# Patient Record
Sex: Male | Born: 1940 | Race: Black or African American | Hispanic: No | Smoking: Never smoker
Health system: Southern US, Community
[De-identification: ages and names within clinical notes are randomized; demographics above are authoritative.]

## PROBLEM LIST (undated history)

## (undated) DIAGNOSIS — R2689 Other abnormalities of gait and mobility: Secondary | ICD-10-CM

## (undated) DIAGNOSIS — I639 Cerebral infarction, unspecified: Secondary | ICD-10-CM

## (undated) DIAGNOSIS — E1140 Type 2 diabetes mellitus with diabetic neuropathy, unspecified: Secondary | ICD-10-CM

## (undated) DIAGNOSIS — Z8739 Personal history of other diseases of the musculoskeletal system and connective tissue: Secondary | ICD-10-CM

## (undated) DIAGNOSIS — I358 Other nonrheumatic aortic valve disorders: Secondary | ICD-10-CM

## (undated) DIAGNOSIS — R519 Headache, unspecified: Secondary | ICD-10-CM

## (undated) DIAGNOSIS — E119 Type 2 diabetes mellitus without complications: Secondary | ICD-10-CM

## (undated) DIAGNOSIS — E7800 Pure hypercholesterolemia, unspecified: Secondary | ICD-10-CM

## (undated) DIAGNOSIS — M1990 Unspecified osteoarthritis, unspecified site: Secondary | ICD-10-CM

## (undated) DIAGNOSIS — H409 Unspecified glaucoma: Secondary | ICD-10-CM

## (undated) DIAGNOSIS — K5730 Diverticulosis of large intestine without perforation or abscess without bleeding: Secondary | ICD-10-CM

## (undated) DIAGNOSIS — K5900 Constipation, unspecified: Secondary | ICD-10-CM

## (undated) DIAGNOSIS — H3530 Unspecified macular degeneration: Secondary | ICD-10-CM

## (undated) DIAGNOSIS — M169 Osteoarthritis of hip, unspecified: Secondary | ICD-10-CM

## (undated) DIAGNOSIS — R51 Headache: Secondary | ICD-10-CM

## (undated) HISTORY — PX: CATARACT EXTRACTION, BILATERAL: SHX1313

## (undated) HISTORY — PX: BACK SURGERY: SHX140

## (undated) HISTORY — PX: LAPAROSCOPIC CHOLECYSTECTOMY: SUR755

## (undated) HISTORY — PX: LUMBAR DISC SURGERY: SHX700

## (undated) HISTORY — PX: APPENDECTOMY: SHX54

## (undated) HISTORY — PX: JOINT REPLACEMENT: SHX530

---

## 2004-11-22 ENCOUNTER — Ambulatory Visit: Payer: Self-pay | Admitting: General Surgery

## 2006-01-27 ENCOUNTER — Ambulatory Visit: Payer: Self-pay | Admitting: General Surgery

## 2006-11-24 ENCOUNTER — Ambulatory Visit: Payer: Self-pay | Admitting: Urology

## 2006-11-24 ENCOUNTER — Other Ambulatory Visit: Payer: Self-pay

## 2006-12-07 ENCOUNTER — Ambulatory Visit: Payer: Self-pay | Admitting: Urology

## 2007-11-26 ENCOUNTER — Ambulatory Visit: Payer: Self-pay | Admitting: Family Medicine

## 2008-11-28 ENCOUNTER — Ambulatory Visit: Payer: Self-pay | Admitting: Gastroenterology

## 2010-12-04 ENCOUNTER — Ambulatory Visit: Payer: Self-pay | Admitting: Family Medicine

## 2013-01-11 ENCOUNTER — Ambulatory Visit: Payer: Self-pay | Admitting: Urology

## 2013-06-23 ENCOUNTER — Ambulatory Visit: Payer: Self-pay | Admitting: Family Medicine

## 2013-09-12 ENCOUNTER — Ambulatory Visit: Payer: Self-pay | Admitting: Gastroenterology

## 2015-04-02 ENCOUNTER — Encounter: Payer: Self-pay | Admitting: *Deleted

## 2015-04-09 ENCOUNTER — Ambulatory Visit: Payer: Self-pay | Admitting: General Surgery

## 2015-05-09 ENCOUNTER — Encounter: Payer: Self-pay | Admitting: *Deleted

## 2015-06-18 ENCOUNTER — Telehealth: Payer: Self-pay | Admitting: Emergency Medicine

## 2015-06-18 MED ORDER — METRONIDAZOLE 500 MG PO TABS: 500.0000 mg | ORAL_TABLET | Freq: Two times a day (BID) | ORAL | Status: DC

## 2015-06-18 NOTE — Telephone Encounter (Signed)
Would like refill on Metronidazole. Having symptoms again. Would like to know if he can get more than 2 day supply.  [facility] [place]

## 2015-06-18 NOTE — Telephone Encounter (Signed)
Called patient back to inform him script has been called in for Metronodizole at [facility]

## 2015-06-18 NOTE — Telephone Encounter (Signed)
ok 

## 2015-08-01 ENCOUNTER — Encounter: Payer: Self-pay | Admitting: Family Medicine

## 2015-08-01 ENCOUNTER — Ambulatory Visit (INDEPENDENT_AMBULATORY_CARE_PROVIDER_SITE_OTHER): Payer: Commercial Managed Care - HMO | Admitting: Family Medicine

## 2015-08-01 VITALS — BP 130/80 | HR 69 | Temp 97.9°F | Resp 18 | Ht 76.0 in | Wt 228.3 lb

## 2015-08-01 DIAGNOSIS — S39011D Strain of muscle, fascia and tendon of abdomen, subsequent encounter: Secondary | ICD-10-CM | POA: Diagnosis not present

## 2015-08-01 DIAGNOSIS — S39011A Strain of muscle, fascia and tendon of abdomen, initial encounter: Secondary | ICD-10-CM | POA: Insufficient documentation

## 2015-08-01 MED ORDER — TRAMADOL HCL 50 MG PO TABS: 50.0000 mg | ORAL_TABLET | Freq: Three times a day (TID) | ORAL | Status: DC | PRN

## 2015-08-01 NOTE — Progress Notes (Signed)
Name: Harold Sandoval   MRN: [id number]    DOB: March 22, 1941   Date:08/01/2015       Progress Note  Subjective  Chief Complaint  Chief Complaint  Patient presents with  . Abdominal Pain    right side , would like something for pain    HPI  Patient has a long-standing history of right sided abdominal pain. He has been worked up extensively for this problem in the past with no findings except perhaps rectus muscle discomfort. He's been given tramadol and Norco in the past for that and wishes to have refill for same as he is about to go on a trip to the beach.  NSAIDs and Tylenol have not been effective believe his symptomatology is exactly the same as in the past. Workup has included a colonoscopy in 2009 which was unremarkable. He had been working extensively on 3 different yards of the last few days that may have precipitated the recurrence of this rectus muscle strain.  History reviewed. No pertinent past medical history.  Social History  Substance Use Topics  . Smoking status: Never Smoker   . Smokeless tobacco: Not on file  . Alcohol Use: No     Current outpatient prescriptions:  .  AZOPT 1 % ophthalmic suspension, instill 1 drop into both eyes twice a day, Disp: , Rfl: 0 .  brimonidine (ALPHAGAN) 0.2 % ophthalmic solution, , Disp: , Rfl: 0 .  latanoprost (XALATAN) 0.005 % ophthalmic solution, , Disp: , Rfl: 0  No Known Allergies  Review of Systems  Constitutional: Negative for fever, chills and weight loss.  Gastrointestinal: Positive for abdominal pain. Negative for heartburn, nausea, diarrhea, constipation, blood in stool and melena.     Objective  Filed Vitals:   08/01/15 0953  BP: 130/80  Pulse: 69  Temp: 97.9 F (36.6 C)  TempSrc: Oral  Resp: 18  Height:  (1.93 m)  Weight: 228 lb 4.8 oz (103.556 kg)  SpO2: 96%     Physical Exam  Constitutional: He is well-developed, well-nourished, and in no distress.  HENT:  Head: Normocephalic.  Eyes: Pupils  are equal, round, and reactive to light.  Cardiovascular: Normal rate.   Pulmonary/Chest: Effort normal.  Abdominal: Soft. Bowel sounds are normal. Tenderness: tender along the lateral aspect of the right rectus muscle reproduce the patient's pain.      Assessment & Plan   1. Strain of rectus abdominis muscle, subsequent encounter  - traMADol (ULTRAM) 50 MG tablet; Take 1 tablet (50 mg total) by mouth every 8 (eight) hours as needed.  Dispense: 30 tablet; Refill: 0

## 2015-09-25 ENCOUNTER — Ambulatory Visit (INDEPENDENT_AMBULATORY_CARE_PROVIDER_SITE_OTHER): Payer: Commercial Managed Care - HMO | Admitting: Family Medicine

## 2015-09-25 ENCOUNTER — Encounter: Payer: Self-pay | Admitting: Family Medicine

## 2015-09-25 VITALS — BP 118/78 | HR 127 | Temp 97.4°F | Resp 16 | Ht 76.0 in | Wt 226.3 lb

## 2015-09-25 DIAGNOSIS — J329 Chronic sinusitis, unspecified: Secondary | ICD-10-CM | POA: Diagnosis not present

## 2015-09-25 DIAGNOSIS — J40 Bronchitis, not specified as acute or chronic: Secondary | ICD-10-CM

## 2015-09-25 MED ORDER — FLUTICASONE PROPIONATE 50 MCG/ACT NA SUSP: 2.0000 | Freq: Every day | NASAL | Status: DC

## 2015-09-25 MED ORDER — AMOXICILLIN-POT CLAVULANATE 875-125 MG PO TABS: 1.0000 | ORAL_TABLET | Freq: Two times a day (BID) | ORAL | Status: DC

## 2015-09-25 MED ORDER — PREDNISONE 20 MG PO TABS: 20.0000 mg | ORAL_TABLET | Freq: Every day | ORAL | Status: DC

## 2015-09-25 NOTE — Progress Notes (Signed)
Name: Harold Sandoval   MRN: [id number]    DOB: [birth year]   Date:09/25/2015       Progress Note  Subjective  Chief Complaint  Chief Complaint  Patient presents with  . Sinus Problem    pt having sinus issues for 4 days  . Wheezing    and chest congestion    HPI  Sinusitis  Patient presents with greater than 7 day history of nasal congestion and drainage which is purulent in color. There is tenderness over the sinuses. There has been fever to nothing along with some associated chills on occasion as well as night sweats. Usage of over-the-counter medications is not been affected. There is also accompanying cough productive of purulent sputum.  Bronchitis  Patient presents with a greater than 1 week history of cough productive of purulent sputum. The cough is irritating and keep the patient awake at night. There has no fever he has has chills chills.  Over-the-counter meds are not completely effective.     History reviewed. No pertinent past medical history.  Social History  Substance Use Topics  . Smoking status: Never Smoker   . Smokeless tobacco: Not on file  . Alcohol Use: No     Current outpatient prescriptions:  .  AZOPT 1 % ophthalmic suspension, instill 1 drop into both eyes twice a day, Disp: , Rfl: 0 .  brimonidine (ALPHAGAN) 0.2 % ophthalmic solution, , Disp: , Rfl: 0 .  latanoprost (XALATAN) 0.005 % ophthalmic solution, , Disp: , Rfl: 0  No Known Allergies  Review of Systems  Constitutional: Positive for chills and malaise/fatigue. Negative for fever and weight loss.  HENT: Positive for congestion. Negative for hearing loss, sore throat and tinnitus.        Sinus pressure and pain  Eyes: Negative for blurred vision, double vision and redness.  Respiratory: Positive for cough and shortness of breath. Negative for hemoptysis.   Cardiovascular: Negative for chest pain, palpitations, orthopnea, claudication and leg swelling.  Gastrointestinal: Negative for  heartburn, nausea, vomiting, diarrhea, constipation and blood in stool.  Genitourinary: Negative for dysuria, urgency, frequency and hematuria.  Musculoskeletal: Negative for myalgias, back pain, joint pain, falls and neck pain.  Skin: Negative for itching.  Neurological: Negative for dizziness, tingling, tremors, focal weakness, seizures, loss of consciousness, weakness and headaches.  Endo/Heme/Allergies: Does not bruise/bleed easily.  Psychiatric/Behavioral: Negative for depression and substance abuse. The patient is not nervous/anxious and does not have insomnia.      Objective  Filed Vitals:   09/25/15 1324  BP: 118/78  Pulse: 127  Temp: 97.4 F (36.3 C)  Resp: 16  Height: 6\' 4"  (1.93 m)  Weight: 226 lb 5 oz (102.655 kg)  SpO2: 96%     Physical Exam  Constitutional: He is oriented to person, place, and time and well-developed, well-nourished, and in no distress.  Patient in mild distress  HENT:  Head: Normocephalic.  Tenderness over the frontal and maxillary sinuses. Nasal turbinates are swollen with clear discharge.  Eyes: EOM are normal. Pupils are equal, round, and reactive to light.  Neck: Normal range of motion. Neck supple. No thyromegaly present.  Cardiovascular: Normal rate, regular rhythm and normal heart sounds.   No murmur heard. Pulmonary/Chest: Effort normal and breath sounds normal. No respiratory distress. He has no wheezes.  Musculoskeletal: Normal range of motion. He exhibits no edema.  Lymphadenopathy:    He has no cervical adenopathy.  Neurological: He is alert and oriented to person, place, and time.  No cranial nerve deficit. Gait normal. Coordination normal.  Skin: Skin is warm and dry. No rash noted.  Psychiatric: Affect and judgment normal.      Assessment & Plan  1. Bronchitis With accompanying sweats and chills - predniSONE (DELTASONE) 20 MG tablet; Take 1 tablet (20 mg total) by mouth daily with breakfast.  Dispense: 10 tablet; Refill:  0 - amoxicillin-clavulanate (AUGMENTIN) 875-125 MG tablet; Take 1 tablet by mouth 2 (two) times daily.  Dispense: 20 tablet; Refill: 0 - fluticasone (FLONASE) 50 MCG/ACT nasal spray; Place 2 sprays into both nostrils daily.  Dispense: 16 g; Refill: 6  2. Sinusitis, unspecified chronicity, unspecified location With accompanying sweats and chills - predniSONE (DELTASONE) 20 MG tablet; Take 1 tablet (20 mg total) by mouth daily with breakfast.  Dispense: 10 tablet; Refill: 0 - amoxicillin-clavulanate (AUGMENTIN) 875-125 MG tablet; Take 1 tablet by mouth 2 (two) times daily.  Dispense: 20 tablet; Refill: 0 - fluticasone (FLONASE) 50 MCG/ACT nasal spray; Place 2 sprays into both nostrils daily.  Dispense: 16 g; Refill: 6

## 2015-10-15 ENCOUNTER — Telehealth: Payer: Self-pay | Admitting: Family Medicine

## 2015-10-15 DIAGNOSIS — J40 Bronchitis, not specified as acute or chronic: Secondary | ICD-10-CM

## 2015-10-15 DIAGNOSIS — J329 Chronic sinusitis, unspecified: Secondary | ICD-10-CM

## 2015-10-15 NOTE — Telephone Encounter (Signed)
Sinuses have not cleared up from the other week and would like to know if you are able to call in another round of antibotics to his pharmacy. Tried to schedule him an appointment today but [place]. Also offered him appointment for tomorrow but he wanted me to ask you this first.

## 2015-10-16 ENCOUNTER — Telehealth: Payer: Self-pay

## 2015-10-16 ENCOUNTER — Encounter: Payer: Self-pay | Admitting: Family Medicine

## 2015-10-16 NOTE — Telephone Encounter (Signed)
Augmentin 875 twice a day for 10 days

## 2015-10-16 NOTE — Telephone Encounter (Signed)
Called pt and notified him that I have obtained [facility] for him to see Dr. Allyne GeeSanders.  [place] # [id number]   ICD-10: H35.059 Start - 10/22/15 Expires - 04/19/2016

## 2015-10-17 MED ORDER — AMOXICILLIN-POT CLAVULANATE 875-125 MG PO TABS: 1.0000 | ORAL_TABLET | Freq: Two times a day (BID) | ORAL | Status: DC

## 2015-10-17 NOTE — Telephone Encounter (Signed)
Patient informed and thanks you °

## 2015-10-17 NOTE — Telephone Encounter (Signed)
Medication has been sent to pharmacy.  °

## 2015-10-24 ENCOUNTER — Other Ambulatory Visit: Payer: Self-pay | Admitting: Family Medicine

## 2015-10-24 MED ORDER — METRONIDAZOLE 500 MG PO TABS: 500.0000 mg | ORAL_TABLET | Freq: Two times a day (BID) | ORAL | Status: DC

## 2015-10-24 NOTE — Telephone Encounter (Signed)
Patient called and ask for refill on Metronidazole. Having symptoms again. Scripy sent to Pharmacy

## 2015-11-24 DIAGNOSIS — I639 Cerebral infarction, unspecified: Secondary | ICD-10-CM

## 2015-11-24 HISTORY — DX: Cerebral infarction, unspecified: I63.9

## 2015-12-14 DIAGNOSIS — I358 Other nonrheumatic aortic valve disorders: Secondary | ICD-10-CM | POA: Diagnosis not present

## 2015-12-18 ENCOUNTER — Other Ambulatory Visit: Payer: Self-pay | Admitting: Family Medicine

## 2015-12-27 DIAGNOSIS — I639 Cerebral infarction, unspecified: Secondary | ICD-10-CM | POA: Diagnosis not present

## 2016-01-11 DIAGNOSIS — H1859 Other hereditary corneal dystrophies: Secondary | ICD-10-CM | POA: Diagnosis not present

## 2016-01-11 DIAGNOSIS — H401133 Primary open-angle glaucoma, bilateral, severe stage: Secondary | ICD-10-CM | POA: Diagnosis not present

## 2016-01-11 DIAGNOSIS — Z961 Presence of intraocular lens: Secondary | ICD-10-CM | POA: Diagnosis not present

## 2016-01-15 DIAGNOSIS — I358 Other nonrheumatic aortic valve disorders: Secondary | ICD-10-CM | POA: Diagnosis not present

## 2016-01-15 DIAGNOSIS — E119 Type 2 diabetes mellitus without complications: Secondary | ICD-10-CM | POA: Diagnosis not present

## 2016-01-15 DIAGNOSIS — E785 Hyperlipidemia, unspecified: Secondary | ICD-10-CM | POA: Diagnosis not present

## 2016-01-15 DIAGNOSIS — Z8673 Personal history of transient ischemic attack (TIA), and cerebral infarction without residual deficits: Secondary | ICD-10-CM | POA: Diagnosis not present

## 2016-01-17 DIAGNOSIS — E7800 Pure hypercholesterolemia, unspecified: Secondary | ICD-10-CM | POA: Diagnosis not present

## 2016-01-17 DIAGNOSIS — N529 Male erectile dysfunction, unspecified: Secondary | ICD-10-CM | POA: Diagnosis not present

## 2016-01-17 DIAGNOSIS — E119 Type 2 diabetes mellitus without complications: Secondary | ICD-10-CM | POA: Diagnosis not present

## 2016-01-30 DIAGNOSIS — H43811 Vitreous degeneration, right eye: Secondary | ICD-10-CM | POA: Diagnosis not present

## 2016-01-30 DIAGNOSIS — H353221 Exudative age-related macular degeneration, left eye, with active choroidal neovascularization: Secondary | ICD-10-CM | POA: Diagnosis not present

## 2016-01-30 DIAGNOSIS — H353111 Nonexudative age-related macular degeneration, right eye, early dry stage: Secondary | ICD-10-CM | POA: Diagnosis not present

## 2016-02-01 DIAGNOSIS — E782 Mixed hyperlipidemia: Secondary | ICD-10-CM | POA: Diagnosis not present

## 2016-02-01 DIAGNOSIS — Z6826 Body mass index (BMI) 26.0-26.9, adult: Secondary | ICD-10-CM | POA: Diagnosis not present

## 2016-02-01 DIAGNOSIS — Z8673 Personal history of transient ischemic attack (TIA), and cerebral infarction without residual deficits: Secondary | ICD-10-CM | POA: Diagnosis not present

## 2016-02-01 DIAGNOSIS — Z0000 Encounter for general adult medical examination without abnormal findings: Secondary | ICD-10-CM | POA: Diagnosis not present

## 2016-02-01 DIAGNOSIS — H409 Unspecified glaucoma: Secondary | ICD-10-CM | POA: Diagnosis not present

## 2016-02-08 DIAGNOSIS — I358 Other nonrheumatic aortic valve disorders: Secondary | ICD-10-CM | POA: Diagnosis not present

## 2016-02-08 DIAGNOSIS — Z8673 Personal history of transient ischemic attack (TIA), and cerebral infarction without residual deficits: Secondary | ICD-10-CM | POA: Diagnosis not present

## 2016-04-09 DIAGNOSIS — H43811 Vitreous degeneration, right eye: Secondary | ICD-10-CM | POA: Diagnosis not present

## 2016-04-09 DIAGNOSIS — H353221 Exudative age-related macular degeneration, left eye, with active choroidal neovascularization: Secondary | ICD-10-CM | POA: Diagnosis not present

## 2016-04-09 DIAGNOSIS — H353111 Nonexudative age-related macular degeneration, right eye, early dry stage: Secondary | ICD-10-CM | POA: Diagnosis not present

## 2016-04-10 DIAGNOSIS — E119 Type 2 diabetes mellitus without complications: Secondary | ICD-10-CM | POA: Diagnosis not present

## 2016-04-14 DIAGNOSIS — Z7901 Long term (current) use of anticoagulants: Secondary | ICD-10-CM | POA: Diagnosis not present

## 2016-04-14 DIAGNOSIS — I358 Other nonrheumatic aortic valve disorders: Secondary | ICD-10-CM | POA: Diagnosis not present

## 2016-04-14 DIAGNOSIS — Z8679 Personal history of other diseases of the circulatory system: Secondary | ICD-10-CM | POA: Diagnosis not present

## 2016-04-15 DIAGNOSIS — Z7901 Long term (current) use of anticoagulants: Secondary | ICD-10-CM | POA: Diagnosis not present

## 2016-04-15 DIAGNOSIS — I358 Other nonrheumatic aortic valve disorders: Secondary | ICD-10-CM | POA: Diagnosis not present

## 2016-04-15 DIAGNOSIS — Z8679 Personal history of other diseases of the circulatory system: Secondary | ICD-10-CM | POA: Diagnosis not present

## 2016-04-17 DIAGNOSIS — E7800 Pure hypercholesterolemia, unspecified: Secondary | ICD-10-CM | POA: Diagnosis not present

## 2016-04-17 DIAGNOSIS — Z125 Encounter for screening for malignant neoplasm of prostate: Secondary | ICD-10-CM | POA: Diagnosis not present

## 2016-04-17 DIAGNOSIS — E119 Type 2 diabetes mellitus without complications: Secondary | ICD-10-CM | POA: Diagnosis not present

## 2016-04-17 DIAGNOSIS — N529 Male erectile dysfunction, unspecified: Secondary | ICD-10-CM | POA: Diagnosis not present

## 2016-04-30 DIAGNOSIS — L820 Inflamed seborrheic keratosis: Secondary | ICD-10-CM | POA: Diagnosis not present

## 2016-04-30 DIAGNOSIS — L819 Disorder of pigmentation, unspecified: Secondary | ICD-10-CM | POA: Diagnosis not present

## 2016-05-12 ENCOUNTER — Observation Stay
Admission: EM | Admit: 2016-05-12 | Discharge: 2016-05-13 | Disposition: A | Discharge status: Home / Self Care | Payer: PPO | Attending: Specialist | Admitting: Specialist

## 2016-05-12 ENCOUNTER — Encounter: Payer: Self-pay | Admitting: Emergency Medicine

## 2016-05-12 ENCOUNTER — Emergency Department: Payer: PPO

## 2016-05-12 DIAGNOSIS — Z79899 Other long term (current) drug therapy: Secondary | ICD-10-CM | POA: Insufficient documentation

## 2016-05-12 DIAGNOSIS — Z7901 Long term (current) use of anticoagulants: Secondary | ICD-10-CM | POA: Diagnosis not present

## 2016-05-12 DIAGNOSIS — Z7951 Long term (current) use of inhaled steroids: Secondary | ICD-10-CM | POA: Diagnosis not present

## 2016-05-12 DIAGNOSIS — M542 Cervicalgia: Secondary | ICD-10-CM | POA: Diagnosis present

## 2016-05-12 DIAGNOSIS — R079 Chest pain, unspecified: Secondary | ICD-10-CM

## 2016-05-12 DIAGNOSIS — E119 Type 2 diabetes mellitus without complications: Secondary | ICD-10-CM | POA: Insufficient documentation

## 2016-05-12 DIAGNOSIS — Z8673 Personal history of transient ischemic attack (TIA), and cerebral infarction without residual deficits: Secondary | ICD-10-CM | POA: Insufficient documentation

## 2016-05-12 DIAGNOSIS — Z8679 Personal history of other diseases of the circulatory system: Secondary | ICD-10-CM | POA: Diagnosis not present

## 2016-05-12 DIAGNOSIS — J310 Chronic rhinitis: Secondary | ICD-10-CM | POA: Insufficient documentation

## 2016-05-12 DIAGNOSIS — R200 Anesthesia of skin: Secondary | ICD-10-CM | POA: Diagnosis not present

## 2016-05-12 DIAGNOSIS — R208 Other disturbances of skin sensation: Secondary | ICD-10-CM | POA: Diagnosis present

## 2016-05-12 DIAGNOSIS — R0789 Other chest pain: Principal | ICD-10-CM | POA: Insufficient documentation

## 2016-05-12 DIAGNOSIS — M5414 Radiculopathy, thoracic region: Secondary | ICD-10-CM | POA: Diagnosis present

## 2016-05-12 DIAGNOSIS — H409 Unspecified glaucoma: Secondary | ICD-10-CM | POA: Insufficient documentation

## 2016-05-12 DIAGNOSIS — Z7984 Long term (current) use of oral hypoglycemic drugs: Secondary | ICD-10-CM | POA: Diagnosis not present

## 2016-05-12 DIAGNOSIS — E785 Hyperlipidemia, unspecified: Secondary | ICD-10-CM | POA: Insufficient documentation

## 2016-05-12 DIAGNOSIS — I639 Cerebral infarction, unspecified: Secondary | ICD-10-CM | POA: Diagnosis not present

## 2016-05-12 LAB — CBC
HCT: 48.2 % (ref 40.0–52.0)
Hemoglobin: 16.6 g/dL (ref 13.0–18.0)
MCH: 30.7 pg (ref 26.0–34.0)
MCHC: 34.5 g/dL (ref 32.0–36.0)
MCV: 89 fL (ref 80.0–100.0)
Platelets: 184 10*3/uL (ref 150–440)
RBC: 5.41 MIL/uL (ref 4.40–5.90)
RDW: 13.1 % (ref 11.5–14.5)
WBC: 6.9 10*3/uL (ref 3.8–10.6)

## 2016-05-12 LAB — BASIC METABOLIC PANEL WITH GFR
Anion gap: 6 (ref 5–15)
BUN: 15 mg/dL (ref 6–20)
CO2: 31 mmol/L (ref 22–32)
Calcium: 10 mg/dL (ref 8.9–10.3)
Chloride: 105 mmol/L (ref 101–111)
Creatinine, Ser: 1.09 mg/dL (ref 0.61–1.24)
GFR calc Af Amer: >60 mL/min
GFR calc non Af Amer: >60 mL/min
Glucose, Bld: 108 mg/dL — ABNORMAL HIGH (ref 65–99)
Potassium: 4.1 mmol/L (ref 3.5–5.1)
Sodium: 142 mmol/L (ref 135–145)

## 2016-05-12 LAB — GLUCOSE, CAPILLARY: Glucose-Capillary: 176 mg/dL — ABNORMAL HIGH (ref 65–99)

## 2016-05-12 LAB — TROPONIN I
Troponin I: <0.03 ng/mL
Troponin I: <0.03 ng/mL

## 2016-05-12 MED ORDER — BRIMONIDINE TARTRATE 0.2 % OP SOLN
1.0000 [drp] | Freq: Two times a day (BID) | OPHTHALMIC | Status: DC
  Administered 2016-05-12 – 2016-05-13 (×2): 1 [drp] via OPHTHALMIC
  Filled 2016-05-12: qty 5

## 2016-05-12 MED ORDER — ASPIRIN EC 325 MG PO TBEC
325.0000 mg | DELAYED_RELEASE_TABLET | Freq: Every day | ORAL | Status: DC
  Administered 2016-05-13: 325 mg via ORAL
  Filled 2016-05-12: qty 1

## 2016-05-12 MED ORDER — BRINZOLAMIDE 1 % OP SUSP
1.0000 [drp] | Freq: Two times a day (BID) | OPHTHALMIC | Status: DC
  Administered 2016-05-12 – 2016-05-13 (×2): 1 [drp] via OPHTHALMIC
  Filled 2016-05-12: qty 10

## 2016-05-12 MED ORDER — ACETAMINOPHEN 325 MG PO TABS: 650.0000 mg | ORAL_TABLET | ORAL | Status: DC | PRN

## 2016-05-12 MED ORDER — INSULIN ASPART 100 UNIT/ML ~~LOC~~ SOLN
0.0000 [IU] | Freq: Three times a day (TID) | SUBCUTANEOUS | Status: DC
  Filled 2016-05-12: qty 3

## 2016-05-12 MED ORDER — LATANOPROST 0.005 % OP SOLN
1.0000 [drp] | Freq: Every day | OPHTHALMIC | Status: DC
  Administered 2016-05-12: 1 [drp] via OPHTHALMIC
  Filled 2016-05-12: qty 2.5

## 2016-05-12 MED ORDER — INSULIN ASPART 100 UNIT/ML ~~LOC~~ SOLN: 0.0000 [IU] | Freq: Every day | SUBCUTANEOUS | Status: DC

## 2016-05-12 MED ORDER — APIXABAN 5 MG PO TABS
5.0000 mg | ORAL_TABLET | Freq: Two times a day (BID) | ORAL | Status: DC
  Administered 2016-05-12 – 2016-05-13 (×2): 5 mg via ORAL
  Filled 2016-05-12 (×2): qty 1

## 2016-05-12 MED ORDER — ATORVASTATIN CALCIUM 20 MG PO TABS
80.0000 mg | ORAL_TABLET | Freq: Every day | ORAL | Status: DC
  Administered 2016-05-12: 80 mg via ORAL
  Filled 2016-05-12: qty 4

## 2016-05-12 MED ORDER — ALPRAZOLAM 0.25 MG PO TABS: 0.2500 mg | ORAL_TABLET | Freq: Two times a day (BID) | ORAL | Status: DC | PRN

## 2016-05-12 MED ORDER — GI COCKTAIL ~~LOC~~
30.0000 mL | Freq: Four times a day (QID) | ORAL | Status: DC | PRN
  Filled 2016-05-12: qty 30

## 2016-05-12 MED ORDER — MORPHINE SULFATE (PF) 2 MG/ML IV SOLN: 2.0000 mg | INTRAVENOUS | Status: DC | PRN

## 2016-05-12 MED ORDER — ONDANSETRON HCL 4 MG/2ML IJ SOLN: 4.0000 mg | Freq: Four times a day (QID) | INTRAMUSCULAR | Status: DC | PRN

## 2016-05-12 MED ORDER — METOPROLOL TARTRATE 25 MG PO TABS
25.0000 mg | ORAL_TABLET | Freq: Two times a day (BID) | ORAL | Status: DC
  Administered 2016-05-12 – 2016-05-13 (×2): 25 mg via ORAL
  Filled 2016-05-12 (×2): qty 1

## 2016-05-12 MED ORDER — FLUTICASONE PROPIONATE 50 MCG/ACT NA SUSP
2.0000 | Freq: Every day | NASAL | Status: DC | PRN
  Filled 2016-05-12: qty 16

## 2016-05-12 NOTE — ED Notes (Signed)
Pt having off and on left sided cp, states pain radiates to left arm and left neck occasionally as well. Denies cardiac hx. Did have a stroke in December.

## 2016-05-12 NOTE — ED Provider Notes (Signed)
[facility] Emergency Department Provider Note  ____________________________________________  Time seen: Approximately 4:10 PM  I have reviewed the triage vital signs and the nursing notes.   HISTORY  Chief Complaint No chief complaint on file.    HPI Harold Sandoval is a 75 y.o. 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 The patient reports that since his stroke in December, he is having 2-3 episodes daily of a left-sided chest pain just below the nipple that is "sharp and dull." The episodes can occur during the day or at night, at rest, and are not related to positional changes or food. Sometimes the episodes are brief lasting a split-second, and sometimes they last for several hours. He does not have any associated lightheadedness, syncope, palpitations, shortness of breath, diaphoresis or nausea or vomiting. Last night, the patient had a similar episode that that it continued throughout the night and was associated with a pain that went from his neck to his shoulder on the left side and numbness in the left hand. At this time, the patient is symptom-free.   Past Medical History  Diagnosis Date  . Stroke (HCC)   . Diabetes mellitus without complication [facility](HCC)     Patient Active Problem List   Diagnosis Date Noted  . Strain of rectus abdominis muscle 08/01/2015    Past Surgical History  Procedure Laterality Date  . Back surgery    . Appendectomy    . Gallbladder surgery      Current Outpatient Rx  Name  Route  Sig  Dispense  Refill  . amoxicillin-clavulanate (AUGMENTIN) 875-125 MG tablet   Oral   Take 1 tablet by mouth 2 (two) times daily.   20 tablet   0   . AZOPT 1 % ophthalmic suspension      instill 1 drop into both eyes twice a day      0     Dispense as written.   . brimonidine (ALPHAGAN) 0.2 % ophthalmic solution            0   . desonide (DESOWEN) 0.05 % lotion      apply to affected area twice a day   30 mL   1   . fluticasone (FLONASE) 50 MCG/ACT nasal spray   Each Nare   Place 2 sprays into both nostrils daily.   16 g   6   . latanoprost (XALATAN) 0.005 % ophthalmic solution            0   . metroNIDAZOLE (FLAGYL) 500 MG tablet   Oral   Take 1 tablet (500 mg total) by mouth 2 (two) times daily.   14 tablet   0   . predniSONE (DELTASONE) 20 MG tablet   Oral   Take 1 tablet (20 mg total) by mouth daily with breakfast.   10 tablet   0     Allergies Review of patient's allergies indicates no known allergies.  No family history on file.  Social History Social History  Substance Use Topics  . Smoking status: Never Smoker   . Smokeless tobacco: None  . Alcohol Use: No    Review of Systems Constitutional: No fever/chills.  No lightheadedness or sob. Eyes: No visual changes.  No blurred or double vision.. ENT: No sore throat. No congestion or rhinorrhea. Cardiovascular: Positive chest pain. Denies palpitations. Respiratory: Denies shortness of breath.  No cough. Gastrointestinal: No abdominal pain.  No nausea, no vomiting.  No  diarrhea.  No constipation. Genitourinary: Negative for dysuria. Musculoskeletal: Negative for back pain.  No LE swelling Skin: Negative for rash. Neurological: Negative for headaches. No focal numbness, tingling or weakness.   10-point ROS otherwise negative.  ____________________________________________   PHYSICAL EXAM:  VITAL SIGNS: ED Triage Vitals  Enc Vitals Group     BP 05/12/16 1452 148/82 mmHg     Pulse Rate 05/12/16 1452 94     Resp 05/12/16 1452 18     Temp 05/12/16 1452 97.9 F (36.6 C)     Temp Source 05/12/16 1452 Oral     SpO2 05/12/16 1452 98 %     Weight 05/12/16 1452 215 lb (97.523 kg)     Height 05/12/16 1452 6\' 4"  (1.93 m)     Head Cir --      Peak Flow --      Pain Score 05/12/16 1452 0     Pain Loc --      Pain Edu? --      Excl. in GC? --     Constitutional: Alert and oriented. Well appearing  and in no acute distress. Answers questions appropriately. Eyes: Conjunctivae are normal.  EOMI. No scleral icterus. Head: Atraumatic. Nose: No congestion/rhinnorhea. Mouth/Throat: Mucous membranes are moist.  Neck: No stridor.  Supple. No JVD.  Cardiovascular: Normal rate, regular rhythm. No murmurs, rubs or gallops.  Respiratory: Normal respiratory effort.  No accessory muscle use or retractions. Lungs CTAB.  No wheezes, rales or ronchi. Gastrointestinal: Soft, nontender and nondistended.  No guarding or rebound.  No peritoneal signs. Musculoskeletal: No LE edema. No ttp in the calves or palpable cords.  Negative Homan's sign. Neurologic:  A&Ox3.  Speech is clear.  Face and smile are symmetric.  EOMI.  Moves all extremities well. Skin:  Skin is warm, dry and intact. No rash noted. Psychiatric: Mood and affect are normal. Speech and behavior are normal.  Normal judgement.  ____________________________________________   LABS (all labs ordered are listed, but only abnormal results are displayed)  Labs Reviewed  BASIC METABOLIC PANEL - Abnormal; Notable for the following:    Glucose, Bld 108 (*)    All other components within normal limits  CBC  TROPONIN I   ____________________________________________  EKG  ED ECG REPORT I, Rockne Menghini, the attending physician, personally viewed and interpreted this ECG.   Date: 05/12/2016  EKG Time: 1448  Rate: 92  Rhythm: normal sinus rhythm; PAC's  Axis: Normal  Intervals:none  ST&T Change: No ST elevation.  ____________________________________________  RADIOLOGY  Dg Chest 2 View  05/12/2016  CLINICAL DATA:  Intermittent left-sided chest pain over the past few weeks. EXAM: CHEST  2 VIEW COMPARISON:  None. FINDINGS: A cardiac silhouette, mediastinal and hilar contours are within normal limits. Mild tortuosity of the thoracic aorta. The lungs are clear of acute process. No pleural effusion or pulmonary edema. Bilateral nipple  shadows are noted. The bony thorax is intact. IMPRESSION: No acute cardiopulmonary findings. Electronically Signed   By: Rudie Meyer M.D.   On: 05/12/2016 15:15    ____________________________________________   PROCEDURES  Procedure(s) performed: None  Critical Care performed: No ____________________________________________   INITIAL IMPRESSION / ASSESSMENT AND PLAN / ED COURSE  Pertinent labs & imaging results that were available during my care of the patient were reviewed by me and considered in my medical decision making (see chart for details).  75 y.o. w/ a hx of recent CVA presenting w/ 2-3 daily episodes of CP, worse overnight.  The patient has never had any risk stratification studies. There are several possible causes for his pain, but he does need cardiac evaluation given his multiple risk factors. We'll plan to admit him to the hospital, for further evaluation and treatment.  ____________________________________________  FINAL CLINICAL IMPRESSION(S) / ED DIAGNOSES  Final diagnoses:  Chest pain, unspecified chest pain type  Numbness of left hand  Neck pain      NEW MEDICATIONS STARTED DURING THIS VISIT:  New Prescriptions   No medications on file     Rockne Menghini, MD 05/12/16 1616

## 2016-05-12 NOTE — Progress Notes (Signed)
Patient arrived to 2A Room 233. Patient denies pain and all questions answered. Patient oriented to unit and Fall Safety Plan signed. Skin assessment completed with Richardo PriestMarcel RN and skin intact. A&Ox4, VSS, and NSR on verified tele box #[id number]. Patient has requested that no one other than his significant other know about his diabetes. Nursing staff will continue to monitor. Lamonte RicherKara A Draven Laine, RN

## 2016-05-12 NOTE — ED Notes (Signed)
Pt reports he had a stroke in December but since then has had chest pain off and on daily, the length of the chest pain varies - Pt states last night the chest pain lasted all night and he had a numb feeling in his left hand and left side of neck into shoulder - Pt reports he was suppose to have a stress test that he had not scheduled so Dr Sharma CovertNorman suspects he will be admitted

## 2016-05-12 NOTE — H&P (Signed)
[facility] [place] [facility]   PATIENT NAME: Harold Sandoval    MR#:  [id number]  DATE OF BIRTH:  10/15/1941  DATE OF ADMISSION:  05/12/2016  PRIMARY CARE PHYSICIAN: Leotis Shames, MD   REQUESTING/REFERRING PHYSICIAN: Rockne Menghini, MD  CHIEF COMPLAINT:  No chief complaint on file.  Worsening chest pain for 4-5 days. HISTORY OF PRESENT ILLNESS:  Pesach Frisch  is a 75 y.o. male with a known history of Diabetes and recent stroke. The patient presents to the ED with chest pain for the past 4-5 days. The patient had stroke last December and has been treated wit eliquis since that time. The patient has had the chest pain on and off since last December. The chest pain has been worsening for the past 4-5 days, which is since substernal area, sharp and dull, with radiation to the left side of neck and arm. The patient denies any other symptoms. Troponin level is normal. Since the patient never had cardiac workup, Dr. Sharma Covert requested for admission.  PAST MEDICAL HISTORY:   Past Medical History  Diagnosis Date  . Stroke (HCC)   . Diabetes mellitus without complication (HCC)     PAST SURGICAL HISTORY:   Past Surgical History  Procedure Laterality Date  . Back surgery    . Appendectomy    . Gallbladder surgery      SOCIAL HISTORY:   Social History  Substance Use Topics  . Smoking status: Never Smoker   . Smokeless tobacco: Not on file  . Alcohol Use: No    FAMILY HISTORY:   Family History  Problem Relation Age of Onset  . Cancer Mother   . Cancer Father     DRUG ALLERGIES:  No Known Allergies  REVIEW OF SYSTEMS:  CONSTITUTIONAL: No fever, fatigue or weakness.  EYES: No blurred or double vision.  EARS, NOSE, AND THROAT: No tinnitus or ear pain.  RESPIRATORY: No cough, shortness of breath, wheezing or hemoptysis.  CARDIOVASCULAR: Has chest pain, no orthopnea, edema.  GASTROINTESTINAL: No nausea, vomiting, diarrhea or abdominal pain.   GENITOURINARY: No dysuria, hematuria.  ENDOCRINE: No polyuria, nocturia,  HEMATOLOGY: No anemia, easy bruising or bleeding SKIN: No rash or lesion. MUSCULOSKELETAL: No joint pain or arthritis.   NEUROLOGIC: No tingling, numbness, weakness.  PSYCHIATRY: No anxiety or depression.   MEDICATIONS AT HOME:   Prior to Admission medications   Medication Sig Start Date End Date Taking? Authorizing Provider  acetaminophen (TYLENOL) 500 MG tablet Take 1,500 mg by mouth every 8 (eight) hours as needed for mild pain or headache.   Yes Historical Provider, MD  apixaban (ELIQUIS) 5 MG TABS tablet Take 5 mg by mouth 2 (two) times daily.   Yes Historical Provider, MD  atorvastatin (LIPITOR) 80 MG tablet Take 80 mg by mouth at bedtime.   Yes Historical Provider, MD  brimonidine (ALPHAGAN) 0.2 % ophthalmic solution Place 1 drop into both eyes 2 (two) times daily.    Yes Historical Provider, MD  brinzolamide (AZOPT) 1 % ophthalmic suspension Place 1 drop into both eyes 2 (two) times daily.   Yes Historical Provider, MD  desonide (DESOWEN) 0.05 % lotion Apply 1 application topically 2 (two) times daily.   Yes Historical Provider, MD  fluticasone (FLONASE) 50 MCG/ACT nasal spray Place 2 sprays into both nostrils daily as needed for rhinitis.   Yes Historical Provider, MD  latanoprost (XALATAN) 0.005 % ophthalmic solution Place 1 drop into both eyes at bedtime.    Yes Historical  Provider, MD  metFORMIN (GLUCOPHAGE-XR) 500 MG 24 hr tablet Take 500 mg by mouth daily with supper.   Yes Historical Provider, MD      VITAL SIGNS:  Blood pressure 148/82, pulse 94, temperature 97.9 F (36.6 C), temperature source Oral, resp. rate 18, height 6\' 4"  (1.93 m), weight 215 lb (97.523 kg), SpO2 98 %.  PHYSICAL EXAMINATION:  GENERAL:  75 y.o.-year-old patient lying in the bed with no acute distress.  EYES: Pupils equal, round, reactive to light and accommodation. No scleral icterus. Extraocular muscles intact.  HEENT:  Head atraumatic, normocephalic. Oropharynx and nasopharynx clear. Moist oral mucosa. NECK:  Supple, no jugular venous distention. No thyroid enlargement, no tenderness.  LUNGS: Normal breath sounds bilaterally, no wheezing, rales,rhonchi or crepitation. No use of accessory muscles of respiration.  CARDIOVASCULAR: S1, S2 normal. No murmurs, rubs, or gallops.  ABDOMEN: Soft, nontender, nondistended. Bowel sounds present. No organomegaly or mass.  EXTREMITIES: No pedal edema, cyanosis, or clubbing.  NEUROLOGIC: Cranial nerves II through XII are intact. Muscle strength 5/5 in all extremities. Sensation intact. Gait not checked.  PSYCHIATRIC: The patient is alert and oriented x 3.  SKIN: No obvious rash, lesion, or ulcer.   LABORATORY PANEL:   CBC  Recent Labs Lab 05/12/16 1458  WBC 6.9  HGB 16.6  HCT 48.2  PLT 184   ------------------------------------------------------------------------------------------------------------------  Chemistries   Recent Labs Lab 05/12/16 1458  NA 142  K 4.1  CL 105  CO2 31  GLUCOSE 108*  BUN 15  CREATININE 1.09  CALCIUM 10.0   ------------------------------------------------------------------------------------------------------------------  Cardiac Enzymes  Recent Labs Lab 05/12/16 1458  TROPONINI <0.03   ------------------------------------------------------------------------------------------------------------------  RADIOLOGY:  Dg Chest 2 View  05/12/2016  CLINICAL DATA:  Intermittent left-sided chest pain over the past few weeks. EXAM: CHEST  2 VIEW COMPARISON:  None. FINDINGS: A cardiac silhouette, mediastinal and hilar contours are within normal limits. Mild tortuosity of the thoracic aorta. The lungs are clear of acute process. No pleural effusion or pulmonary edema. Bilateral nipple shadows are noted. The bony thorax is intact. IMPRESSION: No acute cardiopulmonary findings. Electronically Signed   By: Rudie MeyerP.  Gallerani M.D.   On:  05/12/2016 15:15    EKG:   Orders placed or performed during the hospital encounter of 05/12/16  . EKG 12-Lead  . EKG 12-Lead  . ED EKG within 10 minutes  . ED EKG within 10 minutes    IMPRESSION AND PLAN:   Chest pain, need to rule out CAD. Continue eliquis, Start Lopressor, follow-up troponin level and a stress test.  History of CVA, continue eliquis and statin.  Diabetes. Start sliding scale.  All the records are reviewed and case discussed with ED provider. Management plans discussed with the patient, His wife and they are in agreement.  CODE STATUS: Full code  TOTAL TIME TAKING CARE OF THIS PATIENT: 47 minutes.    Shaune Pollackhen, Conda Wannamaker M.D on 05/12/2016 at 5:01 PM  Between 7am to 6pm - Pager - [phone]  After 6pm go to [email or web address] - password [email or web address] [email or web address]  [facility]  Office  [phone]  CC: Primary care physician; Leotis ShamesSingh,Jasmine, MD

## 2016-05-13 ENCOUNTER — Observation Stay: Payer: PPO

## 2016-05-13 DIAGNOSIS — I639 Cerebral infarction, unspecified: Secondary | ICD-10-CM | POA: Diagnosis not present

## 2016-05-13 DIAGNOSIS — R079 Chest pain, unspecified: Secondary | ICD-10-CM | POA: Diagnosis not present

## 2016-05-13 DIAGNOSIS — H409 Unspecified glaucoma: Secondary | ICD-10-CM | POA: Diagnosis not present

## 2016-05-13 DIAGNOSIS — E119 Type 2 diabetes mellitus without complications: Secondary | ICD-10-CM | POA: Diagnosis not present

## 2016-05-13 LAB — HEMOGLOBIN A1C: Hgb A1c MFr Bld: 6.7 % — ABNORMAL HIGH (ref 4.0–6.0)

## 2016-05-13 LAB — NM MYOCAR MULTI W/SPECT W/WALL MOTION / EF
Estimated workload: 1 METS
Exercise duration (min): 1 min
Exercise duration (sec): 2 s
LV dias vol: 104 mL (ref 62–150)
LV sys vol: 53 mL
MPHR: 144 {beats}/min
Peak HR: 92 {beats}/min
Percent HR: 63 %
Rest HR: 64 {beats}/min
SDS: 0
SRS: 0
SSS: 0
TID: 1.05

## 2016-05-13 LAB — GLUCOSE, CAPILLARY
Glucose-Capillary: 105 mg/dL — ABNORMAL HIGH (ref 65–99)
Glucose-Capillary: 95 mg/dL (ref 65–99)

## 2016-05-13 LAB — TROPONIN I: Troponin I: <0.03 ng/mL

## 2016-05-13 MED ORDER — TECHNETIUM TC 99M TETROFOSMIN IV KIT
13.3800 | PACK | Freq: Once | INTRAVENOUS | Status: AC | PRN
  Administered 2016-05-13: 13.38 via INTRAVENOUS

## 2016-05-13 MED ORDER — REGADENOSON 0.4 MG/5ML IV SOLN
0.4000 mg | Freq: Once | INTRAVENOUS | Status: AC
  Administered 2016-05-13: 0.4 mg via INTRAVENOUS

## 2016-05-13 MED ORDER — TECHNETIUM TC 99M TETROFOSMIN IV KIT
32.8900 | PACK | Freq: Once | INTRAVENOUS | Status: AC | PRN
  Administered 2016-05-13: 32.89 via INTRAVENOUS

## 2016-05-13 NOTE — Progress Notes (Signed)
Full report to follow but patient has no perfusion defect with left ventricular ejection fraction 35%.

## 2016-05-13 NOTE — Discharge Summary (Signed)
[employer] - [place] at [facility]   PATIENT NAME: Harold Sandoval    MR#:  [id number]  DATE OF BIRTH:  February 19, 1941  DATE OF ADMISSION:  05/12/2016 ADMITTING PHYSICIAN: Shaune Pollack, MD  DATE OF DISCHARGE: 05/13/2016  2:04 PM  PRIMARY CARE PHYSICIAN: Singh,Jasmine, MD    ADMISSION DIAGNOSIS:  Neck pain [M54.2] Numbness of left hand [R20.8] Chest pain, unspecified chest pain type [R07.9]  DISCHARGE DIAGNOSIS:  Active Problems:   Chest pain   SECONDARY DIAGNOSIS:   Past Medical History  Diagnosis Date  . Stroke (HCC)   . Diabetes mellitus without complication [facility])     HOSPITAL COURSE:   75 year old male with past medical history of hypertension, hyperlipidemia, history of previous CVA, glaucoma who presented to the hospital complaining of chest pain.  1. Chest pain-patient was observed overnight on telemetry had 3 sets of cardiac markers checked which were negative. -He underwent a nuclear medicine stress test which was essentially benign without any evidence of wall motion abnormalities but with reduced ejection fraction. -he is currently chest pain-free and hemodynamically stable and therefore being discharged home.  2. History of previous CVA-patient will continue Eliquis  3. Diabetes type 2 without palpation-patient will continue his metformin.  4. Hyperlipidemia-patient will resume his atorvastatin.  5. glaucoma-patient will continue his Alphagan Azopt and Xalatan eyedrops.   DISCHARGE CONDITIONS:   Stable  CONSULTS OBTAINED:     DRUG ALLERGIES:  No Known Allergies  DISCHARGE MEDICATIONS:   Discharge Medication List as of 05/13/2016  1:40 PM    CONTINUE these medications which have NOT CHANGED   Details  acetaminophen (TYLENOL) 500 MG tablet Take 1,500 mg by mouth every 8 (eight) hours as needed for mild pain or headache., Until Discontinued, Historical Med    apixaban (ELIQUIS) 5 MG TABS tablet Take 5 mg by mouth 2 (two) times daily., Until  Discontinued, Historical Med    atorvastatin (LIPITOR) 80 MG tablet Take 80 mg by mouth at bedtime., Until Discontinued, Historical Med    brimonidine (ALPHAGAN) 0.2 % ophthalmic solution Place 1 drop into both eyes 2 (two) times daily. , Until Discontinued, Historical Med    brinzolamide (AZOPT) 1 % ophthalmic suspension Place 1 drop into both eyes 2 (two) times daily., Until Discontinued, Historical Med    desonide (DESOWEN) 0.05 % lotion Apply 1 application topically 2 (two) times daily., Until Discontinued, Historical Med    fluticasone (FLONASE) 50 MCG/ACT nasal spray Place 2 sprays into both nostrils daily as needed for rhinitis., Until Discontinued, Historical Med    latanoprost (XALATAN) 0.005 % ophthalmic solution Place 1 drop into both eyes at bedtime. , Until Discontinued, Historical Med    metFORMIN (GLUCOPHAGE-XR) 500 MG 24 hr tablet Take 500 mg by mouth daily with supper., Until Discontinued, Historical Med         DISCHARGE INSTRUCTIONS:   DIET:  Cardiac diet and Diabetic diet  DISCHARGE CONDITION:  Stable  ACTIVITY:  Activity as tolerated  OXYGEN:  Home Oxygen: No.   Oxygen Delivery: room air  DISCHARGE LOCATION:  home   If you experience worsening of your admission symptoms, develop shortness of breath, life threatening emergency, suicidal or homicidal thoughts you must seek medical attention immediately by calling 911 or calling your MD immediately  if symptoms less severe.  You Must read complete instructions/literature along with all the possible adverse reactions/side effects for all the Medicines you take and that have been prescribed to you. Take any new Medicines after you  have completely understood and accpet all the possible adverse reactions/side effects.   Please note  You were cared for by a hospitalist during your hospital stay. If you have any questions about your discharge medications or the care you received while you were in the hospital  after you are discharged, you can call the unit and asked to speak with the hospitalist on call if the hospitalist that took care of you is not available. Once you are discharged, your primary care physician will handle any further medical issues. Please note that NO REFILLS for any discharge medications will be authorized once you are discharged, as it is imperative that you return to your primary care physician (or establish a relationship with a primary care physician if you do not have one) for your aftercare needs so that they can reassess your need for medications and monitor your lab values.     Today   Chest pain-free. No other complaints presently.  VITAL SIGNS:  Blood pressure 127/83, pulse 63, temperature 97.9 F (36.6 C), temperature source Oral, resp. rate 14, height 6\' 4"  (1.93 m), weight 96.934 kg (213 lb 11.2 oz), SpO2 98 %.  I/O:   Intake/Output Summary (Last 24 hours) at 05/13/16 1603 Last data filed at 05/13/16 0513  Gross per 24 hour  Intake      0 ml  Output      0 ml  Net      0 ml    PHYSICAL EXAMINATION:  GENERAL:  75 y.o.-year-old patient lying in the bed with no acute distress.  EYES: Pupils equal, round, reactive to light and accommodation. No scleral icterus. Extraocular muscles intact.  HEENT: Head atraumatic, normocephalic. Oropharynx and nasopharynx clear.  NECK:  Supple, no jugular venous distention. No thyroid enlargement, no tenderness.  LUNGS: Normal breath sounds bilaterally, no wheezing, rales,rhonchi. No use of accessory muscles of respiration.  CARDIOVASCULAR: S1, S2 normal. No murmurs, rubs, or gallops.  ABDOMEN: Soft, non-tender, non-distended. Bowel sounds present. No organomegaly or mass.  EXTREMITIES: No pedal edema, cyanosis, or clubbing.  NEUROLOGIC: Cranial nerves II through XII are intact. No focal motor or sensory defecits b/l.  PSYCHIATRIC: The patient is alert and oriented x 3. Good affect.  SKIN: No obvious rash, lesion, or ulcer.    DATA REVIEW:   CBC  Recent Labs Lab 05/12/16 1458  WBC 6.9  HGB 16.6  HCT 48.2  PLT 184    Chemistries   Recent Labs Lab 05/12/16 1458  NA 142  K 4.1  CL 105  CO2 31  GLUCOSE 108*  BUN 15  CREATININE 1.09  CALCIUM 10.0    Cardiac Enzymes  Recent Labs Lab 05/12/16 2340  TROPONINI <0.03    Microbiology Results  No results found for this or any previous visit.  RADIOLOGY:  Dg Chest 2 View  05/12/2016  CLINICAL DATA:  Intermittent left-sided chest pain over the past few weeks. EXAM: CHEST  2 VIEW COMPARISON:  None. FINDINGS: A cardiac silhouette, mediastinal and hilar contours are within normal limits. Mild tortuosity of the thoracic aorta. The lungs are clear of acute process. No pleural effusion or pulmonary edema. Bilateral nipple shadows are noted. The bony thorax is intact. IMPRESSION: No acute cardiopulmonary findings. Electronically Signed   By: Rudie MeyerP.  Gallerani M.D.   On: 05/12/2016 15:15   Nm Myocar Multi W/spect W/wall Motion / Ef  05/13/2016   The study is normal.  This is a low risk study.  The left ventricular ejection fraction  is moderately decreased (30-44%).       Management plans discussed with the patient, family and they are in agreement.  CODE STATUS:     Code Status Orders        Start     Ordered   05/12/16 2033  Full code   Continuous     05/12/16 2032    Code Status History    Date Active Date Inactive Code Status Order ID Comments User Context   This patient has a current code status but no historical code status.      TOTAL TIME TAKING CARE OF THIS PATIENT: 40 minutes.    Houston Siren M.D on 05/13/2016 at 4:03 PM  Between 7am to 6pm - Pager - [phone]  After 6pm go to [email or web address] - password [email or web address] [facility]  [facility]  Office  [phone]  CC: Primary care physician; Leotis Shames, MD

## 2016-05-13 NOTE — Progress Notes (Signed)
Chaplain rounded the unit to provide a compassionate presence and  support to the patient. Also patient stated he will be going home this afternoon.[name]. Chaplain Shaolin Armas [phone]

## 2016-05-13 NOTE — Care Management Obs Status (Signed)
MEDICARE OBSERVATION STATUS NOTIFICATION   Patient Details  Name: Harold Sandoval MRN: [id number] Date of Birth: [birth year]   Medicare Observation Status Notification Given:  Yes    Marily MemosLisa M Kaylynn Chamblin, RN 05/13/2016, 11:10 AM

## 2016-05-13 NOTE — Progress Notes (Signed)
Patient received discharge instructions, pt verbalized understanding. IV was removed with no signs of infection. Dressing clean, dry intact. No skin tears or wounds present. Patient was escorted out with staff member via wheelchair via private auto. No further needs from care management team.  

## 2016-05-16 DIAGNOSIS — Z8673 Personal history of transient ischemic attack (TIA), and cerebral infarction without residual deficits: Secondary | ICD-10-CM | POA: Diagnosis not present

## 2016-05-16 DIAGNOSIS — I358 Other nonrheumatic aortic valve disorders: Secondary | ICD-10-CM | POA: Diagnosis not present

## 2016-05-21 DIAGNOSIS — E119 Type 2 diabetes mellitus without complications: Secondary | ICD-10-CM | POA: Diagnosis not present

## 2016-05-21 DIAGNOSIS — R0789 Other chest pain: Secondary | ICD-10-CM | POA: Diagnosis not present

## 2016-05-21 DIAGNOSIS — E7800 Pure hypercholesterolemia, unspecified: Secondary | ICD-10-CM | POA: Diagnosis not present

## 2016-05-21 DIAGNOSIS — M25522 Pain in left elbow: Secondary | ICD-10-CM | POA: Diagnosis not present

## 2016-05-29 DIAGNOSIS — M25522 Pain in left elbow: Secondary | ICD-10-CM | POA: Diagnosis not present

## 2016-05-29 DIAGNOSIS — G8929 Other chronic pain: Secondary | ICD-10-CM | POA: Diagnosis not present

## 2016-05-29 DIAGNOSIS — M7702 Medial epicondylitis, left elbow: Secondary | ICD-10-CM | POA: Diagnosis not present

## 2016-06-12 ENCOUNTER — Other Ambulatory Visit: Payer: Self-pay | Admitting: Internal Medicine

## 2016-06-12 DIAGNOSIS — R109 Unspecified abdominal pain: Secondary | ICD-10-CM

## 2016-06-26 ENCOUNTER — Ambulatory Visit
Admission: RE | Admit: 2016-06-26 | Discharge: 2016-06-26 | Disposition: A | Discharge status: Home / Self Care | Payer: PPO | Source: Ambulatory Visit | Attending: Internal Medicine | Admitting: Internal Medicine

## 2016-06-26 DIAGNOSIS — R109 Unspecified abdominal pain: Secondary | ICD-10-CM | POA: Insufficient documentation

## 2016-06-26 DIAGNOSIS — Z9889 Other specified postprocedural states: Secondary | ICD-10-CM | POA: Diagnosis not present

## 2016-06-26 DIAGNOSIS — K5730 Diverticulosis of large intestine without perforation or abscess without bleeding: Secondary | ICD-10-CM

## 2016-06-26 DIAGNOSIS — M160 Bilateral primary osteoarthritis of hip: Secondary | ICD-10-CM | POA: Insufficient documentation

## 2016-06-26 DIAGNOSIS — K5900 Constipation, unspecified: Secondary | ICD-10-CM | POA: Diagnosis not present

## 2016-06-26 HISTORY — DX: Diverticulosis of large intestine without perforation or abscess without bleeding: K57.30

## 2016-06-26 LAB — POCT I-STAT CREATININE: Creatinine, Ser: 1 mg/dL (ref 0.61–1.24)

## 2016-06-26 MED ORDER — IOPAMIDOL (ISOVUE-300) INJECTION 61%
100.0000 mL | Freq: Once | INTRAVENOUS | Status: AC | PRN
  Administered 2016-06-26: 100 mL via INTRAVENOUS

## 2016-07-10 DIAGNOSIS — H02839 Dermatochalasis of unspecified eye, unspecified eyelid: Secondary | ICD-10-CM | POA: Diagnosis not present

## 2016-07-10 DIAGNOSIS — H1859 Other hereditary corneal dystrophies: Secondary | ICD-10-CM | POA: Diagnosis not present

## 2016-07-10 DIAGNOSIS — H35059 Retinal neovascularization, unspecified, unspecified eye: Secondary | ICD-10-CM | POA: Diagnosis not present

## 2016-07-10 DIAGNOSIS — H401133 Primary open-angle glaucoma, bilateral, severe stage: Secondary | ICD-10-CM | POA: Diagnosis not present

## 2016-07-17 DIAGNOSIS — E7800 Pure hypercholesterolemia, unspecified: Secondary | ICD-10-CM | POA: Diagnosis not present

## 2016-07-17 DIAGNOSIS — E119 Type 2 diabetes mellitus without complications: Secondary | ICD-10-CM | POA: Diagnosis not present

## 2016-07-17 DIAGNOSIS — Z125 Encounter for screening for malignant neoplasm of prostate: Secondary | ICD-10-CM | POA: Diagnosis not present

## 2016-07-17 DIAGNOSIS — N529 Male erectile dysfunction, unspecified: Secondary | ICD-10-CM | POA: Diagnosis not present

## 2016-07-24 DIAGNOSIS — Z0000 Encounter for general adult medical examination without abnormal findings: Secondary | ICD-10-CM | POA: Diagnosis not present

## 2016-07-24 DIAGNOSIS — E119 Type 2 diabetes mellitus without complications: Secondary | ICD-10-CM | POA: Diagnosis not present

## 2016-08-05 DIAGNOSIS — H353222 Exudative age-related macular degeneration, left eye, with inactive choroidal neovascularization: Secondary | ICD-10-CM | POA: Diagnosis not present

## 2016-08-05 DIAGNOSIS — H353111 Nonexudative age-related macular degeneration, right eye, early dry stage: Secondary | ICD-10-CM | POA: Diagnosis not present

## 2016-08-05 DIAGNOSIS — H43811 Vitreous degeneration, right eye: Secondary | ICD-10-CM | POA: Diagnosis not present

## 2016-09-22 DIAGNOSIS — M7582 Other shoulder lesions, left shoulder: Secondary | ICD-10-CM | POA: Diagnosis not present

## 2016-09-22 DIAGNOSIS — L989 Disorder of the skin and subcutaneous tissue, unspecified: Secondary | ICD-10-CM | POA: Diagnosis not present

## 2016-09-25 DIAGNOSIS — M25512 Pain in left shoulder: Secondary | ICD-10-CM | POA: Diagnosis not present

## 2016-09-25 DIAGNOSIS — M19012 Primary osteoarthritis, left shoulder: Secondary | ICD-10-CM | POA: Diagnosis not present

## 2016-09-25 DIAGNOSIS — M75102 Unspecified rotator cuff tear or rupture of left shoulder, not specified as traumatic: Secondary | ICD-10-CM | POA: Diagnosis not present

## 2016-09-26 DIAGNOSIS — Z1211 Encounter for screening for malignant neoplasm of colon: Secondary | ICD-10-CM | POA: Diagnosis not present

## 2016-09-26 DIAGNOSIS — K5900 Constipation, unspecified: Secondary | ICD-10-CM | POA: Diagnosis not present

## 2016-11-28 ENCOUNTER — Telehealth: Payer: Self-pay | Admitting: Gastroenterology

## 2016-11-28 NOTE — Telephone Encounter (Signed)
Patient is returning a call to schedule a colonoscopy. [facility] [phone]

## 2016-12-05 ENCOUNTER — Other Ambulatory Visit: Payer: Self-pay

## 2016-12-05 ENCOUNTER — Telehealth: Payer: Self-pay

## 2016-12-05 NOTE — Telephone Encounter (Signed)
Gastroenterology Pre-Procedure Review  Request Date:  Requesting Physician: Dr.   PATIENT REVIEW QUESTIONS: The patient responded to the following health history questions as indicated:    1. Are you having any GI issues? no 2. Do you have a personal history of Polyps? no 3. Do you have a family history of Colon Cancer or Polyps? no 4. Diabetes Mellitus? yes (Type 2) 5. Joint replacements in the past 12 months?no 6. Major health problems in the past 3 months?no 7. Any artificial heart valves, MVP, or defibrillator?no    MEDICATIONS & ALLERGIES:    Patient reports the following regarding taking any anticoagulation/antiplatelet therapy:   Plavix, Coumadin, Eliquis, Xarelto, Lovenox, Pradaxa, Brilinta, or Effient? yes (Eliquis 5mg ) Aspirin? no  Patient confirms/reports the following medications:  Current Outpatient Prescriptions  Medication Sig Dispense Refill  . acetaminophen (TYLENOL) 500 MG tablet Take 1,500 mg by mouth every 8 (eight) hours as needed for mild pain or headache.    Marland Kitchen. apixaban (ELIQUIS) 5 MG TABS tablet Take 5 mg by mouth 2 (two) times daily.    Marland Kitchen. atorvastatin (LIPITOR) 80 MG tablet Take 80 mg by mouth at bedtime.    . brimonidine (ALPHAGAN) 0.2 % ophthalmic solution Place 1 drop into both eyes 2 (two) times daily.   0  . brinzolamide (AZOPT) 1 % ophthalmic suspension Place 1 drop into both eyes 2 (two) times daily.    Marland Kitchen. desonide (DESOWEN) 0.05 % lotion Apply 1 application topically 2 (two) times daily.    . fluticasone (FLONASE) 50 MCG/ACT nasal spray Place 2 sprays into both nostrils daily as needed for rhinitis.    Marland Kitchen. latanoprost (XALATAN) 0.005 % ophthalmic solution Place 1 drop into both eyes at bedtime.   0  . metFORMIN (GLUCOPHAGE-XR) 500 MG 24 hr tablet Take 500 mg by mouth daily with supper.     No current facility-administered medications for this visit.     Patient confirms/reports the following allergies:  No Known Allergies  No orders of the defined  types were placed in this encounter.   AUTHORIZATION INFORMATION Primary Insurance: 1D#: Group #:  Secondary Insurance: 1D#: Group #:  SCHEDULE INFORMATION: Date: 12/25/16 Time: Location: [facility]

## 2016-12-17 ENCOUNTER — Encounter: Payer: Self-pay | Admitting: *Deleted

## 2016-12-17 ENCOUNTER — Telehealth: Payer: Self-pay | Admitting: Gastroenterology

## 2016-12-17 ENCOUNTER — Other Ambulatory Visit: Payer: Self-pay

## 2016-12-17 MED ORDER — PEG 3350-KCL-NABCB-NACL-NASULF 236 G PO SOLR: ORAL | 0 refills | Status: DC

## 2016-12-17 NOTE — Telephone Encounter (Signed)
[facility]/16. Please check precert.

## 2016-12-17 NOTE — Telephone Encounter (Signed)
[facility] [address] [place]

## 2016-12-23 NOTE — Discharge Instructions (Signed)

## 2016-12-29 ENCOUNTER — Ambulatory Visit: Payer: PPO | Admitting: Anesthesiology

## 2016-12-29 ENCOUNTER — Encounter
Admission: RE | Disposition: A | Discharge status: Home / Self Care | Payer: Self-pay | Source: Ambulatory Visit | Attending: Gastroenterology

## 2016-12-29 ENCOUNTER — Ambulatory Visit
Admission: RE | Admit: 2016-12-29 | Discharge: 2016-12-29 | Disposition: A | Discharge status: Home / Self Care | Payer: PPO | Source: Ambulatory Visit | Attending: Gastroenterology | Admitting: Gastroenterology

## 2016-12-29 DIAGNOSIS — R194 Change in bowel habit: Secondary | ICD-10-CM | POA: Diagnosis not present

## 2016-12-29 DIAGNOSIS — Z7984 Long term (current) use of oral hypoglycemic drugs: Secondary | ICD-10-CM | POA: Diagnosis not present

## 2016-12-29 DIAGNOSIS — K5730 Diverticulosis of large intestine without perforation or abscess without bleeding: Secondary | ICD-10-CM | POA: Diagnosis not present

## 2016-12-29 DIAGNOSIS — K5900 Constipation, unspecified: Secondary | ICD-10-CM | POA: Diagnosis present

## 2016-12-29 DIAGNOSIS — K641 Second degree hemorrhoids: Secondary | ICD-10-CM | POA: Insufficient documentation

## 2016-12-29 DIAGNOSIS — Z7901 Long term (current) use of anticoagulants: Secondary | ICD-10-CM | POA: Insufficient documentation

## 2016-12-29 DIAGNOSIS — Z8673 Personal history of transient ischemic attack (TIA), and cerebral infarction without residual deficits: Secondary | ICD-10-CM | POA: Diagnosis not present

## 2016-12-29 DIAGNOSIS — E119 Type 2 diabetes mellitus without complications: Secondary | ICD-10-CM | POA: Insufficient documentation

## 2016-12-29 DIAGNOSIS — Z79899 Other long term (current) drug therapy: Secondary | ICD-10-CM | POA: Insufficient documentation

## 2016-12-29 HISTORY — DX: Other abnormalities of gait and mobility: R26.89

## 2016-12-29 HISTORY — PX: COLONOSCOPY WITH PROPOFOL: SHX5780

## 2016-12-29 LAB — GLUCOSE, CAPILLARY
Glucose-Capillary: 99 mg/dL (ref 65–99)
Glucose-Capillary: 111 mg/dL — ABNORMAL HIGH (ref 65–99)

## 2016-12-29 SURGERY — COLONOSCOPY WITH PROPOFOL
Anesthesia: Monitor Anesthesia Care | Wound class: Contaminated

## 2016-12-29 MED ORDER — LIDOCAINE HCL (CARDIAC) 20 MG/ML IV SOLN
INTRAVENOUS | Status: DC | PRN
  Administered 2016-12-29: 40 mg via INTRAVENOUS

## 2016-12-29 MED ORDER — SIMETHICONE 40 MG/0.6ML PO SUSP
ORAL | Status: DC | PRN
  Administered 2016-12-29: 08:00:00

## 2016-12-29 MED ORDER — LACTATED RINGERS IV SOLN
INTRAVENOUS | Status: DC
  Administered 2016-12-29: 07:00:00 via INTRAVENOUS

## 2016-12-29 MED ORDER — PROPOFOL 10 MG/ML IV BOLUS
INTRAVENOUS | Status: DC | PRN
  Administered 2016-12-29: 50 mg via INTRAVENOUS
  Administered 2016-12-29: 80 mg via INTRAVENOUS
  Administered 2016-12-29: 20 mg via INTRAVENOUS
  Administered 2016-12-29 (×2): 50 mg via INTRAVENOUS

## 2016-12-29 SURGICAL SUPPLY — 21 items
CANISTER SUCT 1200ML W/VALVE (MISCELLANEOUS) ×2
CLIP HMST 235XBRD CATH ROT (MISCELLANEOUS)
CLIP RESOLUTION 360 11X235 (MISCELLANEOUS)
FCP ESCP3.2XJMB 240X2.8X (MISCELLANEOUS)
FORCEPS BIOP RAD 4 LRG CAP 4 (CUTTING FORCEPS)
FORCEPS BIOP RJ4 240 W/NDL (MISCELLANEOUS)
GOWN CVR UNV OPN BCK APRN NK (MISCELLANEOUS) ×2
GOWN ISOL THUMB LOOP REG UNIV (MISCELLANEOUS) ×2
KIT DEFENDO VALVE AND CONN (KITS)
KIT ENDO PROCEDURE OLY (KITS) ×2
MARKER SPOT ENDO TATTOO 5ML (MISCELLANEOUS)
PAD GROUND ADULT SPLIT (MISCELLANEOUS)
PROBE APC STR FIRE (PROBE)
RETRIEVER NET ROTH 2.5X230 LF (MISCELLANEOUS)
SNARE SHORT THROW 13M SML OVAL (MISCELLANEOUS)
SNARE SHORT THROW 30M LRG OVAL (MISCELLANEOUS)
SNARE SNG USE RND 15MM (INSTRUMENTS)
SPOT EX ENDOSCOPIC TATTOO (MISCELLANEOUS)
TRAP ETRAP POLY (MISCELLANEOUS)
VARIJECT INJECTOR VIN23 (MISCELLANEOUS)
WATER STERILE IRR 250ML POUR (IV SOLUTION) ×2

## 2016-12-29 NOTE — H&P (Signed)
Midge Miniumarren Aris Moman, MD [facility] [address] [place], [place] [address] Phone: [phone] Fax : [phone]  Primary Care Physician:  Leotis ShamesSingh,Jasmine, MD Primary Gastroenterologist:  Dr. Servando SnareWohl  Pre-Procedure History & Physical: HPI:  Harold Sandoval is a 76 y.o. male is here for an colonoscopy.   Past Medical History:  Diagnosis Date  . Balance problem    when first stands.  Since stroke.  . Diabetes mellitus without complication (HCC)   . Stroke [facility](HCC) 11/24/2015    Past Surgical History:  Procedure Laterality Date  . APPENDECTOMY    . BACK SURGERY    . GALLBLADDER SURGERY      Prior to Admission medications   Medication Sig Start Date End Date Taking? Authorizing Provider  acetaminophen (TYLENOL) 500 MG tablet Take 1,500 mg by mouth every 8 (eight) hours as needed for mild pain or headache.   Yes Historical Provider, MD  apixaban (ELIQUIS) 5 MG TABS tablet Take 5 mg by mouth 2 (two) times daily.   Yes Historical Provider, MD  brimonidine (ALPHAGAN) 0.2 % ophthalmic solution Place 1 drop into both eyes 2 (two) times daily.    Yes Historical Provider, MD  brinzolamide (AZOPT) 1 % ophthalmic suspension Place 1 drop into both eyes 2 (two) times daily.   Yes Historical Provider, MD  desonide (DESOWEN) 0.05 % lotion Apply 1 application topically 2 (two) times daily.   Yes Historical Provider, MD  fluticasone (FLONASE) 50 MCG/ACT nasal spray Place 2 sprays into both nostrils daily as needed for rhinitis.   Yes Historical Provider, MD  latanoprost (XALATAN) 0.005 % ophthalmic solution Place 1 drop into both eyes at bedtime.    Yes Historical Provider, MD  metFORMIN (GLUCOPHAGE-XR) 500 MG 24 hr tablet Take 500 mg by mouth daily with supper.   Yes Historical Provider, MD  atorvastatin (LIPITOR) 80 MG tablet Take 80 mg by mouth at bedtime.    Historical Provider, MD  polyethylene glycol (GOLYTELY) 236 g solution Drink one 8 oz glass every 20 mins until stools are clear. 12/17/16   Midge Miniumarren  Claudia Alvizo, MD    Allergies as of 12/05/2016  . (No Known Allergies)    Family History  Problem Relation Age of Onset  . Cancer Mother   . Cancer Father     Social History   Social History  . Marital status: Single    Spouse name: N/A  . Number of children: N/A  . Years of education: N/A   Occupational History  . Not on file.   Social History Main Topics  . Smoking status: Never Smoker  . Smokeless tobacco: Never Used  . Alcohol use No  . Drug use: No  . Sexual activity: Yes    Partners: Female   Other Topics Concern  . Not on file   Social History Narrative  . No narrative on file    Review of Systems: See HPI, otherwise negative ROS  Physical Exam: BP (!) 144/89   Pulse 85   Temp 97.5 F (36.4 C) (Temporal)   Ht 6\' 4"  (1.93 m)   Wt 210 lb (95.3 kg)   SpO2 100%   BMI 25.56 kg/m  General:   Alert,  pleasant and cooperative in NAD Head:  Normocephalic and atraumatic. Neck:  Supple; no masses or thyromegaly. Lungs:  Clear throughout to auscultation.    Heart:  Regular rate and rhythm. Abdomen:  Soft, nontender and nondistended. Normal bowel sounds, without guarding, and without rebound.   Neurologic:  Alert  and  oriented x4;  grossly normal neurologically.  Impression/Plan: Harold Sandoval is here for an colonoscopy to be performed for constipation  Risks, benefits, limitations, and alternatives regarding  colonoscopy have been reviewed with the patient.  Questions have been answered.  All parties agreeable.   Midge Minium, MD  12/29/2016, 7:58 AM

## 2016-12-29 NOTE — Anesthesia Postprocedure Evaluation (Signed)
Anesthesia Post Note  Patient: Harold Sandoval  Procedure(s) Performed: Procedure(s) (LRB): COLONOSCOPY WITH PROPOFOL (N/A)  Patient location during evaluation: PACU Anesthesia Type: MAC Level of consciousness: awake and alert Pain management: pain level controlled Vital Signs Assessment: post-procedure vital signs reviewed and stable Respiratory status: spontaneous breathing, nonlabored ventilation, respiratory function stable and patient connected to nasal cannula oxygen Cardiovascular status: stable and blood pressure returned to baseline Anesthetic complications: no    Alisa Graff

## 2016-12-29 NOTE — Transfer of Care (Signed)
Immediate Anesthesia Transfer of Care Note  Patient: Harold Sandoval  Procedure(s) Performed: Procedure(s) with comments: COLONOSCOPY WITH PROPOFOL (N/A) - Diabetic - oral meds  Patient Location: PACU  Anesthesia Type: MAC  Level of Consciousness: awake, alert  and patient cooperative  Airway and Oxygen Therapy: Patient Spontanous Breathing and Patient connected to supplemental oxygen  Post-op Assessment: Post-op Vital signs reviewed, Patient's Cardiovascular Status Stable, Respiratory Function Stable, Patent Airway and No signs of Nausea or vomiting  Post-op Vital Signs: Reviewed and stable  Complications: No apparent anesthesia complications

## 2016-12-29 NOTE — Anesthesia Procedure Notes (Signed)
Procedure Name: MAC Date/Time: 12/29/2016 8:01 AM Performed by: Janna Arch Pre-anesthesia Checklist: Patient identified, Emergency Drugs available, Suction available and Patient being monitored Patient Re-evaluated:Patient Re-evaluated prior to inductionOxygen Delivery Method: Nasal cannula

## 2016-12-29 NOTE — Op Note (Addendum)
[facility] Gastroenterology Patient Name: Harold Sandoval Procedure Date: 12/29/2016 7:26 AM MRN: [id number] Account #: [id number] Date of Birth: 04/10/1941 Admit Type: Outpatient Age: 76 [facility] Gender: Male Note Status: Finalized Procedure:            Colonoscopy Indications:          Change in bowel habits, Constipation Providers:            Midge Miniumarren Darlina Mccaughey MD, MD Referring MD:         Leotis ShamesJasmine Singh (Referring MD) Medicines:            Propofol per Anesthesia Complications:        No immediate complications. Procedure:            Pre-Anesthesia Assessment:                       - Prior to the procedure, a History and Physical was                        performed, and patient medications and allergies were                        reviewed. The patient's tolerance of previous                        anesthesia was also reviewed. The risks and benefits of                        the procedure and the sedation options and risks were                        discussed with the patient. All questions were                        answered, and informed consent was obtained. Prior                        Anticoagulants: The patient has taken no previous                        anticoagulant or antiplatelet agents. ASA Grade                        Assessment: II - A patient with mild systemic disease.                        After reviewing the risks and benefits, the patient was                        deemed in satisfactory condition to undergo the                        procedure.                       After obtaining informed consent, the colonoscope was                        passed under direct vision. Throughout the procedure,  the patient's blood pressure, pulse, and oxygen                        saturations were monitored continuously. The was                        introduced through the anus and advanced to the the   cecum, identified by appendiceal orifice and ileocecal                        valve. The colonoscopy was performed without                        difficulty. The patient tolerated the procedure well.                        The quality of the bowel preparation was excellent. Findings:      The perianal and digital rectal examinations were normal.      Multiple small-mouthed diverticula were found in the sigmoid colon.      Non-bleeding internal hemorrhoids were found during retroflexion. The       hemorrhoids were Grade II (internal hemorrhoids that prolapse but reduce       spontaneously). Impression:           - Diverticulosis in the sigmoid colon.                       - Non-bleeding internal hemorrhoids.                       - No specimens collected. Recommendation:       - Discharge patient to home.                       - Resume previous diet.                       - Continue present medications.                       - Repeat colonoscopy in 10 years for screening unless                        any change in family history or lower GI problems. Procedure Code(s):    --- Professional ---                       [phone], Colonoscopy, flexible; diagnostic, including                        collection of specimen(s) by brushing or washing, when                        performed (separate procedure) Diagnosis Code(s):    --- Professional ---                       K59.00, Constipation, unspecified                       R19.4, Change in bowel habit CPT copyright 2016 American Medical Association. All rights reserved. The codes documented in this report are preliminary and upon coder review  may  be revised to meet current compliance requirements. Midge Minium MD, MD 12/29/2016 8:19:00 AM This report has been signed electronically. Number of Addenda: 0 Note Initiated On: 12/29/2016 7:26 AM Scope Withdrawal Time: 0 hours 6 minutes 21 seconds  Total Procedure Duration: 0 hours 11 minutes 3 seconds        [facility]

## 2016-12-29 NOTE — Anesthesia Preprocedure Evaluation (Signed)
Anesthesia Evaluation  Patient identified by MRN, date of birth, ID band Patient awake    Reviewed: Allergy & Precautions, H&P , NPO status , Patient's Chart, lab work & pertinent test results, reviewed documented beta blocker date and time   Airway Mallampati: II  TM Distance: >3 FB Neck ROM: full    Dental no notable dental hx.    Pulmonary neg pulmonary ROS,    Pulmonary exam normal breath sounds clear to auscultation       Cardiovascular Exercise Tolerance: Good negative cardio ROS   Rhythm:regular Rate:Normal  LVEF 35%, no RWMA, no ischemia (stress test 05/2016)   Neuro/Psych negative neurological ROS  negative psych ROS   GI/Hepatic negative GI ROS, Neg liver ROS,   Endo/Other  diabetes, Type 2  Renal/GU negative Renal ROS  negative genitourinary   Musculoskeletal   Abdominal   Peds  Hematology negative hematology ROS (+)   Anesthesia Other Findings   Reproductive/Obstetrics negative OB ROS                             Anesthesia Physical Anesthesia Plan  ASA: II  Anesthesia Plan: MAC   Post-op Pain Management:    Induction:   Airway Management Planned:   Additional Equipment:   Intra-op Plan:   Post-operative Plan:   Informed Consent: I have reviewed the patients History and Physical, chart, labs and discussed the procedure including the risks, benefits and alternatives for the proposed anesthesia with the patient or authorized representative who has indicated his/her understanding and acceptance.   Dental Advisory Given  Plan Discussed with: CRNA  Anesthesia Plan Comments:         Anesthesia Quick Evaluation

## 2016-12-30 ENCOUNTER — Encounter: Payer: Self-pay | Admitting: Gastroenterology

## 2017-01-16 ENCOUNTER — Ambulatory Visit: Admit: 2017-01-16 | Payer: PPO | Admitting: Gastroenterology

## 2017-01-16 DIAGNOSIS — E119 Type 2 diabetes mellitus without complications: Secondary | ICD-10-CM | POA: Diagnosis not present

## 2017-01-16 SURGERY — COLONOSCOPY WITH PROPOFOL
Anesthesia: General

## 2017-01-22 DIAGNOSIS — Z0000 Encounter for general adult medical examination without abnormal findings: Secondary | ICD-10-CM | POA: Diagnosis not present

## 2017-01-22 DIAGNOSIS — E119 Type 2 diabetes mellitus without complications: Secondary | ICD-10-CM | POA: Diagnosis not present

## 2017-01-22 DIAGNOSIS — E785 Hyperlipidemia, unspecified: Secondary | ICD-10-CM | POA: Diagnosis not present

## 2017-01-22 DIAGNOSIS — Z8673 Personal history of transient ischemic attack (TIA), and cerebral infarction without residual deficits: Secondary | ICD-10-CM | POA: Diagnosis not present

## 2017-01-22 DIAGNOSIS — Z125 Encounter for screening for malignant neoplasm of prostate: Secondary | ICD-10-CM | POA: Diagnosis not present

## 2017-02-03 DIAGNOSIS — H43811 Vitreous degeneration, right eye: Secondary | ICD-10-CM | POA: Diagnosis not present

## 2017-02-03 DIAGNOSIS — H353111 Nonexudative age-related macular degeneration, right eye, early dry stage: Secondary | ICD-10-CM | POA: Diagnosis not present

## 2017-02-03 DIAGNOSIS — H353222 Exudative age-related macular degeneration, left eye, with inactive choroidal neovascularization: Secondary | ICD-10-CM | POA: Diagnosis not present

## 2017-02-18 DIAGNOSIS — R2681 Unsteadiness on feet: Secondary | ICD-10-CM | POA: Diagnosis not present

## 2017-02-18 DIAGNOSIS — Z7901 Long term (current) use of anticoagulants: Secondary | ICD-10-CM | POA: Diagnosis not present

## 2017-02-18 DIAGNOSIS — I358 Other nonrheumatic aortic valve disorders: Secondary | ICD-10-CM | POA: Diagnosis not present

## 2017-02-18 DIAGNOSIS — Z8673 Personal history of transient ischemic attack (TIA), and cerebral infarction without residual deficits: Secondary | ICD-10-CM | POA: Diagnosis not present

## 2017-02-19 DIAGNOSIS — E7800 Pure hypercholesterolemia, unspecified: Secondary | ICD-10-CM | POA: Diagnosis not present

## 2017-02-19 DIAGNOSIS — I358 Other nonrheumatic aortic valve disorders: Secondary | ICD-10-CM | POA: Diagnosis not present

## 2017-02-24 ENCOUNTER — Ambulatory Visit
Admission: RE | Admit: 2017-02-24 | Discharge: 2017-02-24 | Disposition: A | Discharge status: Home / Self Care | Payer: PPO | Source: Ambulatory Visit | Attending: Cardiology | Admitting: Cardiology

## 2017-02-24 ENCOUNTER — Encounter
Admission: RE | Disposition: A | Discharge status: Home / Self Care | Payer: Self-pay | Source: Ambulatory Visit | Attending: Cardiology

## 2017-02-24 ENCOUNTER — Encounter: Payer: Self-pay | Admitting: Cardiology

## 2017-02-24 DIAGNOSIS — I358 Other nonrheumatic aortic valve disorders: Secondary | ICD-10-CM | POA: Diagnosis not present

## 2017-02-24 DIAGNOSIS — Z7984 Long term (current) use of oral hypoglycemic drugs: Secondary | ICD-10-CM | POA: Diagnosis not present

## 2017-02-24 DIAGNOSIS — E119 Type 2 diabetes mellitus without complications: Secondary | ICD-10-CM | POA: Insufficient documentation

## 2017-02-24 DIAGNOSIS — Z7901 Long term (current) use of anticoagulants: Secondary | ICD-10-CM | POA: Diagnosis not present

## 2017-02-24 DIAGNOSIS — H409 Unspecified glaucoma: Secondary | ICD-10-CM | POA: Insufficient documentation

## 2017-02-24 DIAGNOSIS — Z8673 Personal history of transient ischemic attack (TIA), and cerebral infarction without residual deficits: Secondary | ICD-10-CM | POA: Diagnosis present

## 2017-02-24 DIAGNOSIS — E7800 Pure hypercholesterolemia, unspecified: Secondary | ICD-10-CM | POA: Insufficient documentation

## 2017-02-24 DIAGNOSIS — Z79899 Other long term (current) drug therapy: Secondary | ICD-10-CM | POA: Insufficient documentation

## 2017-02-24 DIAGNOSIS — I639 Cerebral infarction, unspecified: Secondary | ICD-10-CM | POA: Diagnosis not present

## 2017-02-24 HISTORY — PX: LOOP RECORDER INSERTION: EP1214

## 2017-02-24 SURGERY — LOOP RECORDER INSERTION
Anesthesia: LOCAL

## 2017-02-24 MED ORDER — LIDOCAINE-EPINEPHRINE (PF) 1 %-1:200000 IJ SOLN
INTRAMUSCULAR | Status: DC | PRN
  Administered 2017-02-24: 10 mL

## 2017-02-24 MED ORDER — LIDOCAINE-EPINEPHRINE (PF) 1 %-1:200000 IJ SOLN
INTRAMUSCULAR | Status: AC
  Filled 2017-02-24: qty 30

## 2017-02-24 SURGICAL SUPPLY — 2 items
LOOP REVEAL LINQSYS (Prosthesis & Implant Heart) ×2 IMPLANT
PACK LOOP INSERTION (CUSTOM PROCEDURE TRAY) ×2

## 2017-03-12 DIAGNOSIS — H35059 Retinal neovascularization, unspecified, unspecified eye: Secondary | ICD-10-CM | POA: Diagnosis not present

## 2017-03-12 DIAGNOSIS — Z8673 Personal history of transient ischemic attack (TIA), and cerebral infarction without residual deficits: Secondary | ICD-10-CM | POA: Diagnosis not present

## 2017-03-12 DIAGNOSIS — R479 Unspecified speech disturbances: Secondary | ICD-10-CM | POA: Diagnosis not present

## 2017-03-12 DIAGNOSIS — H02839 Dermatochalasis of unspecified eye, unspecified eyelid: Secondary | ICD-10-CM | POA: Diagnosis not present

## 2017-03-12 DIAGNOSIS — Z95818 Presence of other cardiac implants and grafts: Secondary | ICD-10-CM | POA: Diagnosis not present

## 2017-03-12 DIAGNOSIS — E7800 Pure hypercholesterolemia, unspecified: Secondary | ICD-10-CM | POA: Diagnosis not present

## 2017-03-12 DIAGNOSIS — H1859 Other hereditary corneal dystrophies: Secondary | ICD-10-CM | POA: Diagnosis not present

## 2017-03-12 DIAGNOSIS — I358 Other nonrheumatic aortic valve disorders: Secondary | ICD-10-CM | POA: Diagnosis not present

## 2017-03-12 DIAGNOSIS — H401133 Primary open-angle glaucoma, bilateral, severe stage: Secondary | ICD-10-CM | POA: Diagnosis not present

## 2017-06-08 DIAGNOSIS — R0789 Other chest pain: Secondary | ICD-10-CM | POA: Diagnosis not present

## 2017-06-08 DIAGNOSIS — I358 Other nonrheumatic aortic valve disorders: Secondary | ICD-10-CM | POA: Diagnosis not present

## 2017-06-08 DIAGNOSIS — E7800 Pure hypercholesterolemia, unspecified: Secondary | ICD-10-CM | POA: Diagnosis not present

## 2017-06-16 DIAGNOSIS — M1712 Unilateral primary osteoarthritis, left knee: Secondary | ICD-10-CM | POA: Diagnosis not present

## 2017-06-16 DIAGNOSIS — M25552 Pain in left hip: Secondary | ICD-10-CM | POA: Diagnosis not present

## 2017-06-16 DIAGNOSIS — M1612 Unilateral primary osteoarthritis, left hip: Secondary | ICD-10-CM | POA: Diagnosis not present

## 2017-06-16 DIAGNOSIS — M25562 Pain in left knee: Secondary | ICD-10-CM | POA: Diagnosis not present

## 2017-06-22 DIAGNOSIS — M1712 Unilateral primary osteoarthritis, left knee: Secondary | ICD-10-CM | POA: Diagnosis not present

## 2017-06-22 DIAGNOSIS — M1612 Unilateral primary osteoarthritis, left hip: Secondary | ICD-10-CM | POA: Diagnosis not present

## 2017-06-25 ENCOUNTER — Ambulatory Visit: Payer: PPO | Admitting: Internal Medicine

## 2017-07-01 DIAGNOSIS — M1712 Unilateral primary osteoarthritis, left knee: Secondary | ICD-10-CM | POA: Diagnosis not present

## 2017-07-03 DIAGNOSIS — M1712 Unilateral primary osteoarthritis, left knee: Secondary | ICD-10-CM | POA: Diagnosis not present

## 2017-07-07 DIAGNOSIS — M1712 Unilateral primary osteoarthritis, left knee: Secondary | ICD-10-CM | POA: Diagnosis not present

## 2017-07-09 DIAGNOSIS — M1712 Unilateral primary osteoarthritis, left knee: Secondary | ICD-10-CM | POA: Diagnosis not present

## 2017-07-14 ENCOUNTER — Other Ambulatory Visit: Payer: Self-pay | Admitting: Orthopedic Surgery

## 2017-07-14 DIAGNOSIS — M25552 Pain in left hip: Secondary | ICD-10-CM | POA: Diagnosis not present

## 2017-07-14 DIAGNOSIS — M1612 Unilateral primary osteoarthritis, left hip: Secondary | ICD-10-CM | POA: Diagnosis not present

## 2017-07-14 DIAGNOSIS — M1712 Unilateral primary osteoarthritis, left knee: Secondary | ICD-10-CM | POA: Diagnosis not present

## 2017-07-17 ENCOUNTER — Other Ambulatory Visit (HOSPITAL_COMMUNITY): Payer: Self-pay | Admitting: Diagnostic Radiology

## 2017-07-17 ENCOUNTER — Other Ambulatory Visit: Payer: Self-pay | Admitting: Orthopedic Surgery

## 2017-07-17 ENCOUNTER — Telehealth: Payer: Self-pay | Admitting: *Deleted

## 2017-07-17 DIAGNOSIS — M25552 Pain in left hip: Secondary | ICD-10-CM

## 2017-07-20 ENCOUNTER — Ambulatory Visit
Admission: RE | Admit: 2017-07-20 | Discharge: 2017-07-20 | Disposition: A | Discharge status: Home / Self Care | Payer: PPO | Source: Ambulatory Visit | Attending: Orthopedic Surgery | Admitting: Orthopedic Surgery

## 2017-07-20 DIAGNOSIS — M25552 Pain in left hip: Secondary | ICD-10-CM | POA: Diagnosis not present

## 2017-07-20 MED ORDER — IOPAMIDOL (ISOVUE-M 200) INJECTION 41%
10.0000 mL | Freq: Once | INTRAMUSCULAR | Status: AC
  Administered 2017-07-20: 10 mL via INTRA_ARTICULAR

## 2017-07-20 MED ORDER — METHYLPREDNISOLONE ACETATE 40 MG/ML INJ SUSP (RADIOLOG
120.0000 mg | Freq: Once | INTRAMUSCULAR | Status: AC
  Administered 2017-07-20: 120 mg via INTRA_ARTICULAR

## 2017-07-21 DIAGNOSIS — E785 Hyperlipidemia, unspecified: Secondary | ICD-10-CM | POA: Diagnosis not present

## 2017-07-21 DIAGNOSIS — E119 Type 2 diabetes mellitus without complications: Secondary | ICD-10-CM | POA: Diagnosis not present

## 2017-07-21 DIAGNOSIS — Z125 Encounter for screening for malignant neoplasm of prostate: Secondary | ICD-10-CM | POA: Diagnosis not present

## 2017-07-28 DIAGNOSIS — M545 Low back pain: Secondary | ICD-10-CM | POA: Diagnosis not present

## 2017-07-28 DIAGNOSIS — Z0000 Encounter for general adult medical examination without abnormal findings: Secondary | ICD-10-CM | POA: Diagnosis not present

## 2017-07-28 DIAGNOSIS — R634 Abnormal weight loss: Secondary | ICD-10-CM | POA: Diagnosis not present

## 2017-07-28 DIAGNOSIS — M5416 Radiculopathy, lumbar region: Secondary | ICD-10-CM | POA: Diagnosis not present

## 2017-07-28 DIAGNOSIS — R05 Cough: Secondary | ICD-10-CM | POA: Diagnosis not present

## 2017-08-13 DIAGNOSIS — Z8673 Personal history of transient ischemic attack (TIA), and cerebral infarction without residual deficits: Secondary | ICD-10-CM | POA: Diagnosis not present

## 2017-08-13 DIAGNOSIS — L298 Other pruritus: Secondary | ICD-10-CM | POA: Diagnosis not present

## 2017-08-21 DIAGNOSIS — R319 Hematuria, unspecified: Secondary | ICD-10-CM | POA: Diagnosis not present

## 2017-08-21 DIAGNOSIS — M5442 Lumbago with sciatica, left side: Secondary | ICD-10-CM | POA: Diagnosis not present

## 2017-08-21 DIAGNOSIS — R310 Gross hematuria: Secondary | ICD-10-CM | POA: Diagnosis not present

## 2017-08-21 DIAGNOSIS — M25562 Pain in left knee: Secondary | ICD-10-CM | POA: Diagnosis not present

## 2017-08-24 DIAGNOSIS — M5416 Radiculopathy, lumbar region: Secondary | ICD-10-CM | POA: Diagnosis not present

## 2017-08-26 DIAGNOSIS — I639 Cerebral infarction, unspecified: Secondary | ICD-10-CM | POA: Diagnosis not present

## 2017-08-26 DIAGNOSIS — M79605 Pain in left leg: Secondary | ICD-10-CM | POA: Diagnosis not present

## 2017-09-02 DIAGNOSIS — Z5181 Encounter for therapeutic drug level monitoring: Secondary | ICD-10-CM | POA: Diagnosis not present

## 2017-09-02 DIAGNOSIS — M5136 Other intervertebral disc degeneration, lumbar region: Secondary | ICD-10-CM | POA: Diagnosis not present

## 2017-09-02 DIAGNOSIS — G894 Chronic pain syndrome: Secondary | ICD-10-CM | POA: Diagnosis not present

## 2017-09-15 DIAGNOSIS — Z8673 Personal history of transient ischemic attack (TIA), and cerebral infarction without residual deficits: Secondary | ICD-10-CM | POA: Diagnosis not present

## 2017-09-15 DIAGNOSIS — E7800 Pure hypercholesterolemia, unspecified: Secondary | ICD-10-CM | POA: Diagnosis not present

## 2017-09-15 DIAGNOSIS — I358 Other nonrheumatic aortic valve disorders: Secondary | ICD-10-CM | POA: Diagnosis not present

## 2017-09-15 DIAGNOSIS — I639 Cerebral infarction, unspecified: Secondary | ICD-10-CM | POA: Diagnosis not present

## 2017-09-18 DIAGNOSIS — H401133 Primary open-angle glaucoma, bilateral, severe stage: Secondary | ICD-10-CM | POA: Diagnosis not present

## 2017-09-21 DIAGNOSIS — M533 Sacrococcygeal disorders, not elsewhere classified: Secondary | ICD-10-CM | POA: Diagnosis not present

## 2017-10-07 DIAGNOSIS — M533 Sacrococcygeal disorders, not elsewhere classified: Secondary | ICD-10-CM | POA: Diagnosis not present

## 2017-10-07 DIAGNOSIS — Z9889 Other specified postprocedural states: Secondary | ICD-10-CM | POA: Diagnosis not present

## 2017-10-19 DIAGNOSIS — E785 Hyperlipidemia, unspecified: Secondary | ICD-10-CM | POA: Diagnosis not present

## 2017-10-19 DIAGNOSIS — M150 Primary generalized (osteo)arthritis: Secondary | ICD-10-CM | POA: Diagnosis not present

## 2017-10-19 DIAGNOSIS — E119 Type 2 diabetes mellitus without complications: Secondary | ICD-10-CM | POA: Diagnosis not present

## 2017-11-06 DIAGNOSIS — M533 Sacrococcygeal disorders, not elsewhere classified: Secondary | ICD-10-CM | POA: Diagnosis not present

## 2017-11-23 ENCOUNTER — Other Ambulatory Visit: Payer: Self-pay | Admitting: Pharmacy Technician

## 2017-11-23 NOTE — Patient Outreach (Signed)
[facility] Care Management  11/23/2017  Loyola MastWade J Kropp 1941-07-10 [id number]  Incoming [employer] EMMI call in reference to medication adherence. HIPAA identifiers verified and verbal consent received. [name] states he takes his medications daily as prescribed and does not have any barriers that would affect his adherence.  Daryll Brodrystal Lolly Glaus, CPhT [facility] [phone]

## 2017-11-27 DIAGNOSIS — E119 Type 2 diabetes mellitus without complications: Secondary | ICD-10-CM | POA: Diagnosis not present

## 2017-11-27 DIAGNOSIS — E7800 Pure hypercholesterolemia, unspecified: Secondary | ICD-10-CM | POA: Diagnosis not present

## 2017-11-27 DIAGNOSIS — Z5181 Encounter for therapeutic drug level monitoring: Secondary | ICD-10-CM | POA: Diagnosis not present

## 2017-11-27 DIAGNOSIS — I639 Cerebral infarction, unspecified: Secondary | ICD-10-CM | POA: Diagnosis not present

## 2017-11-27 DIAGNOSIS — Z1321 Encounter for screening for nutritional disorder: Secondary | ICD-10-CM | POA: Diagnosis not present

## 2017-12-22 DIAGNOSIS — I639 Cerebral infarction, unspecified: Secondary | ICD-10-CM | POA: Diagnosis not present

## 2018-01-19 ENCOUNTER — Encounter: Payer: Self-pay | Admitting: General Surgery

## 2018-01-29 ENCOUNTER — Other Ambulatory Visit: Payer: Self-pay | Admitting: Orthopedic Surgery

## 2018-02-01 ENCOUNTER — Other Ambulatory Visit: Payer: Self-pay | Admitting: Orthopedic Surgery

## 2018-02-02 NOTE — Pre-Procedure Instructions (Signed)
ACXEL DINGEE  02/02/2018      [facility] [address] [place] - [address] [place] [address] Phone: [phone] Fax: [phone]    Your procedure is scheduled on March 6  Report to [facility] Admitting at 1045 A.M.  Call this number if you have problems the morning of surgery:  [phone]   Remember:  Do not eat food or drink liquids after midnight.  Take these medicines the morning of surgery with A SIP OF WATER EYE drops, Flonase nasal spray if needed  Stop taking aspirin, BC's, Goody's, Herbal medications, Fish Oil, Aleve, Ibuprofen, Advil, Motrin  Stop Eliquis as directed by your Dr.    Yevonne Pax to Manage Your Diabetes Before and After Surgery  Why is it important to control my blood sugar before and after surgery? . Improving blood sugar levels before and after surgery helps healing and can limit problems. . A way of improving blood sugar control is eating a healthy diet by: o  Eating less sugar and carbohydrates o  Increasing activity/exercise o  Talking with your doctor about reaching your blood sugar goals . High blood sugars (greater than 180 mg/dL) can raise your risk of infections and slow your recovery, so you will need to focus on controlling your diabetes during the weeks before surgery. . Make sure that the doctor who takes care of your diabetes knows about your planned surgery including the date and location.  How do I manage my blood sugar before surgery? . Check your blood sugar at least 4 times a day, starting 2 days before surgery, to make sure that the level is not too high or low. o Check your blood sugar the morning of your surgery when you wake up and every 2 hours until you get to the Short Stay unit. . If your blood sugar is less than 70 mg/dL, you will need to treat for low blood sugar: o Do not take insulin. o Treat a low blood sugar (less than 70 mg/dL) with  cup of clear juice (cranberry or apple), 4  glucose tablets, OR glucose gel. Recheck blood sugar in 15 minutes after treatment (to make sure it is greater than 70 mg/dL). If your blood sugar is not greater than 70 mg/dL on recheck, call [phone] o  for further instructions. . Report your blood sugar to the short stay nurse when you get to Short Stay.  . If you are admitted to the hospital after surgery: o Your blood sugar will be checked by the staff and you will probably be given insulin after surgery (instead of oral diabetes medicines) to make sure you have good blood sugar levels. o The goal for blood sugar control after surgery is 80-180 mg/dL.              WHAT DO I DO ABOUT MY DIABETES MEDICATION?   Marland Kitchen Do not take oral diabetes medicines (pills) the morning of surgery. Metformin (Glucophage)   . THE NIGHT BEFORE SURGERY, take ___________ units of ___________insulin.       . THE MORNING OF SURGERY, take _____________ units of __________insulin.  . The day of surgery, do not take other diabetes injectables, including Byetta (exenatide), Bydureon (exenatide ER), Victoza (liraglutide), or Trulicity (dulaglutide).  . If your CBG is greater than 220 mg/dL, you may take  of your sliding scale (correction) dose of insulin.  Other Instructions:  Patient Signature:  Date:   Nurse Signature:  Date:   Reviewed and Endorsed by [facility] Patient Education Committee, August 2015  Do not wear jewelry, make-up or nail polish.  Do not wear lotions, powders, or perfumes, or deodorant.  Do not shave 48 hours prior to surgery.  Men may shave face and neck.  Do not bring valuables to the hospital.  [facility] is not responsible for any belongings or valuables.  Contacts, dentures or bridgework may not be worn into surgery.  Leave your suitcase in the car.  After surgery it may be brought to your room.  For patients admitted to the hospital, discharge time will be determined by your treatment  team.  Patients discharged the day of surgery will not be allowed to drive home.    Special instructions:  [place] - Preparing for Surgery  Before surgery, you can play an important role.  Because skin is not sterile, your skin needs to be as free of germs as possible.  You can reduce the number of germs on you skin by washing with CHG (chlorahexidine gluconate) soap before surgery.  CHG is an antiseptic cleaner which kills germs and bonds with the skin to continue killing germs even after washing.  Please DO NOT use if you have an allergy to CHG or antibacterial soaps.  If your skin becomes reddened/irritated stop using the CHG and inform your nurse when you arrive at Short Stay.  Do not shave (including legs and underarms) for at least 48 hours prior to the first CHG shower.  You may shave your face.  Please follow these instructions carefully:   1.  Shower with CHG Soap the night before surgery and the   morning of Surgery.  2.  If you choose to wash your hair, wash your hair first as usual with your  normal shampoo.  3.  After you shampoo, rinse your hair and body thoroughly to remove the  Shampoo.  4.  Use CHG as you would any other liquid soap.  You can apply chg directly to the skin and wash gently with scrungie or a clean washcloth.  5.  Apply the CHG Soap to your body ONLY FROM THE NECK DOWN.    Do not use on open wounds or open sores.  Avoid contact with your eyes,  ears, mouth and genitals (private parts).  Wash genitals (private parts)   with your normal soap.  6.  Wash thoroughly, paying special attention to the area where your surgery will be performed.  7.  Thoroughly rinse your body with warm water from the neck down.  8.  DO NOT shower/wash with your normal soap after using and rinsing off  the CHG Soap.  9.  Pat yourself dry with a clean towel.            10.  Wear clean pajamas.            11.  Place clean sheets on your bed the night of your first shower and do not  sleep with pets.  Day of Surgery  Do not apply any lotions/deoderants the morning of surgery.  Please wear clean clothes to the hospital/surgery center.     Please read over the following fact sheets that you were given. Pain Booklet, Coughing and Deep Breathing, MRSA Information and Surgical Site Infection Prevention

## 2018-02-03 ENCOUNTER — Encounter (HOSPITAL_COMMUNITY): Payer: Self-pay

## 2018-02-03 ENCOUNTER — Encounter (HOSPITAL_COMMUNITY)
Admission: RE | Admit: 2018-02-03 | Discharge: 2018-02-03 | Disposition: A | Discharge status: Home / Self Care | Payer: Medicare HMO | Source: Ambulatory Visit | Attending: Orthopedic Surgery | Admitting: Orthopedic Surgery

## 2018-02-03 ENCOUNTER — Ambulatory Visit (HOSPITAL_COMMUNITY)
Admission: RE | Admit: 2018-02-03 | Discharge: 2018-02-03 | Disposition: A | Discharge status: Home / Self Care | Payer: Medicare HMO | Source: Ambulatory Visit | Attending: Orthopedic Surgery | Admitting: Orthopedic Surgery

## 2018-02-03 ENCOUNTER — Other Ambulatory Visit: Payer: Self-pay

## 2018-02-03 DIAGNOSIS — Z01812 Encounter for preprocedural laboratory examination: Secondary | ICD-10-CM | POA: Insufficient documentation

## 2018-02-03 DIAGNOSIS — Z01818 Encounter for other preprocedural examination: Secondary | ICD-10-CM

## 2018-02-03 DIAGNOSIS — Z01810 Encounter for preprocedural cardiovascular examination: Secondary | ICD-10-CM | POA: Insufficient documentation

## 2018-02-03 HISTORY — DX: Other nonrheumatic aortic valve disorders: I35.8

## 2018-02-03 HISTORY — DX: Unspecified osteoarthritis, unspecified site: M19.90

## 2018-02-03 LAB — BASIC METABOLIC PANEL WITH GFR
Anion gap: 11 (ref 5–15)
BUN: 9 mg/dL (ref 6–20)
CO2: 28 mmol/L (ref 22–32)
Calcium: 10 mg/dL (ref 8.9–10.3)
Chloride: 100 mmol/L — ABNORMAL LOW (ref 101–111)
Creatinine, Ser: 0.94 mg/dL (ref 0.61–1.24)
GFR calc Af Amer: >60 mL/min
GFR calc non Af Amer: >60 mL/min
Glucose, Bld: 152 mg/dL — ABNORMAL HIGH (ref 65–99)
Potassium: 4.1 mmol/L (ref 3.5–5.1)
Sodium: 139 mmol/L (ref 135–145)

## 2018-02-03 LAB — CBC WITH DIFFERENTIAL/PLATELET
Basophils Absolute: 0 10*3/uL (ref 0.0–0.1)
Basophils Relative: 1 %
Eosinophils Absolute: 0.1 10*3/uL (ref 0.0–0.7)
Eosinophils Relative: 1 %
HCT: 53.9 % — ABNORMAL HIGH (ref 39.0–52.0)
Hemoglobin: 17.7 g/dL — ABNORMAL HIGH (ref 13.0–17.0)
Lymphocytes Relative: 29 %
Lymphs Abs: 1.8 10*3/uL (ref 0.7–4.0)
MCH: 29.8 pg (ref 26.0–34.0)
MCHC: 32.8 g/dL (ref 30.0–36.0)
MCV: 90.7 fL (ref 78.0–100.0)
Monocytes Absolute: 0.4 10*3/uL (ref 0.1–1.0)
Monocytes Relative: 7 %
Neutro Abs: 4 10*3/uL (ref 1.7–7.7)
Neutrophils Relative %: 62 %
Platelets: 229 10*3/uL (ref 150–400)
RBC: 5.94 MIL/uL — ABNORMAL HIGH (ref 4.22–5.81)
RDW: 12.3 % (ref 11.5–15.5)
WBC: 6.3 10*3/uL (ref 4.0–10.5)

## 2018-02-03 LAB — APTT: aPTT: 32 s (ref 24–36)

## 2018-02-03 LAB — GLUCOSE, CAPILLARY: Glucose-Capillary: 150 mg/dL — ABNORMAL HIGH (ref 65–99)

## 2018-02-03 LAB — HEMOGLOBIN A1C
Hgb A1c MFr Bld: 7.7 % — ABNORMAL HIGH (ref 4.8–5.6)
Mean Plasma Glucose: 174.29 mg/dL

## 2018-02-03 LAB — URINALYSIS, ROUTINE W REFLEX MICROSCOPIC
Bacteria, UA: NONE SEEN
Bilirubin Urine: NEGATIVE
Glucose, UA: NEGATIVE mg/dL
Hgb urine dipstick: NEGATIVE
Ketones, ur: NEGATIVE mg/dL
Nitrite: NEGATIVE
Protein, ur: NEGATIVE mg/dL
Specific Gravity, Urine: 1.02 (ref 1.005–1.030)
Squamous Epithelial / HPF: NONE SEEN
pH: 5 (ref 5.0–8.0)

## 2018-02-03 LAB — ABO/RH: ABO/RH(D): B POS

## 2018-02-03 LAB — TYPE AND SCREEN
ABO/RH(D): B POS
Antibody Screen: NEGATIVE

## 2018-02-03 LAB — SURGICAL PCR SCREEN
MRSA, PCR: NEGATIVE
Staphylococcus aureus: NEGATIVE

## 2018-02-03 LAB — PROTIME-INR
INR: 1.07
Prothrombin Time: 13.8 s (ref 11.4–15.2)

## 2018-02-03 NOTE — Progress Notes (Addendum)
PCP is Dr. Enzo MontgomeryJohn David Sand[place] Cardiologist is Dr. Harold HedgeKenneth Fath- plans to see him this Friday.  Pt has a loop recorder. States he will stop Eliquis 02-08-18 Denies chest pain, cough, or fever. Echo noted in care everywhere 2016 Fasting CBG's run 130's -140's Denies ever having a card cath or stress test.

## 2018-02-03 NOTE — Progress Notes (Signed)
Dr Turner Danielsowan sent a message in epic to inform him of ua results shows trace leukocytes.

## 2018-02-04 NOTE — Progress Notes (Addendum)
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 Never smoker. BMI 24.5  Medications include: Eliquis, Lipitor, metformin. Pt to hold eliquis 3 days before surgery (I spoke with pt and significant other Katha Hammingmma White to confirm they understood this).   BP 117/88   Pulse 100   Temp 36.7 C   Resp 20   Ht 6\' 4"  (1.93 m)   Wt 202 lb 3.2 oz (91.7 kg)   SpO2 100%   BMI 24.61 kg/m   Preoperative labs reviewed.   - HbA1c 7.7, glucose 152  CXR 02/03/18: No active cardiopulmonary disease.  EKG 06/08/17: NSR  TEE with bubble study 11/27/15 (Care everywhere):  1. No LA/LAA thrombus or mass 2. Lambl excresence present on AoV with trivial AR. Recommend correlation with blood cultures to exclude endocarditis. 3. Mild PR, trivial AR, MR 4. Negative saline contrast study at rest and with valsalva.  If no changes, I anticipate pt can proceed with surgery as scheduled.   Rica Mastngela Donzel Romack, FNP-BC [facility]Anesthesiology Phone: [phone] 02/05/2018 3:22 PM

## 2018-02-05 ENCOUNTER — Encounter (HOSPITAL_COMMUNITY): Payer: Self-pay

## 2018-02-09 DIAGNOSIS — M1612 Unilateral primary osteoarthritis, left hip: Secondary | ICD-10-CM | POA: Diagnosis present

## 2018-02-09 MED ORDER — TRANEXAMIC ACID 1000 MG/10ML IV SOLN
2000.0000 mg | INTRAVENOUS | Status: AC
  Administered 2018-02-10: 2000 mg via TOPICAL
  Filled 2018-02-09: qty 20

## 2018-02-09 MED ORDER — TRANEXAMIC ACID 1000 MG/10ML IV SOLN
1000.0000 mg | INTRAVENOUS | Status: AC
  Administered 2018-02-10: 1000 mg via INTRAVENOUS
  Filled 2018-02-09: qty 1100

## 2018-02-09 MED ORDER — BUPIVACAINE LIPOSOME 1.3 % IJ SUSP
20.0000 mL | INTRAMUSCULAR | Status: AC
  Administered 2018-02-10: 20 mL
  Filled 2018-02-09: qty 20

## 2018-02-09 NOTE — H&P (Signed)
TOTAL HIP ADMISSION H&P  Patient is admitted for left total hip arthroplasty.  Subjective:  Chief Complaint: left hip pain  HPI: Harold Sandoval, 77 y.o. male, has a history of pain and functional disability in the left hip(s) due to arthritis and patient has failed non-surgical conservative treatments for greater than 12 weeks to include NSAID's and/or analgesics, corticosteriod injections, use of assistive devices and activity modification.  Onset of symptoms was gradual starting 1 years ago with gradually worsening course since that time.The patient noted no past surgery on the left hip(s).  Patient currently rates pain in the left hip at 10 out of 10 with activity. Patient has night pain, worsening of pain with activity and weight bearing, trendelenberg gait, pain that interfers with activities of daily living and pain with passive range of motion. Patient has evidence of joint space narrowing by imaging studies. This condition presents safety issues increasing the risk of falls.  There is no current active infection.  Patient Active Problem List   Diagnosis Date Noted  . Constipation   . Change in bowel habits   . Chest pain 05/12/2016  . Strain of rectus abdominis muscle 08/01/2015   Past Medical History:  Diagnosis Date  . Arthritis   . Balance problem    when first stands.  Since stroke.  . Diabetes mellitus without complication (HCC)   . 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 Stroke [facility](HCC) 11/24/2015    Past Surgical History:  Procedure Laterality Date  . APPENDECTOMY    . BACK SURGERY    . COLONOSCOPY WITH PROPOFOL N/A 12/29/2016   Procedure: COLONOSCOPY WITH PROPOFOL;  Surgeon: Midge Miniumarren Wohl, MD;  [facility];  Service: Endoscopy;  Laterality: N/A;  Diabetic - oral meds  . GALLBLADDER SURGERY    . LOOP RECORDER INSERTION N/A 02/24/2017   Procedure: Loop Recorder Insertion;  Surgeon: Marcina MillardAlexander Paraschos, MD;  Location: [facility] INVASIVE CV LAB;  Service:  Cardiovascular;  Laterality: N/A;    No current facility-administered medications for this encounter.    Current Outpatient Medications  Medication Sig Dispense Refill Last Dose  . acetaminophen (TYLENOL) 500 MG tablet Take 1,000-1,500 mg by mouth every 8 (eight) hours as needed for mild pain or headache.    02/22/2017  . apixaban (ELIQUIS) 5 MG TABS tablet Take 5 mg by mouth 2 (two) times daily.   02/22/2017  . atorvastatin (LIPITOR) 80 MG tablet Take 80 mg by mouth at bedtime.   02/23/2017 at Unknown time  . brimonidine (ALPHAGAN) 0.2 % ophthalmic solution Place 1 drop into both eyes 2 (two) times daily.   0 02/22/2017  . brinzolamide (AZOPT) 1 % ophthalmic suspension Place 1 drop into both eyes 2 (two) times daily.   02/22/2017  . Cholecalciferol (VITAMIN D3) 2000 units capsule Take 2,000 Units by mouth daily.     Marland Kitchen. desonide (DESOWEN) 0.05 % lotion Apply 1 application topically 2 (two) times daily.   02/22/2017  . fluticasone (FLONASE) 50 MCG/ACT nasal spray Place 2 sprays into both nostrils daily as needed for rhinitis.   02/22/2017  . gabapentin (NEURONTIN) 600 MG tablet Take 600 mg by mouth 2 (two) times daily.     Marland Kitchen. latanoprost (XALATAN) 0.005 % ophthalmic solution Place 1 drop into both eyes at bedtime.   0 02/22/2017  . metFORMIN (GLUCOPHAGE-XR) 500 MG 24 hr tablet Take 500 mg by mouth daily with supper.   02/22/2017 at Unknown time  . senna (SENOKOT) 8.6 MG tablet Take  2 tablets by mouth daily as needed for constipation.     . polyethylene glycol (GOLYTELY) 236 g solution Drink one 8 oz glass every 20 mins until stools are clear. (Patient not taking: Reported on 02/01/2018) 4000 mL 0 Completed Course at Unknown time   Allergies  Allergen Reactions  . Lactose Intolerance (Gi) Other (See Comments)    Stomach upset    Social History   Tobacco Use  . Smoking status: Never Smoker  . Smokeless tobacco: Never Used  Substance Use Topics  . Alcohol use: No    Alcohol/week: 0.0 oz    Family  History  Problem Relation Age of Onset  . Cancer Mother   . Cancer Father      Review of Systems  Constitutional: Negative.   HENT: Negative.   Eyes: Negative.   Respiratory: Negative.   Cardiovascular:       Hx of stroke  Gastrointestinal: Negative.   Genitourinary: Negative.   Musculoskeletal: Positive for joint pain.  Skin: Negative.   Neurological: Negative.   Endo/Heme/Allergies: Bruises/bleeds easily.  Psychiatric/Behavioral: Negative.     Objective:  Physical Exam  Constitutional: He is oriented to person, place, and time. He appears well-developed and well-nourished.  HENT:  Head: Normocephalic and atraumatic.  Eyes: Pupils are equal, round, and reactive to light.  Neck: Normal range of motion. Neck supple.  Cardiovascular: Intact distal pulses.  Respiratory: Effort normal.  Musculoskeletal: He exhibits tenderness.  Patient walks with a profound left-sided limp.  The left hip is very irritable to internal rotation.  Patient says the pain radiates down to the knee with provocative maneuvers.  The knee ranges from 0/130 without discomfort and straight leg raising is negative,   Neurological: He is alert and oriented to person, place, and time.  Skin: Skin is warm and dry.  Psychiatric: He has a normal mood and affect. His behavior is normal. Judgment and thought content normal.    Vital signs in last 24 hours:    Labs:   Estimated body mass index is 24.61 kg/m as calculated from the following:   Height as of 02/03/18: 6\' 4"  (1.93 m).   Weight as of 02/03/18: 91.7 kg (202 lb 3.2 oz).   Imaging Review Plain radiographs demonstrate  bone-on-bone arthritis to the right hip, loss of over half the cartilage to the left hip, but the left hip is one that hurts the worst.    Assessment/Plan:  End stage arthritis, left hip(s)  The patient history, physical examination, clinical judgement of the provider and imaging studies are consistent with end stage  degenerative joint disease of the left hip(s) and total hip arthroplasty is deemed medically necessary. The treatment options including medical management, injection therapy, arthroscopy and arthroplasty were discussed at length. The risks and benefits of total hip arthroplasty were presented and reviewed. The risks due to aseptic loosening, infection, stiffness, dislocation/subluxation,  thromboembolic complications and other imponderables were discussed.  The patient acknowledged the explanation, agreed to proceed with the plan and consent was signed. Patient is being admitted for inpatient treatment for surgery, pain control, PT, OT, prophylactic antibiotics, VTE prophylaxis, progressive ambulation and ADL's and discharge planning.The patient is planning to be discharged home with home health services.

## 2018-02-10 ENCOUNTER — Encounter (HOSPITAL_COMMUNITY)
Admission: RE | Disposition: A | Discharge status: Home Health | Payer: Self-pay | Source: Ambulatory Visit | Attending: Orthopedic Surgery

## 2018-02-10 ENCOUNTER — Other Ambulatory Visit: Payer: Self-pay

## 2018-02-10 ENCOUNTER — Inpatient Hospital Stay (HOSPITAL_COMMUNITY): Payer: Medicare HMO | Admitting: Emergency Medicine

## 2018-02-10 ENCOUNTER — Inpatient Hospital Stay (HOSPITAL_COMMUNITY): Payer: Medicare HMO

## 2018-02-10 ENCOUNTER — Inpatient Hospital Stay (HOSPITAL_COMMUNITY): Payer: Medicare HMO | Admitting: Certified Registered Nurse Anesthetist

## 2018-02-10 ENCOUNTER — Inpatient Hospital Stay (HOSPITAL_COMMUNITY)
Admission: RE | Admit: 2018-02-10 | Discharge: 2018-02-12 | DRG: 470 | Disposition: A | Discharge status: Home Health | Payer: Medicare HMO | Source: Ambulatory Visit | Attending: Orthopedic Surgery | Admitting: Orthopedic Surgery

## 2018-02-10 ENCOUNTER — Encounter (HOSPITAL_COMMUNITY): Payer: Self-pay

## 2018-02-10 DIAGNOSIS — H3530 Unspecified macular degeneration: Secondary | ICD-10-CM | POA: Diagnosis present

## 2018-02-10 DIAGNOSIS — M1612 Unilateral primary osteoarthritis, left hip: Secondary | ICD-10-CM | POA: Diagnosis not present

## 2018-02-10 DIAGNOSIS — E739 Lactose intolerance, unspecified: Secondary | ICD-10-CM | POA: Diagnosis not present

## 2018-02-10 DIAGNOSIS — Z7984 Long term (current) use of oral hypoglycemic drugs: Secondary | ICD-10-CM

## 2018-02-10 DIAGNOSIS — Z419 Encounter for procedure for purposes other than remedying health state, unspecified: Secondary | ICD-10-CM

## 2018-02-10 DIAGNOSIS — I10 Essential (primary) hypertension: Secondary | ICD-10-CM | POA: Diagnosis not present

## 2018-02-10 DIAGNOSIS — H409 Unspecified glaucoma: Secondary | ICD-10-CM | POA: Diagnosis not present

## 2018-02-10 DIAGNOSIS — Z79899 Other long term (current) drug therapy: Secondary | ICD-10-CM | POA: Diagnosis not present

## 2018-02-10 DIAGNOSIS — D62 Acute posthemorrhagic anemia: Secondary | ICD-10-CM | POA: Diagnosis not present

## 2018-02-10 DIAGNOSIS — Z7902 Long term (current) use of antithrombotics/antiplatelets: Secondary | ICD-10-CM | POA: Diagnosis not present

## 2018-02-10 DIAGNOSIS — E7800 Pure hypercholesterolemia, unspecified: Secondary | ICD-10-CM | POA: Diagnosis not present

## 2018-02-10 DIAGNOSIS — Z8673 Personal history of transient ischemic attack (TIA), and cerebral infarction without residual deficits: Secondary | ICD-10-CM

## 2018-02-10 DIAGNOSIS — E119 Type 2 diabetes mellitus without complications: Secondary | ICD-10-CM | POA: Diagnosis not present

## 2018-02-10 DIAGNOSIS — Z888 Allergy status to other drugs, medicaments and biological substances status: Secondary | ICD-10-CM | POA: Diagnosis not present

## 2018-02-10 DIAGNOSIS — M25752 Osteophyte, left hip: Secondary | ICD-10-CM | POA: Diagnosis present

## 2018-02-10 DIAGNOSIS — M25552 Pain in left hip: Secondary | ICD-10-CM | POA: Diagnosis present

## 2018-02-10 HISTORY — DX: Headache: R51

## 2018-02-10 HISTORY — DX: Unspecified glaucoma: H40.9

## 2018-02-10 HISTORY — DX: Pure hypercholesterolemia, unspecified: E78.00

## 2018-02-10 HISTORY — PX: TOTAL HIP ARTHROPLASTY: SHX124

## 2018-02-10 HISTORY — DX: Headache, unspecified: R51.9

## 2018-02-10 HISTORY — DX: Personal history of other diseases of the musculoskeletal system and connective tissue: Z87.39

## 2018-02-10 HISTORY — DX: Unspecified macular degeneration: H35.30

## 2018-02-10 HISTORY — DX: Type 2 diabetes mellitus without complications: E11.9

## 2018-02-10 LAB — GLUCOSE, CAPILLARY
Glucose-Capillary: 147 mg/dL — ABNORMAL HIGH (ref 65–99)
Glucose-Capillary: 112 mg/dL — ABNORMAL HIGH (ref 65–99)
Glucose-Capillary: 129 mg/dL — ABNORMAL HIGH (ref 65–99)
Glucose-Capillary: 145 mg/dL — ABNORMAL HIGH (ref 65–99)

## 2018-02-10 LAB — HEMOGLOBIN A1C
Hgb A1c MFr Bld: 7.3 % — ABNORMAL HIGH (ref 4.8–5.6)
Hgb A1c MFr Bld: 7.3 % — ABNORMAL HIGH (ref 4.8–5.6)
Mean Plasma Glucose: 162.81 mg/dL
Mean Plasma Glucose: 162.81 mg/dL

## 2018-02-10 SURGERY — TOTAL HIP ARTHROPLASTY ANTERIOR APPROACH
Anesthesia: Spinal | Site: Hip | Laterality: Left

## 2018-02-10 MED ORDER — FLEET ENEMA 7-19 GM/118ML RE ENEM: 1.0000 | ENEMA | Freq: Once | RECTAL | Status: DC | PRN

## 2018-02-10 MED ORDER — ONDANSETRON HCL 4 MG PO TABS: 4.0000 mg | ORAL_TABLET | Freq: Four times a day (QID) | ORAL | Status: DC | PRN

## 2018-02-10 MED ORDER — MEPERIDINE HCL 50 MG/ML IJ SOLN: 6.2500 mg | INTRAMUSCULAR | Status: DC | PRN

## 2018-02-10 MED ORDER — LACTATED RINGERS IV SOLN: INTRAVENOUS | Status: DC

## 2018-02-10 MED ORDER — ONDANSETRON HCL 4 MG/2ML IJ SOLN
INTRAMUSCULAR | Status: AC
  Filled 2018-02-10: qty 4

## 2018-02-10 MED ORDER — ACETAMINOPHEN 325 MG PO TABS: 325.0000 mg | ORAL_TABLET | Freq: Four times a day (QID) | ORAL | Status: DC | PRN

## 2018-02-10 MED ORDER — BUPIVACAINE-EPINEPHRINE (PF) 0.5% -1:200000 IJ SOLN
INTRAMUSCULAR | Status: DC | PRN
  Administered 2018-02-10: 50 mL

## 2018-02-10 MED ORDER — DEXAMETHASONE SODIUM PHOSPHATE 10 MG/ML IJ SOLN
10.0000 mg | Freq: Once | INTRAMUSCULAR | Status: AC
  Administered 2018-02-11: 10 mg via INTRAVENOUS
  Filled 2018-02-10: qty 1

## 2018-02-10 MED ORDER — 0.9 % SODIUM CHLORIDE (POUR BTL) OPTIME
TOPICAL | Status: DC | PRN
  Administered 2018-02-10: 1000 mL

## 2018-02-10 MED ORDER — CHLORHEXIDINE GLUCONATE 4 % EX LIQD: 60.0000 mL | Freq: Once | CUTANEOUS | Status: DC

## 2018-02-10 MED ORDER — PROPOFOL 500 MG/50ML IV EMUL
INTRAVENOUS | Status: DC | PRN
  Administered 2018-02-10: 100 ug/kg/min via INTRAVENOUS

## 2018-02-10 MED ORDER — FENTANYL CITRATE (PF) 250 MCG/5ML IJ SOLN
INTRAMUSCULAR | Status: AC
  Filled 2018-02-10: qty 5

## 2018-02-10 MED ORDER — BUPIVACAINE-EPINEPHRINE (PF) 0.5% -1:200000 IJ SOLN
INTRAMUSCULAR | Status: AC
  Filled 2018-02-10: qty 60

## 2018-02-10 MED ORDER — ALUMINUM HYDROXIDE GEL 320 MG/5ML PO SUSP
15.0000 mL | ORAL | Status: DC | PRN
  Filled 2018-02-10: qty 30

## 2018-02-10 MED ORDER — HYDROMORPHONE HCL 1 MG/ML IJ SOLN: 0.5000 mg | INTRAMUSCULAR | Status: DC | PRN

## 2018-02-10 MED ORDER — METHOCARBAMOL 1000 MG/10ML IJ SOLN
500.0000 mg | Freq: Four times a day (QID) | INTRAVENOUS | Status: DC | PRN
  Filled 2018-02-10: qty 5

## 2018-02-10 MED ORDER — ONDANSETRON HCL 4 MG/2ML IJ SOLN
INTRAMUSCULAR | Status: DC | PRN
  Administered 2018-02-10: 4 mg via INTRAVENOUS

## 2018-02-10 MED ORDER — PHENYLEPHRINE HCL 10 MG/ML IJ SOLN
INTRAVENOUS | Status: DC | PRN
  Administered 2018-02-10: 25 ug/min via INTRAVENOUS

## 2018-02-10 MED ORDER — BUPIVACAINE IN DEXTROSE 0.75-8.25 % IT SOLN
INTRATHECAL | Status: DC | PRN
  Administered 2018-02-10: 15 mg via INTRATHECAL

## 2018-02-10 MED ORDER — TIZANIDINE HCL 2 MG PO TABS: 2.0000 mg | ORAL_TABLET | Freq: Four times a day (QID) | ORAL | 0 refills | Status: DC | PRN

## 2018-02-10 MED ORDER — PROMETHAZINE HCL 25 MG/ML IJ SOLN: 6.2500 mg | INTRAMUSCULAR | Status: DC | PRN

## 2018-02-10 MED ORDER — APIXABAN 2.5 MG PO TABS
2.5000 mg | ORAL_TABLET | Freq: Two times a day (BID) | ORAL | Status: DC
  Administered 2018-02-11 – 2018-02-12 (×3): 2.5 mg via ORAL
  Filled 2018-02-10 (×3): qty 1

## 2018-02-10 MED ORDER — INSULIN ASPART 100 UNIT/ML ~~LOC~~ SOLN
0.0000 [IU] | Freq: Three times a day (TID) | SUBCUTANEOUS | Status: DC
  Administered 2018-02-11: 3 [IU] via SUBCUTANEOUS
  Administered 2018-02-11: 5 [IU] via SUBCUTANEOUS
  Administered 2018-02-11: 2 [IU] via SUBCUTANEOUS
  Administered 2018-02-12: 3 [IU] via SUBCUTANEOUS
  Administered 2018-02-12: 2 [IU] via SUBCUTANEOUS

## 2018-02-10 MED ORDER — KCL IN DEXTROSE-NACL 20-5-0.45 MEQ/L-%-% IV SOLN: INTRAVENOUS | Status: DC

## 2018-02-10 MED ORDER — DOCUSATE SODIUM 100 MG PO CAPS
100.0000 mg | ORAL_CAPSULE | Freq: Two times a day (BID) | ORAL | Status: DC
  Administered 2018-02-10 – 2018-02-12 (×4): 100 mg via ORAL
  Filled 2018-02-10 (×4): qty 1

## 2018-02-10 MED ORDER — GABAPENTIN 600 MG PO TABS
600.0000 mg | ORAL_TABLET | Freq: Two times a day (BID) | ORAL | Status: DC
  Administered 2018-02-10 – 2018-02-12 (×4): 600 mg via ORAL
  Filled 2018-02-10 (×4): qty 1

## 2018-02-10 MED ORDER — FENTANYL CITRATE (PF) 100 MCG/2ML IJ SOLN
INTRAMUSCULAR | Status: DC | PRN
  Administered 2018-02-10: 50 ug via INTRAVENOUS

## 2018-02-10 MED ORDER — APIXABAN 2.5 MG PO TABS: 2.5000 mg | ORAL_TABLET | Freq: Two times a day (BID) | ORAL | 0 refills | Status: DC

## 2018-02-10 MED ORDER — METOCLOPRAMIDE HCL 5 MG PO TABS: 5.0000 mg | ORAL_TABLET | Freq: Three times a day (TID) | ORAL | Status: DC | PRN

## 2018-02-10 MED ORDER — BRINZOLAMIDE 1 % OP SUSP
1.0000 [drp] | Freq: Two times a day (BID) | OPHTHALMIC | Status: DC
  Administered 2018-02-10 – 2018-02-12 (×4): 1 [drp] via OPHTHALMIC
  Filled 2018-02-10: qty 10

## 2018-02-10 MED ORDER — SENNA 8.6 MG PO TABS
2.0000 | ORAL_TABLET | Freq: Every day | ORAL | Status: DC | PRN
  Administered 2018-02-12: 17.2 mg via ORAL
  Filled 2018-02-10: qty 2

## 2018-02-10 MED ORDER — TRANEXAMIC ACID 1000 MG/10ML IV SOLN
1000.0000 mg | Freq: Once | INTRAVENOUS | Status: AC
  Administered 2018-02-10: 1000 mg via INTRAVENOUS
  Filled 2018-02-10: qty 10

## 2018-02-10 MED ORDER — CEFAZOLIN SODIUM-DEXTROSE 2-4 GM/100ML-% IV SOLN
2.0000 g | INTRAVENOUS | Status: AC
  Administered 2018-02-10: 2 g via INTRAVENOUS
  Filled 2018-02-10: qty 100

## 2018-02-10 MED ORDER — OXYCODONE-ACETAMINOPHEN 5-325 MG PO TABS: 1.0000 | ORAL_TABLET | ORAL | 0 refills | Status: DC | PRN

## 2018-02-10 MED ORDER — LATANOPROST 0.005 % OP SOLN
1.0000 [drp] | Freq: Every day | OPHTHALMIC | Status: DC
  Administered 2018-02-10 – 2018-02-11 (×2): 1 [drp] via OPHTHALMIC
  Filled 2018-02-10: qty 2.5

## 2018-02-10 MED ORDER — ROCURONIUM BROMIDE 10 MG/ML (PF) SYRINGE
PREFILLED_SYRINGE | INTRAVENOUS | Status: AC
  Filled 2018-02-10: qty 10

## 2018-02-10 MED ORDER — BRIMONIDINE TARTRATE 0.2 % OP SOLN
1.0000 [drp] | Freq: Two times a day (BID) | OPHTHALMIC | Status: DC
  Administered 2018-02-10 – 2018-02-12 (×4): 1 [drp] via OPHTHALMIC
  Filled 2018-02-10: qty 5

## 2018-02-10 MED ORDER — POLYETHYLENE GLYCOL 3350 17 G PO PACK
17.0000 g | PACK | Freq: Every day | ORAL | Status: DC | PRN
  Administered 2018-02-12: 17 g via ORAL
  Filled 2018-02-10: qty 1

## 2018-02-10 MED ORDER — DIPHENHYDRAMINE HCL 12.5 MG/5ML PO ELIX: 12.5000 mg | ORAL_SOLUTION | ORAL | Status: DC | PRN

## 2018-02-10 MED ORDER — METFORMIN HCL ER 500 MG PO TB24
500.0000 mg | ORAL_TABLET | Freq: Every day | ORAL | Status: DC
  Administered 2018-02-11 – 2018-02-12 (×2): 500 mg via ORAL
  Filled 2018-02-10 (×2): qty 1

## 2018-02-10 MED ORDER — FENTANYL CITRATE (PF) 100 MCG/2ML IJ SOLN: 25.0000 ug | INTRAMUSCULAR | Status: DC | PRN

## 2018-02-10 MED ORDER — MIDAZOLAM HCL 2 MG/2ML IJ SOLN: 0.5000 mg | Freq: Once | INTRAMUSCULAR | Status: DC | PRN

## 2018-02-10 MED ORDER — DEXAMETHASONE SODIUM PHOSPHATE 10 MG/ML IJ SOLN
INTRAMUSCULAR | Status: DC | PRN
  Administered 2018-02-10: 5 mg via INTRAVENOUS

## 2018-02-10 MED ORDER — METOCLOPRAMIDE HCL 5 MG/ML IJ SOLN: 5.0000 mg | Freq: Three times a day (TID) | INTRAMUSCULAR | Status: DC | PRN

## 2018-02-10 MED ORDER — PHENOL 1.4 % MT LIQD: 1.0000 | OROMUCOSAL | Status: DC | PRN

## 2018-02-10 MED ORDER — LACTATED RINGERS IV SOLN
INTRAVENOUS | Status: DC | PRN
  Administered 2018-02-10 (×2): via INTRAVENOUS

## 2018-02-10 MED ORDER — MENTHOL 3 MG MT LOZG: 1.0000 | LOZENGE | OROMUCOSAL | Status: DC | PRN

## 2018-02-10 MED ORDER — OXYCODONE HCL 5 MG PO TABS
5.0000 mg | ORAL_TABLET | ORAL | Status: DC | PRN
  Administered 2018-02-10 – 2018-02-11 (×2): 10 mg via ORAL
  Administered 2018-02-11: 5 mg via ORAL
  Administered 2018-02-11 – 2018-02-12 (×5): 10 mg via ORAL
  Filled 2018-02-10 (×9): qty 2

## 2018-02-10 MED ORDER — ONDANSETRON HCL 4 MG/2ML IJ SOLN: 4.0000 mg | Freq: Four times a day (QID) | INTRAMUSCULAR | Status: DC | PRN

## 2018-02-10 MED ORDER — ACETAMINOPHEN 500 MG PO TABS
1000.0000 mg | ORAL_TABLET | Freq: Four times a day (QID) | ORAL | Status: AC
  Administered 2018-02-10 – 2018-02-11 (×4): 1000 mg via ORAL
  Filled 2018-02-10 (×4): qty 2

## 2018-02-10 MED ORDER — FLUTICASONE PROPIONATE 50 MCG/ACT NA SUSP
2.0000 | Freq: Every day | NASAL | Status: DC | PRN
  Filled 2018-02-10: qty 16

## 2018-02-10 MED ORDER — METHOCARBAMOL 500 MG PO TABS
500.0000 mg | ORAL_TABLET | Freq: Four times a day (QID) | ORAL | Status: DC | PRN
  Administered 2018-02-10 – 2018-02-12 (×4): 500 mg via ORAL
  Filled 2018-02-10 (×4): qty 1

## 2018-02-10 MED ORDER — DEXAMETHASONE SODIUM PHOSPHATE 10 MG/ML IJ SOLN
INTRAMUSCULAR | Status: AC
  Filled 2018-02-10: qty 2

## 2018-02-10 MED ORDER — PROPOFOL 10 MG/ML IV BOLUS
INTRAVENOUS | Status: AC
  Filled 2018-02-10: qty 20

## 2018-02-10 SURGICAL SUPPLY — 45 items
BAG DECANTER FOR FLEXI CONT (MISCELLANEOUS) ×2
BLADE SAW SGTL 18X1.27X75 (BLADE) ×2
CAPT HIP TOTAL 2 ×2 IMPLANT
COVER PERINEAL POST (MISCELLANEOUS) ×2
COVER SURGICAL LIGHT HANDLE (MISCELLANEOUS) ×2
DRAPE C-ARM 42X72 X-RAY (DRAPES) ×2
DRAPE STERI IOBAN 125X83 (DRAPES) ×2
DRAPE U-SHAPE 47X51 STRL (DRAPES) ×4
DRSG AQUACEL AG ADV 3.5X10 (GAUZE/BANDAGES/DRESSINGS) ×2
DURAPREP 26ML APPLICATOR (WOUND CARE) ×2
ELECT BLADE 4.0 EZ CLEAN MEGAD (MISCELLANEOUS) ×2
ELECT REM PT RETURN 9FT ADLT (ELECTROSURGICAL) ×2
FACESHIELD WRAPAROUND (MASK) ×4 IMPLANT
GLOVE BIO SURGEON STRL SZ 6.5 (GLOVE) ×2
GLOVE BIO SURGEON STRL SZ7.5 (GLOVE) ×2
GLOVE BIO SURGEON STRL SZ8.5 (GLOVE) ×2
GLOVE BIOGEL PI IND STRL 6.5 (GLOVE) ×1
GLOVE BIOGEL PI IND STRL 8 (GLOVE) ×1
GLOVE BIOGEL PI IND STRL 9 (GLOVE) ×1
GLOVE BIOGEL PI INDICATOR 6.5 (GLOVE) ×1
GLOVE BIOGEL PI INDICATOR 8 (GLOVE) ×1
GLOVE BIOGEL PI INDICATOR 9 (GLOVE) ×1
GOWN STRL REUS W/ TWL LRG LVL3 (GOWN DISPOSABLE) ×3
GOWN STRL REUS W/ TWL XL LVL3 (GOWN DISPOSABLE) ×2
GOWN STRL REUS W/TWL LRG LVL3 (GOWN DISPOSABLE) ×3
GOWN STRL REUS W/TWL XL LVL3 (GOWN DISPOSABLE) ×2
KIT BASIN OR (CUSTOM PROCEDURE TRAY) ×2
KIT ROOM TURNOVER OR (KITS) ×2
MANIFOLD NEPTUNE II (INSTRUMENTS) ×2
NEEDLE HYPO 22GX1.5 SAFETY (NEEDLE) ×4
NS IRRIG 1000ML POUR BTL (IV SOLUTION) ×2
PACK TOTAL JOINT (CUSTOM PROCEDURE TRAY) ×2
PAD ARMBOARD 7.5X6 YLW CONV (MISCELLANEOUS) ×2
SUT ETHIBOND NAB CT1 #1 30IN (SUTURE) ×2
SUT VIC AB 0 CT1 27 (SUTURE)
SUT VIC AB 0 CT1 27XBRD ANBCTR (SUTURE)
SUT VIC AB 1 CTX 36 (SUTURE) ×2
SUT VIC AB 1 CTX36XBRD ANBCTR (SUTURE) ×2
SUT VIC AB 2-0 CT1 27 (SUTURE) ×1
SUT VIC AB 2-0 CT1 TAPERPNT 27 (SUTURE) ×1
SUT VIC AB 3-0 CT1 27 (SUTURE) ×1
SUT VIC AB 3-0 CT1 TAPERPNT 27 (SUTURE) ×1
SYR CONTROL 10ML LL (SYRINGE) ×4
TOWEL OR 17X26 10 PK STRL BLUE (TOWEL DISPOSABLE) ×2
TRAY CATH 16FR W/PLASTIC CATH (SET/KITS/TRAYS/PACK)

## 2018-02-10 NOTE — Anesthesia Postprocedure Evaluation (Signed)
Anesthesia Post Note  Patient: Harold Sandoval  Procedure(s) Performed: TOTAL HIP ARTHROPLASTY ANTERIOR APPROACH (Left Hip)     Patient location during evaluation: PACU Anesthesia Type: Spinal Level of consciousness: awake and alert, patient cooperative and oriented Pain management: pain level controlled Vital Signs Assessment: post-procedure vital signs reviewed and stable Respiratory status: spontaneous breathing, nonlabored ventilation and respiratory function stable Cardiovascular status: blood pressure returned to baseline and stable Postop Assessment: patient able to bend at knees and spinal receding Anesthetic complications: no    Last Vitals:  Vitals:   02/10/18 1645 02/10/18 1658  BP: 114/76 (!) 111/92  Pulse: 74 72  Resp: 16 11  Temp:    SpO2: 100% 99%    Last Pain:  Vitals:   02/10/18 1658  TempSrc:   PainSc: 0-No pain                 Rolen Conger,E. Cheryll Keisler

## 2018-02-10 NOTE — Progress Notes (Signed)
Orthopedic Tech Progress Note Patient Details:  Loyola MastWade J Abeyta [birth year] [id number]  Ortho Devices Type of Ortho Device: Bone foam zero knee Ortho Device/Splint Interventions: Casandra DoffingOrdered       Malon Branton Craig 02/10/2018, 3:13 PM

## 2018-02-10 NOTE — Interval H&P Note (Signed)
History and Physical Interval Note:  02/10/2018 1:03 PM  Harold Sandoval  has presented today for surgery, with the diagnosis of LEFT HIP OSTEOARTHRITIS  The various methods of treatment have been discussed with the patient and family. After consideration of risks, benefits and other options for treatment, the patient has consented to  Procedure(s): TOTAL HIP ARTHROPLASTY ANTERIOR APPROACH (Left) as a surgical intervention .  The patient's history has been reviewed, patient examined, no change in status, stable for surgery.  I have reviewed the patient's chart and labs.  Questions were answered to the patient's satisfaction.     Nestor LewandowskyFrank J Wyona Neils

## 2018-02-10 NOTE — Anesthesia Procedure Notes (Signed)
Spinal  Patient location during procedure: OR End time: 02/10/2018 1:15 PM Staffing Anesthesiologist: Jairo BenJackson, Atley Scarboro, MD Performed: anesthesiologist  Preanesthetic Checklist Completed: patient identified, site marked, surgical consent, pre-op evaluation, timeout performed, IV checked, risks and benefits discussed and monitors and equipment checked Spinal Block Patient position: sitting Prep: site prepped and draped and DuraPrep Patient monitoring: blood pressure, continuous pulse ox and heart rate Approach: midline Location: L2-3 Injection technique: single-shot Needle Needle type: Quincke  Needle gauge: 25 G Needle length: 9 cm Additional Notes Pt identified in Operating room.  Monitors applied. Working IV access confirmed. Sterile prep, drape lumbar spine.  1% lido local L 2,3.  #25ga Quincke into clear CSF L 2,3.  15mg  0.75% Bupivacaine with dextrose injected with asp CSF beginning and end of injection.  Patient asymptomatic, VSS, no heme aspirated, tolerated well.  Sandford Craze Irys Nigh, MD

## 2018-02-10 NOTE — Progress Notes (Signed)
Patient retaining urine. Bladder scan showed 921 ml. In and out cath done per MD standing orders. 900 ml of clear yellow urine drained. Patient tolerated procedure well.

## 2018-02-10 NOTE — OR Nursing (Signed)
1536 I & O cath performed under aseptic technique. 100 cc of concentrated urine obtained. Pt tolerated procedure well.

## 2018-02-10 NOTE — Discharge Instructions (Addendum)
INSTRUCTIONS AFTER JOINT REPLACEMENT  ° °o Remove items at home which could result in a fall. This includes throw rugs or furniture in walking pathways °o ICE to the affected joint every three hours while awake for 30 minutes at a time, for at least the first 3-5 days, and then as needed for pain and swelling.  Continue to use ice for pain and swelling. You may notice swelling that will progress down to the foot and ankle.  This is normal after surgery.  Elevate your leg when you are not up walking on it.   °o Continue to use the breathing machine you got in the hospital (incentive spirometer) which will help keep your temperature down.  It is common for your temperature to cycle up and down following surgery, especially at night when you are not up moving around and exerting yourself.  The breathing machine keeps your lungs expanded and your temperature down. ° ° °DIET:  As you were doing prior to hospitalization, we recommend a well-balanced diet. ° °DRESSING / WOUND CARE / SHOWERING ° °Keep the surgical dressing until follow up.  The dressing is water proof, so you can shower without any extra covering.  IF THE DRESSING FALLS OFF or the wound gets wet inside, change the dressing with sterile gauze.  Please use good hand washing techniques before changing the dressing.  Do not use any lotions or creams on the incision until instructed by your surgeon.   ° °ACTIVITY ° °o Increase activity slowly as tolerated, but follow the weight bearing instructions below.   °o No driving for 6 weeks or until further direction given by your physician.  You cannot drive while taking narcotics.  °o No lifting or carrying greater than 10 lbs. until further directed by your surgeon. °o Avoid periods of inactivity such as sitting longer than an hour when not asleep. This helps prevent blood clots.  °o You may return to work once you are authorized by your doctor.  ° ° ° °WEIGHT BEARING  ° °Weight bearing as tolerated with assist  device (walker, cane, etc) as directed, use it as long as suggested by your surgeon or therapist, typically at least 4-6 weeks. ° ° °EXERCISES ° °Results after joint replacement surgery are often greatly improved when you follow the exercise, range of motion and muscle strengthening exercises prescribed by your doctor. Safety measures are also important to protect the joint from further injury. Any time any of these exercises cause you to have increased pain or swelling, decrease what you are doing until you are comfortable again and then slowly increase them. If you have problems or questions, call your caregiver or physical therapist for advice.  ° °Rehabilitation is important following a joint replacement. After just a few days of immobilization, the muscles of the leg can become weakened and shrink (atrophy).  These exercises are designed to build up the tone and strength of the thigh and leg muscles and to improve motion. Often times heat used for twenty to thirty minutes before working out will loosen up your tissues and help with improving the range of motion but do not use heat for the first two weeks following surgery (sometimes heat can increase post-operative swelling).  ° °These exercises can be done on a training (exercise) mat, on the floor, on a table or on a bed. Use whatever works the best and is most comfortable for you.    Use music or television while you are exercising so that   the exercises are a pleasant break in your day. This will make your life better with the exercises acting as a break in your routine that you can look forward to.   Perform all exercises about fifteen times, three times per day or as directed.  You should exercise both the operative leg and the other leg as well. ° °Exercises include: °  °• Quad Sets - Tighten up the muscle on the front of the thigh (Quad) and hold for 5-10 seconds.   °• Straight Leg Raises - With your knee straight (if you were given a brace, keep it on),  lift the leg to 60 degrees, hold for 3 seconds, and slowly lower the leg.  Perform this exercise against resistance later as your leg gets stronger.  °• Leg Slides: Lying on your back, slowly slide your foot toward your buttocks, bending your knee up off the floor (only go as far as is comfortable). Then slowly slide your foot back down until your leg is flat on the floor again.  °• Angel Wings: Lying on your back spread your legs to the side as far apart as you can without causing discomfort.  °• Hamstring Strength:  Lying on your back, push your heel against the floor with your leg straight by tightening up the muscles of your buttocks.  Repeat, but this time bend your knee to a comfortable angle, and push your heel against the floor.  You may put a pillow under the heel to make it more comfortable if necessary.  ° °A rehabilitation program following joint replacement surgery can speed recovery and prevent re-injury in the future due to weakened muscles. Contact your doctor or a physical therapist for more information on knee rehabilitation.  ° ° °CONSTIPATION ° °Constipation is defined medically as fewer than three stools per week and severe constipation as less than one stool per week.  Even if you have a regular bowel pattern at home, your normal regimen is likely to be disrupted due to multiple reasons following surgery.  Combination of anesthesia, postoperative narcotics, change in appetite and fluid intake all can affect your bowels.  ° °YOU MUST use at least one of the following options; they are listed in order of increasing strength to get the job done.  They are all available over the counter, and you may need to use some, POSSIBLY even all of these options:   ° °Drink plenty of fluids (prune juice may be helpful) and high fiber foods °Colace 100 mg by mouth twice a day  °Senokot for constipation as directed and as needed Dulcolax (bisacodyl), take with full glass of water  °Miralax (polyethylene glycol)  once or twice a day as needed. ° °If you have tried all these things and are unable to have a bowel movement in the first 3-4 days after surgery call either your surgeon or your primary doctor.   ° °If you experience loose stools or diarrhea, hold the medications until you stool forms back up.  If your symptoms do not get better within 1 week or if they get worse, check with your doctor.  If you experience "the worst abdominal pain ever" or develop nausea or vomiting, please contact the office immediately for further recommendations for treatment. ° ° °ITCHING:  If you experience itching with your medications, try taking only a single pain pill, or even half a pain pill at a time.  You can also use Benadryl over the counter for itching or also to   help with sleep.  ° °TED HOSE STOCKINGS:  Use stockings on both legs until for at least 2 weeks or as directed by physician office. They may be removed at night for sleeping. ° °MEDICATIONS:  See your medication summary on the “After Visit Summary” that nursing will review with you.  You may have some home medications which will be placed on hold until you complete the course of blood thinner medication.  It is important for you to complete the blood thinner medication as prescribed. ° °PRECAUTIONS:  If you experience chest pain or shortness of breath - call 911 immediately for transfer to the hospital emergency department.  ° °If you develop a fever greater that 101 F, purulent drainage from wound, increased redness or drainage from wound, foul odor from the wound/dressing, or calf pain - CONTACT YOUR SURGEON.   °                                                °FOLLOW-UP APPOINTMENTS:  If you do not already have a post-op appointment, please call the office for an appointment to be seen by your surgeon.  Guidelines for how soon to be seen are listed in your “After Visit Summary”, but are typically between 1-4 weeks after surgery. ° °OTHER INSTRUCTIONS:  ° °Knee  Replacement:  Do not place pillow under knee, focus on keeping the knee straight while resting. CPM instructions: 0-90 degrees, 2 hours in the morning, 2 hours in the afternoon, and 2 hours in the evening. Place foam block, curve side up under heel at all times except when in CPM or when walking.  DO NOT modify, tear, cut, or change the foam block in any way. ° °MAKE SURE YOU:  °• Understand these instructions.  °• Get help right away if you are not doing well or get worse.  ° ° °Thank you for letting us be a part of your medical care team.  It is a privilege we respect greatly.  We hope these instructions will help you stay on track for a fast and full recovery!  ° °Information on my medicine - ELIQUIS® (apixaban) ° °Why was Eliquis® prescribed for you? °Eliquis® was prescribed for you to reduce the risk of forming blood clots that can cause a stroke if you have a medical condition called atrial fibrillation (a type of irregular heartbeat) OR to reduce the risk of a blood clots forming after orthopedic surgery. ° °What do You need to know about Eliquis® ? °Take your Eliquis® TWICE DAILY - one tablet in the morning and one tablet in the evening with or without food.  It would be best to take the doses about the same time each day. ° °If you have difficulty swallowing the tablet whole please discuss with your pharmacist how to take the medication safely. ° °Take Eliquis® exactly as prescribed by your doctor and DO NOT stop taking Eliquis® without talking to the doctor who prescribed the medication.  Stopping may increase your risk of developing a new clot or stroke.  Refill your prescription before you run out. ° °After discharge, you should have regular check-up appointments with your healthcare provider that is prescribing your Eliquis®.  In the future your dose may need to be changed if your kidney function or weight changes by a significant amount or as you get older. ° °  What do you do if you miss a dose? °If you  miss a dose, take it as soon as you remember on the same day and resume taking twice daily.  Do not take more than one dose of ELIQUIS at the same time. ° °Important Safety Information °A possible side effect of Eliquis® is bleeding. You should call your healthcare provider right away if you experience any of the following: °? Bleeding from an injury or your nose that does not stop. °? Unusual colored urine (red or dark brown) or unusual colored stools (red or black). °? Unusual bruising for unknown reasons. °? A serious fall or if you hit your head (even if there is no bleeding). ° °Some medicines may interact with Eliquis® and might increase your risk of bleeding or clotting while on Eliquis®. To help avoid this, consult your healthcare provider or pharmacist prior to using any new prescription or non-prescription medications, including herbals, vitamins, non-steroidal anti-inflammatory drugs (NSAIDs) and supplements. ° °This website has more information on Eliquis® (apixaban): www.Eliquis.com. ° ° ° °

## 2018-02-10 NOTE — Anesthesia Preprocedure Evaluation (Addendum)
Anesthesia Evaluation  Patient identified by MRN, date of birth, ID band Patient awake    Reviewed: Allergy & Precautions, NPO status , Patient's Chart, lab work & pertinent test results  History of Anesthesia Complications Negative for: history of anesthetic complications  Airway Mallampati: II  TM Distance: >3 FB Neck ROM: Full    Dental  (+) Poor Dentition, Missing, Caps, Dental Advisory Given   Pulmonary neg pulmonary ROS,    breath sounds clear to auscultation       Cardiovascular (-) angina Rhythm:Regular Rate:Normal  '17 stress: The study is normal, low risk study. 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   Neuro/Psych Mild memory issuesCVA (balance instability), Residual Symptoms    GI/Hepatic negative GI ROS, Neg liver ROS,   Endo/Other  diabetes (glu 112), Oral Hypoglycemic Agents  Renal/GU negative Renal ROS     Musculoskeletal  (+) Arthritis ,   Abdominal   Peds  Hematology Eliquis: last dose sat mar 3   Anesthesia Other Findings   Reproductive/Obstetrics                            Anesthesia Physical Anesthesia Plan  ASA: III  Anesthesia Plan: Spinal   Post-op Pain Management:    Induction:   PONV Risk Score and Plan: 1 and Ondansetron and Dexamethasone  Airway Management Planned: Natural Airway and Simple Face Mask  Additional Equipment:   Intra-op Plan:   Post-operative Plan:   Informed Consent: I have reviewed the patients History and Physical, chart, labs and discussed the procedure including the risks, benefits and alternatives for the proposed anesthesia with the patient or authorized representative who has indicated his/her understanding and acceptance.   Dental advisory given and Consent reviewed with POA  Plan Discussed with: CRNA and Surgeon  Anesthesia Plan Comments: (Plan routine monitors, SAB)        Anesthesia Quick  Evaluation

## 2018-02-10 NOTE — Transfer of Care (Signed)
Immediate Anesthesia Transfer of Care Note  Patient: Harold Sandoval  Procedure(s) Performed: TOTAL HIP ARTHROPLASTY ANTERIOR APPROACH (Left Hip)  Patient Location: PACU  Anesthesia Type:Spinal  Level of Consciousness: awake, alert  and oriented  Airway & Oxygen Therapy: Patient Spontanous Breathing  Post-op Assessment: Report given to RN and Post -op Vital signs reviewed and stable  Post vital signs: Reviewed and stable  Last Vitals:  Vitals:   02/10/18 0952  BP: (!) 125/94  Pulse: (!) 106  Resp: 18  Temp: (!) 36.3 C  SpO2: 100%    Last Pain:  Vitals:   02/10/18 0952  TempSrc: Oral         Complications: No apparent anesthesia complications

## 2018-02-10 NOTE — Op Note (Signed)
OPERATIVE REPORT    DATE OF PROCEDURE:  02/10/2018       PREOPERATIVE DIAGNOSIS:  LEFT HIP OSTEOARTHRITIS                                                          POSTOPERATIVE DIAGNOSIS:  LEFT HIP OSTEOARTHRITIS                                                           PROCEDURE: Anterior L total hip arthroplasty using a 56 mm DePuy Pinnacle  Cup, [place]ody E[employer]x Hole Eliminator, +4 0-degree polyethylene liner, a +5x36 mm ceramic head, a 8 hi Depuy Triloc stem   SURGEON: Nestor LewandowskyFrank J Aryiah Monterosso    ASSISTANT:   Tomi LikensEric K. [employer] PA-C  (present throughout entire procedure and necessary for timely completion of the procedure)   ANESTHESIA: Spinal BLOOD LOSS: 400 FLUID REPLACEMENT: 1600cc crystalloid Antibiotic: 2gm ancef Tranexamic Acid: 1gm IV, 2gm Topical COMPLICATIONS: none    INDICATIONS FOR PROCEDURE: A 77 y.o. year-old With  LEFT HIP OSTEOARTHRITIS   for 3 years, x-rays show bone-on-bone arthritic changes, and osteophytes. Despite conservative measures with observation, anti-inflammatory medicine, narcotics, use of a cane, has severe unremitting pain and can ambulate only a few blocks before resting. Patient desires elective L total hip arthroplasty to decrease pain and increase function. The risks, benefits, and alternatives were discussed at length including but not limited to the risks of infection, bleeding, nerve injury, stiffness, blood clots, the need for revision surgery, cardiopulmonary complications, among others, and they were willing to proceed. Questions answered     PROCEDURE IN DETAIL: The patient was identified by armband,  [facility], taken to the operating room , appropriate anesthetic monitors  were attached and  anesthesia was induced with the patienton the gurney. The HANA boots were applied to the feet and he was then transferred to the HANA table with a peroneal post and support underneath the non-operative le,  which was locked in 5 lb traction. Theoperative lower extremity was then prepped and draped in the usual sterile fashion from just above the iliac crest to the knee. And a timeout procedure was performed. We then made a 12 cm incision along the interval at the leading edge of the tensor fascia lata of starting at 2 cm lateral to and 2 cm distal to the ASIS. Small bleeders in the skin and subcutaneous tissue identified and cauterized we dissected down to the fascia and made an incision in the fascia allowing us to elevate the fascia of the tensor muscle and exploited the interval between the rectus and the tensor fascia lata. A Hohmann retractor was then placed along the superior neck of the femur and a Cobra retractor along the inferior neck of the femur we teed the capsule starting out at the superior anterior aspect of the acetabulum going distally and made the T along the neck both leaflets of the T were tagged with #2 Ethibond suture. Cobra retractors were then placed along the inferior and superior neck allowing us to perform a standard neck cut  and removed the femoral head with a power corkscrew. We then placed a right angle Hohmann retractor along the anterior aspect of the acetabulum a spiked Cobra in the cotyloid notch and posteriorly a Muelller retractor. We then sequentially reamed up to a 56 mm basket reamer obtaining good coverage in all quadrants, verified by C-arm imaging. Under C-arm control with and hammered into place a 56 mm Pinnacle cup in 45 of abduction and 15 of anteversion. The cup seated nicely and required no supplemental screws. We then placed a central hole Eliminator and a 0 polyethylene liner. The foot was then externally rotated to 110, the HANA elevator was placed around the flare of the greater trochanter and the limb was extended and abducted delivering the proximal femur up into the wound. A medium Hohmann retractor was placed over the greater trochanter and a Mueller retractor  along the posterior femoral neck completing the exposure. We then performed releases superiorly and and inferiorly of the capsule going back to the pirformis fossa superiorly and to the lesser trochanter inferiorly. We then entered the proximal femur with the box cutting offset chisel followed by, a canal sounder, the chili pepper and broaching up to a 8 Hi broach. This seated nicely and we reamed the calcar. A trial reduction was performed with a 5 mm 36 mm head.The limb lengths were excellent the hip was stable in 90 of external rotation. At this point the trial components removed and we hammered into place a # 8 Hi  Offset Tri-Lock stem with Gryption coating. A + 5x36 mm ceramic ball was then hammered into place the hip was reduced and final C-arm images obtained. The wound was thoroughly irrigated with normal saline solution. We repaired the ant capsule and the tensor fascia lot a with running 0 vicryl suture. the subcutaneous tissue was closed with 2-0 and 3-0 Vicryl suture followed by an Aquacil dressing. At this point the patient was awaken and transferred to hospital gurney without difficulty. The subcutaneous tissue with 0 and 2-0 undyed Vicryl suture and the skin with running  3-0 vicryl subcuticular suture. Aquacil dressing was applied. The patient was then unclamped, rolled supine, awaken extubated and taken to recovery room without difficulty in stable condition.   Nestor Lewandowsky 02/10/2018, 3:08 PM

## 2018-02-11 ENCOUNTER — Encounter (HOSPITAL_COMMUNITY): Payer: Self-pay | Admitting: Orthopedic Surgery

## 2018-02-11 LAB — BASIC METABOLIC PANEL WITH GFR
Anion gap: 9 (ref 5–15)
BUN: 11 mg/dL (ref 6–20)
CO2: 24 mmol/L (ref 22–32)
Calcium: 9.4 mg/dL (ref 8.9–10.3)
Chloride: 102 mmol/L (ref 101–111)
Creatinine, Ser: 1.03 mg/dL (ref 0.61–1.24)
GFR calc Af Amer: >60 mL/min
GFR calc non Af Amer: >60 mL/min
Glucose, Bld: 149 mg/dL — ABNORMAL HIGH (ref 65–99)
Potassium: 4.4 mmol/L (ref 3.5–5.1)
Sodium: 135 mmol/L (ref 135–145)

## 2018-02-11 LAB — GLUCOSE, CAPILLARY
Glucose-Capillary: 143 mg/dL — ABNORMAL HIGH (ref 65–99)
Glucose-Capillary: 167 mg/dL — ABNORMAL HIGH (ref 65–99)
Glucose-Capillary: 213 mg/dL — ABNORMAL HIGH (ref 65–99)
Glucose-Capillary: 181 mg/dL — ABNORMAL HIGH (ref 65–99)

## 2018-02-11 LAB — CBC
HCT: 40.7 % (ref 39.0–52.0)
Hemoglobin: 13.8 g/dL (ref 13.0–17.0)
MCH: 30.5 pg (ref 26.0–34.0)
MCHC: 33.9 g/dL (ref 30.0–36.0)
MCV: 89.8 fL (ref 78.0–100.0)
Platelets: 166 10*3/uL (ref 150–400)
RBC: 4.53 MIL/uL (ref 4.22–5.81)
RDW: 12.5 % (ref 11.5–15.5)
WBC: 10.8 10*3/uL — ABNORMAL HIGH (ref 4.0–10.5)

## 2018-02-11 NOTE — Progress Notes (Signed)
RN told PA about pt not being able to void last Bladder Scan showed PA said give him another hour if he does not pee then straight cath one more time if still not successful after place foley.

## 2018-02-11 NOTE — Social Work (Signed)
CSW contacted [facility], [facility] will f/u.  CSW called [facility] and they indicated that they are out of network with insurance.   CSW will f/u.  Keene BreathPatricia Danely Bayliss, LCSW Clinical Social Worker [phone]

## 2018-02-11 NOTE — [place] FL2 (Signed)
[place] MEDICAID FL2 LEVEL OF CARE SCREENING TOOL     IDENTIFICATION  Patient Name: Harold Sandoval J Lucchetti Birthdate: Jan 11, 1941 Sex: male Admission Date (Current Location): 02/10/2018  [facility] [place]   Facility and Address:  [facility], [address], [place], [place] [address]      Provider Number: [id number]  Attending Physician Name and Address:  Gean Birchwoodowan, Frank, MD  Relative Name and Phone Number:  Katha Hammingmma White, significant other, [phone]    Current Level of Care: Hospital Recommended Level of Care: Skilled Nursing Facility Prior Approval Number:    Date Approved/Denied:   PASRR Number: [id number]  Discharge Plan: SNF    Current Diagnoses: Patient Active Problem List   Diagnosis Date Noted  . 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 Osteoarthritis of left hip 02/09/2018  . Constipation   . Change in bowel habits   . Chest pain 05/12/2016  . Strain of rectus abdominis muscle 08/01/2015    Orientation RESPIRATION BLADDER Height & Weight     Self, Time, Situation, Place  Normal Continent Weight: 202 lb (91.6 kg) Height:  6\' 4"  (193 cm)  BEHAVIORAL SYMPTOMS/MOOD NEUROLOGICAL BOWEL NUTRITION STATUS      Continent Diet(See DC Summary)  AMBULATORY STATUS COMMUNICATION OF NEEDS Skin   Limited Assist Verbally Surgical wounds                       Personal Care Assistance Level of Assistance  Bathing, Feeding, Dressing Bathing Assistance: Limited assistance Feeding assistance: Independent Dressing Assistance: Limited assistance     Functional Limitations Info  Sight, Hearing, Speech Sight Info: Adequate Hearing Info: Adequate Speech Info: Adequate    SPECIAL CARE FACTORS FREQUENCY  PT (By licensed PT), OT (By licensed OT)     PT Frequency: 5x week OT Frequency: 5x week            Contractures      Additional Factors Info  Code Status, Allergies, Insulin Sliding Scale Code Status Info:  Full Allergies Info: LACTOSE INTOLERANCE (GI), LACTULOSE   Insulin Sliding Scale Info: Insulin Daily       Current Medications (02/11/2018):  This is the current hospital active medication list Current Facility-Administered Medications  Medication Dose Route Frequency Provider Last Rate Last Dose  . acetaminophen (TYLENOL) tablet 1,000 mg  1,000 mg Oral Q6H Allena Katzhillips, Eric K, PA-C   1,000 mg at 02/11/18 [id number]  . acetaminophen (TYLENOL) tablet 325-650 mg  325-650 mg Oral Q6H PRN Allena KatzPhillips, Eric K, PA-C      . aluminum hydroxide (AMPHOJEL/ALTERNAGEL) suspension 15-30 mL  15-30 mL Oral Q4H PRN Allena KatzPhillips, Eric K, PA-C      . apixaban Everlene Balls(ELIQUIS) tablet 2.5 mg  2.5 mg Oral Q12H Allena Katzhillips, Eric K, PA-C   2.5 mg at 02/11/18 0758  . brimonidine (ALPHAGAN) 0.2 % ophthalmic solution 1 drop  1 drop Both Eyes BID Allena Katzhillips, Eric K, PA-C   1 drop at 02/11/18 [id number]  . brinzolamide (AZOPT) 1 % ophthalmic suspension 1 drop  1 drop Both Eyes BID Allena Katzhillips, Eric K, PA-C   1 drop at 02/11/18 0800  . dextrose 5 % and 0.45 % NaCl with KCl 20 mEq/L infusion   Intravenous Continuous Allena KatzPhillips, Eric K, PA-C      . diphenhydrAMINE (BENADRYL) 12.5 MG/5ML elixir 12.5-25 mg  12.5-25 mg Oral Q4H PRN Dannielle BurnPhillips, Eric K, PA-C      . docusate sodium (COLACE) capsule 100 mg  100 mg Oral BID Allena Katz, PA-C   100 mg at 02/11/18 0801  . fluticasone (FLONASE) 50 MCG/ACT nasal spray 2 spray  2 spray Each Nare Daily PRN Allena Katz, PA-C      . gabapentin (NEURONTIN) tablet 600 mg  600 mg Oral BID Allena Katz, PA-C   600 mg at 02/11/18 0801  . HYDROmorphone (DILAUDID) injection 0.5-1 mg  0.5-1 mg Intravenous Q4H PRN Allena Katz, PA-C      . insulin aspart (novoLOG) injection 0-15 Units  0-15 Units Subcutaneous TID WC Allena Katz, PA-C   2 Units at 02/11/18 0759  . latanoprost (XALATAN) 0.005 % ophthalmic solution 1 drop  1 drop Both Eyes QHS Allena Katz, PA-C   1 drop at 02/10/18 2214  . menthol-cetylpyridinium  (CEPACOL) lozenge 3 mg  1 lozenge Oral PRN Allena Katz, PA-C       Or  . phenol (CHLORASEPTIC) mouth spray 1 spray  1 spray Mouth/Throat PRN Allena Katz, PA-C      . metFORMIN (GLUCOPHAGE-XR) 24 hr tablet 500 mg  500 mg Oral Q supper Allena Katz, PA-C      . methocarbamol (ROBAXIN) tablet 500 mg  500 mg Oral Q6H PRN Allena Katz, PA-C   500 mg at 02/11/18 0511   Or  . methocarbamol (ROBAXIN) 500 mg in dextrose 5 % 50 mL IVPB  500 mg Intravenous Q6H PRN Allena Katz, PA-C      . metoCLOPramide (REGLAN) tablet 5-10 mg  5-10 mg Oral Q8H PRN Allena Katz, PA-C       Or  . metoCLOPramide (REGLAN) injection 5-10 mg  5-10 mg Intravenous Q8H PRN Allena Katz, PA-C      . ondansetron [facility]) tablet 4 mg  4 mg Oral Q6H PRN Allena Katz, PA-C       Or  . ondansetron [facility]) injection 4 mg  4 mg Intravenous Q6H PRN Allena Katz, PA-C      . oxyCODONE (Oxy IR/ROXICODONE) immediate release tablet 5-10 mg  5-10 mg Oral Q4H PRN Allena Katz, PA-C   10 mg at 02/11/18 0936  . polyethylene glycol (MIRALAX / GLYCOLAX) packet 17 g  17 g Oral Daily PRN Allena Katz, PA-C      . senna (SENOKOT) tablet 17.2 mg  2 tablet Oral Daily PRN Allena Katz, PA-C      . sodium phosphate (FLEET) 7-19 GM/118ML enema 1 enema  1 enema Rectal Once PRN Allena Katz, PA-C         Discharge Medications: Please see discharge summary for a list of discharge medications.  Relevant Imaging Results:  Relevant Lab Results:   Additional Information SS#: [id number]  Tresa Moore, LCSW

## 2018-02-11 NOTE — Progress Notes (Signed)
PM Progress Note   02/11/18 1733  PT Visit Information  Last PT Received On 02/11/18  Assistance Needed +1  History of Present Illness pt is a 77 y/o male with pmh significant for macular degeration L eye, stroke, dm2, balance issues, admittee for elective L THA.  Subjective Data  Patient Stated Goal go to rehab to get independent befor home  Precautions  Precautions Anterior Hip  Precaution Comments anterior hip precaution stessed multiple times to pt and significant other  Restrictions  LLE Weight Bearing WBAT  Pain Assessment  Pain Assessment 0-10  Pain Score 7  Pain Location hip  Pain Descriptors / Indicators Sore;Guarding;Grimacing  Pain Intervention(s) Monitored during session  Cognition  Arousal/Alertness Awake/alert  Behavior During Therapy WFL for tasks assessed/performed  Overall Cognitive Status Within Functional Limits for tasks assessed  Bed Mobility  Overal bed mobility Needs Assistance  Bed Mobility Sit to Supine  Sit to supine Min assist  General bed mobility comments assisted L LE into the bed.  Transfers  Overall transfer level Needs assistance  Equipment used Rolling walker (2 wheeled)  Transfers Sit to/from Stand  Sit to Stand Min guard  General transfer comment cues for hand placement, stability assist  Ambulation/Gait  Ambulation/Gait assistance Min guard  Ambulation Distance (Feet) 130 Feet  Assistive device Rolling walker (2 wheeled)  Gait Pattern/deviations Step-to pattern;Step-through pattern;Decreased step length - right;Decreased step length - left  General Gait Details cues for safer/better use of the RW.  Gait velocity interpretation Below normal speed for age/gender  Balance  Sitting balance-Leahy Scale Fair  Standing balance-Leahy Scale Fair  Exercises  Exercises Total Joint  Total Joint Exercises  Ankle Circles/Pumps AROM;15 reps  Quad Sets AROM;Both;10 reps;Supine  Heel Slides AAROM;Left;Supine;20 reps  Gluteal Sets AROM;Both;10  reps;Supine  Short Arc Quad AROM;Strengthening;Left;10 reps;Supine  PT - End of Session  Activity Tolerance Patient tolerated treatment well  Patient left in bed;with call bell/phone within reach;with family/visitor present  Nurse Communication Mobility status;Precautions  PT - Assessment/Plan  PT Plan Current plan remains appropriate  PT Visit Diagnosis Unsteadiness on feet (R26.81);Other abnormalities of gait and mobility (R26.89);Pain  Pain - Right/Left Left  Pain - part of body Hip  PT Frequency (ACUTE ONLY) 7X/week  Recommendations for Other Services OT consult  Follow Up Recommendations Follow surgeon's recommendation for DC plan and follow-up therapies;Other (comment)  PT equipment Rolling walker with 5" wheels;3in1 (PT)  AM-PAC PT "6 Clicks" Daily Activity Outcome Measure  Difficulty turning over in bed (including adjusting bedclothes, sheets and blankets)? 1  Difficulty moving from lying on back to sitting on the side of the bed?  3  Difficulty sitting down on and standing up from a chair with arms (e.g., wheelchair, bedside commode, etc,.)? 3  Help needed moving to and from a bed to chair (including a wheelchair)? 3  Help needed walking in hospital room? 3  Help needed climbing 3-5 steps with a railing?  3  6 Click Score 16  Mobility G Code  CK  PT Goal Progression  Progress towards PT goals Progressing toward goals  Acute Rehab PT Goals  PT Goal Formulation With patient  Time For Goal Achievement 02/18/18  Potential to Achieve Goals Good  PT Time Calculation  PT Start Time (ACUTE ONLY) 1548  PT Stop Time (ACUTE ONLY) 1613  PT Time Calculation (min) (ACUTE ONLY) 25 min  PT General Charges  $$ ACUTE PT VISIT 1 Visit  PT Treatments  $Gait Training 8-22 mins  $Therapeutic  Exercise 8-22 mins   02/11/2018  [place] BingKen Mardelle Pandolfi, PT [phone]  (pager)

## 2018-02-11 NOTE — Clinical Social Work Note (Signed)
Clinical Social Work Assessment  Patient Details  Name: Harold Sandoval MRN: [id number] Date of Birth: September 04, 1941  Date of referral:  02/11/18               Reason for consult:  Facility Placement                Permission sought to share information with:  Facility Medical sales representativeContact Representative Permission granted to share information::  Yes, Verbal Permission Granted  Name::     Katha Hammingmma, White, cell, [phone]  Agency::  SNF  Relationship::  significant other  Contact Information:     Housing/Transportation Living arrangements for the past 2 months:  Single Family Home Source of Information:  Patient, Partner Patient Interpreter Needed:  None Criminal Activity/Legal Involvement Pertinent to Current Situation/Hospitalization:  No - Comment as needed Significant Relationships:  Significant Other, Other Family Members Lives with:  Significant Other Do you feel safe going back to the place where you live?  No Need for family participation in patient care:  No (Coment)  Care giving concerns:  Pt from home with significant other. Pt initially going home and now feels that his significant other will not be able to care for him at home. Possible home vs. SNF.  Therapy is recommending SNF at discharge. CSW discussed SNF options/placement. CSW explained Insurance process and authorization through [employer]. CSW explained barriers of auth and placement.  Pt and significant other voiced understanding. Patient gave authorization [place] send [place].  Social Worker assessment / plan:  CSW will f/u for SNF offers and refer to SNF selected to initiate insurance auth. CSW will f/u for disposition.  Employment status:  Retired Database administratornsurance information:  Managed Medicare PT Recommendations:  Home with [employer], Skilled Nursing Facility Information / Referral to community resources:  Skilled Nursing Facility  Patient/Family's Response to care:  Patient/significant other appreciative of CSW  explaining SNF process and disposition.  Patient/Family's Understanding of and Emotional Response to Diagnosis, Current Treatment, and Prognosis:  Patient/significant other understand patient's impairment and are unsure of home vs. SNF. Pt indicated that he has some help at home, but is unsure if it will be enough help and wants to explore options of SNF. Pt desires to get home once he is better. No issues or concerns identified.  Emotional Assessment Appearance:  Appears stated age Attitude/Demeanor/Rapport:  (Cooperative) Affect (typically observed):  Accepting, Appropriate Orientation:  Oriented to Situation, Oriented to  Time, Oriented to Place, Oriented to Self Alcohol / Substance use:  Not Applicable Psych involvement (Current and /or in the community):  No (Comment)  Discharge Needs  Concerns to be addressed:  Discharge Planning Concerns Readmission within the last 30 days:  No Current discharge risk:  Physical Impairment Barriers to Discharge:  No Barriers Identified   Tresa Mooreatricia V Seraj Dunnam, LCSW 02/11/2018, 12:40 PM

## 2018-02-11 NOTE — Social Work (Signed)
CSW discussed SNF offers with patient/family. They accepted SNF bed from [facility].  CSW contacted hospital liaison, Jomarie LongsJoseph and confirmed bed offers. SNF will start Insurance Auth for SNF placement.  Keene BreathPatricia Kelby Adell, LCSW Clinical Social Worker [phone]

## 2018-02-11 NOTE — Progress Notes (Addendum)
Bladder scan 452 ml. In and out cath done per MD order. This is the second in and out cath done since patient came back from surgery. Drained 450 ml of clear yellow urine. Patient tolerated procedure well

## 2018-02-11 NOTE — Progress Notes (Signed)
PATIENT ID: Harold Sandoval  MRN: [id number]  DOB/AGE:  1941/06/09 / 77 y.o.  1 Day Post-Op Procedure(s) (LRB): TOTAL HIP ARTHROPLASTY ANTERIOR APPROACH (Left)    PROGRESS NOTE Subjective: Patient is alert, oriented, no Nausea, no Vomiting, yes passing gas, . Taking PO well. Denies SOB, Chest or Calf Pain. Using Incentive Spirometer, PAS in place. Ambulate WBAT Patient reports pain as  4/10. Required I&O csth lst nite  Objective: Vital signs in last 24 hours: Vitals:   02/10/18 1845 02/10/18 2100 02/11/18 0044 02/11/18 0449  BP: 138/77 (!) 147/97 120/70 116/67  Pulse: 63 96 70 81  Resp:  14 14 14   Temp: 98.1 F (36.7 C) 98.3 F (36.8 C) 98 F (36.7 C) 98 F (36.7 C)  TempSrc: Oral Oral Oral Oral  SpO2: 98% 98% 95% 97%  Weight:      Height:          Intake/Output from previous day: I/O last 3 completed shifts: In: 2200 [I.V.:2200] Out: 150 [Blood:150]   Intake/Output this shift: Total I/O In: -  Out: 900 [Urine:900]   LABORATORY DATA: Recent Labs    02/10/18 1547 02/10/18 2126 02/11/18 0603  GLUCAP 129* 145* 143*    Examination: Neurologically intact ABD soft Neurovascular intact Sensation intact distally Intact pulses distally Dorsiflexion/Plantar flexion intact Incision: dressing C/D/I No cellulitis present Compartment soft} XR AP&Lat of hip shows well placed\fixed THA  Assessment:   1 Day Post-Op Procedure(s) (LRB): TOTAL HIP ARTHROPLASTY ANTERIOR APPROACH (Left) ADDITIONAL DIAGNOSIS:  Expected Acute Blood Loss Anemia, Diabetes and Hypertension  Plan: PT/OT WBAT, THA  DVT Prophylaxis: SCDx72 hrs, Eliquis 2.5 mg BID x 2 weeks  DISCHARGE PLAN: Home, if passes PT  DISCHARGE NEEDS: HHPT, Walker and 3-in-1 comode seat

## 2018-02-11 NOTE — Evaluation (Signed)
Physical Therapy Evaluation Patient Details Name: Harold Sandoval MRN: [id number] DOB: 07-10-1941 Today's Date: 02/11/2018   History of Present Illness  pt is a 77 y/o male with pmh significant for macular degeration L eye, stroke, dm2, balance issues, admittee for elective L THA.  Clinical Impression  Pt admitted with/for elective THA.  Pt presently needing min guard to min assist for all mobility.  Pt currently limited functionally due to the problems listed. ( See problems list.)   Pt will benefit from PT to maximize function and safety in order to get ready for next venue listed below.     Follow Up Recommendations Follow surgeon's recommendation for DC plan and follow-up therapies;Other (comment)(pt wishes ST SNF to not have his friend be burdened.)    Equipment Recommendations  Rolling walker with 5" wheels;3in1 (PT)    Recommendations for Other Services OT consult     Precautions / Restrictions Precautions Precautions: Anterior Hip Precaution Booklet Issued: No Precaution Comments: anterior hip precaution stessed multiple times to pt and significant other Restrictions Weight Bearing Restrictions: Yes LLE Weight Bearing: Weight bearing as tolerated      Mobility  Bed Mobility Overal bed mobility: Needs Assistance Bed Mobility: Supine to Sit     Supine to sit: Min guard     General bed mobility comments: Bridged to EOB without assist and sat up forward with struggle, but no rail, no assist  Transfers Overall transfer level: Needs assistance   Transfers: Sit to/from Stand Sit to Stand: Min assist         General transfer comment: cues for hand placement, stability assist  Ambulation/Gait Ambulation/Gait assistance: Min assist Ambulation Distance (Feet): 120 Feet Assistive device: Rolling walker (2 wheeled) Gait Pattern/deviations: Step-to pattern;Decreased step length - right;Decreased step length - left;Decreased stride length   Gait velocity  interpretation: Below normal speed for age/gender General Gait Details: generally steady step to gait with several episodes of mild instability with a wobble and step to maintain balance.  Stairs            Wheelchair Mobility    Modified Rankin (Stroke Patients Only)       Balance Overall balance assessment: Needs assistance Sitting-balance support: No upper extremity supported Sitting balance-Leahy Scale: Fair       Standing balance-Leahy Scale: Fair                               Pertinent Vitals/Pain Pain Assessment: 0-10 Pain Score: 5 (to 7) Pain Location: hip Pain Descriptors / Indicators: Sore;Guarding;Grimacing Pain Intervention(s): Monitored during session    Home Living Family/patient expects to be discharged to:: Unsure Living Arrangements: Alone                    Prior Function Level of Independence: Independent with assistive device(s)               Hand Dominance        Extremity/Trunk Assessment   Upper Extremity Assessment Upper Extremity Assessment: Defer to OT evaluation    Lower Extremity Assessment Lower Extremity Assessment: Defer to PT evaluation;Generalized weakness;LLE deficits/detail LLE Deficits / Details: weak with pain in hip. LLE: Unable to fully assess due to pain    Cervical / Trunk Assessment Cervical / Trunk Assessment: Other exceptions(pt unable to attain fully erect posture)  Communication   Communication: No difficulties  Cognition Arousal/Alertness: Awake/alert Behavior During Therapy: WFL for tasks assessed/performed Overall  Cognitive Status: Within Functional Limits for tasks assessed                                        General Comments General comments (skin integrity, edema, etc.): Initiated exercise with pt and precautions with pt and friend.    Exercises Total Joint Exercises Ankle Circles/Pumps: AROM;15 reps Quad Sets: AROM;Both;10 reps;Supine Gluteal  Sets: AROM;Both;10 reps;Supine Heel Slides: AAROM;Left;10 reps;Supine   Assessment/Plan    PT Assessment Patient needs continued PT services  PT Problem List Decreased strength;Decreased activity tolerance;Decreased balance;Decreased mobility;Decreased knowledge of use of DME;Decreased knowledge of precautions;Pain       PT Treatment Interventions DME instruction;Gait training;Functional mobility training;Therapeutic activities;Stair training;Therapeutic exercise;Balance training;Patient/family education    PT Goals (Current goals can be found in the Care Plan section)  Acute Rehab PT Goals Patient Stated Goal: go to rehab to get independent befor home PT Goal Formulation: With patient Time For Goal Achievement: 02/18/18 Potential to Achieve Goals: Good    Frequency 7X/week   Barriers to discharge        Co-evaluation               AM-PAC PT "6 Clicks" Daily Activity  Outcome Measure Difficulty turning over in bed (including adjusting bedclothes, sheets and blankets)?: Unable Difficulty moving from lying on back to sitting on the side of the bed? : A Little Difficulty sitting down on and standing up from a chair with arms (e.g., wheelchair, bedside commode, etc,.)?: Unable Help needed moving to and from a bed to chair (including a wheelchair)?: A Little Help needed walking in hospital room?: A Little Help needed climbing 3-5 steps with a railing? : A Little 6 Click Score: 14    End of Session   Activity Tolerance: Patient tolerated treatment well Patient left: in chair;with call bell/phone within reach Nurse Communication: Mobility status;Precautions PT Visit Diagnosis: Unsteadiness on feet (R26.81);Other abnormalities of gait and mobility (R26.89);Pain Pain - Right/Left: Left Pain - part of body: Hip    Time: [id number] PT Time Calculation (min) (ACUTE ONLY): 45 min   Charges:   PT Evaluation $PT Eval Moderate Complexity: 1 Mod PT Treatments $Gait Training:  8-22 mins $Therapeutic Exercise: 8-22 mins   PT G Codes:        02/11/2018  Harold Sandoval, PT [phone]  (pager)  Harold Sandoval 02/11/2018, 10:54 AM

## 2018-02-12 LAB — GLUCOSE, CAPILLARY
Glucose-Capillary: 136 mg/dL — ABNORMAL HIGH (ref 65–99)
Glucose-Capillary: 164 mg/dL — ABNORMAL HIGH (ref 65–99)

## 2018-02-12 LAB — CBC
HCT: 38.4 % — ABNORMAL LOW (ref 39.0–52.0)
Hemoglobin: 13.6 g/dL (ref 13.0–17.0)
MCH: 31.4 pg (ref 26.0–34.0)
MCHC: 35.4 g/dL (ref 30.0–36.0)
MCV: 88.7 fL (ref 78.0–100.0)
Platelets: 164 10*3/uL (ref 150–400)
RBC: 4.33 MIL/uL (ref 4.22–5.81)
RDW: 12.7 % (ref 11.5–15.5)
WBC: 10.9 10*3/uL — ABNORMAL HIGH (ref 4.0–10.5)

## 2018-02-12 NOTE — Progress Notes (Signed)
PATIENT ID: Harold Sandoval  MRN: [id number]  DOB/AGE:  Jan 25, 1941 / 77 y.o.  2 Days Post-Op Procedure(s) (LRB): TOTAL HIP ARTHROPLASTY ANTERIOR APPROACH (Left)    PROGRESS NOTE Subjective: Patient is alert, oriented, no Nausea, no Vomiting, yes passing gas, . Taking PO well. Denies SOB, Chest or Calf Pain. Using Incentive Spirometer, PAS in place. Ambulate wBAT with pt walking 130 ft with therapy Patient reports pain as  3/10  .    Objective: Vital signs in last 24 hours: Vitals:   02/11/18 0449 02/11/18 1320 02/11/18 2117 02/12/18 0506  BP: 116/67 (!) 141/82 125/72 117/73  Pulse: 81 (!) 103 (!) 109 98  Resp: 14 14 14 18   Temp: 98 F (36.7 C) 98.2 F (36.8 C) 98.9 F (37.2 C) 98.4 F (36.9 C)  TempSrc: Oral Oral Oral Oral  SpO2: 97% 97% 96% 98%  Weight:      Height:          Intake/Output from previous day: I/O last 3 completed shifts: In: 1200 [P.O.:1200] Out: 1950 [Urine:1950]   Intake/Output this shift: No intake/output data recorded.   LABORATORY DATA: Recent Labs    02/11/18 0506  02/11/18 1630 02/11/18 2115 02/12/18 0504 02/12/18 0701  WBC 10.8*  --   --   --  10.9*  --   HGB 13.8  --   --   --  13.6  --   HCT 40.7  --   --   --  38.4*  --   PLT 166  --   --   --  164  --   NA 135  --   --   --   --   --   K 4.4  --   --   --   --   --   CL 102  --   --   --   --   --   CO2 24  --   --   --   --   --   BUN 11  --   --   --   --   --   CREATININE 1.03  --   --   --   --   --   GLUCOSE 149*  --   --   --   --   --   GLUCAP  --    < > 213* 181*  --  136*  CALCIUM 9.4  --   --   --   --   --    < > = values in this interval not displayed.    Examination: Neurologically intact Neurovascular intact Sensation intact distally Intact pulses distally Dorsiflexion/Plantar flexion intact Incision: dressing C/D/I and no drainage No cellulitis present Compartment soft} XR AP&Lat of hip shows well placed\fixed THA  Assessment:   2 Days Post-Op  Procedure(s) (LRB): TOTAL HIP ARTHROPLASTY ANTERIOR APPROACH (Left) ADDITIONAL DIAGNOSIS:  Expected Acute Blood Loss Anemia, Diabetes  Plan: PT/OT WBAT, THA  DVT Prophylaxis: SCDx72 hrs, eliquis 2.5 mg BID x 2 weeks  DISCHARGE PLAN: Skilled Nursing Facility/Rehab, later today  DISCHARGE NEEDS: HHPT, Walker and 3-in-1 comode seat

## 2018-02-12 NOTE — Evaluation (Signed)
Occupational Therapy Evaluation Patient Details Name: Harold Sandoval MRN: [id number] DOB: 08/01/1941 Today's Date: 02/12/2018    History of Present Illness pt is a 77 y/o male with pmh significant for macular degeration L eye, stroke, dm2, balance issues, admittee for elective L THA.   Clinical Impression   Pt reports he was independent with ADL PTA. Currently pt overall min assist for ADL and functional mobility. Recommending SNF for follow up to maximize independence and safety with ADL and functional mobility prior to return home. Pt would benefit from continued skilled OT to address established goals.    Follow Up Recommendations  SNF    Equipment Recommendations  Other (comment)(TBD at next venue)    Recommendations for Other Services       Precautions / Restrictions Restrictions Weight Bearing Restrictions: Yes LLE Weight Bearing: Weight bearing as tolerated      Mobility Bed Mobility Overal bed mobility: Needs Assistance Bed Mobility: Supine to Sit     Supine to sit: Min assist     General bed mobility comments: Assist for LLE to EOB and light assist for trunk elevation to sitting  Transfers Overall transfer level: Needs assistance Equipment used: Rolling walker (2 wheeled) Transfers: Sit to/from Stand Sit to Stand: Min assist         General transfer comment: Cues for hand placement, min assist to boost up from EOB    Balance Overall balance assessment: Needs assistance Sitting-balance support: Feet supported Sitting balance-Leahy Scale: Fair     Standing balance support: No upper extremity supported;During functional activity Standing balance-Leahy Scale: Poor Standing balance comment: Min assist for standing balance without UE support                           ADL either performed or assessed with clinical judgement   ADL Overall ADL's : Needs assistance/impaired Eating/Feeding: Set up;Sitting   Grooming: Minimal  assistance;Standing;Wash/dry face;Oral care Grooming Details (indicate cue type and reason): Min assist for standing balance Upper Body Bathing: Set up;Supervision/ safety;Sitting   Lower Body Bathing: Minimal assistance;Sit to/from stand   Upper Body Dressing : Set up;Supervision/safety;Sitting   Lower Body Dressing: Minimal assistance;Sit to/from stand   Toilet Transfer: Minimal assistance;Ambulation;RW Toilet Transfer Details (indicate cue type and reason): Simulated by sit to stand from EOB with functional mobility         Functional mobility during ADLs: Minimal assistance;Rolling walker       Vision         Perception     Praxis      Pertinent Vitals/Pain Pain Assessment: Faces Faces Pain Scale: Hurts even more Pain Location: L hip Pain Descriptors / Indicators: Aching;Sore Pain Intervention(s): Monitored during session;Limited activity within patient's tolerance;Repositioned;Premedicated before session;Ice applied     Hand Dominance     Extremity/Trunk Assessment Upper Extremity Assessment Upper Extremity Assessment: Overall WFL for tasks assessed   Lower Extremity Assessment Lower Extremity Assessment: Defer to PT evaluation       Communication Communication Communication: No difficulties   Cognition Arousal/Alertness: Awake/alert Behavior During Therapy: WFL for tasks assessed/performed Overall Cognitive Status: Within Functional Limits for tasks assessed                                     General Comments       Exercises     Shoulder Instructions  Home Living Family/patient expects to be discharged to:: Private residence Living Arrangements: Alone Available Help at Discharge: Friend(s) Type of Home: House Home Access: Stairs to enter Secretary/administratorntrance Stairs-Number of Steps: 1 Entrance Stairs-Rails: None Home Layout: One level     Bathroom Shower/Tub: Walk-in shower(Shower seat is older and built in)   FirefighterBathroom Toilet:  Handicapped height     Home Equipment: Information systems managerhower seat - built in          Prior Functioning/Environment Level of Independence: Independent with assistive device(s)                 OT Problem List: Decreased strength;Decreased range of motion;Decreased activity tolerance;Impaired balance (sitting and/or standing);Decreased knowledge of use of DME or AE;Decreased knowledge of precautions;Pain      OT Treatment/Interventions: Self-care/ADL training;Energy conservation;DME and/or AE instruction;Therapeutic activities;Balance training;Patient/family education    OT Goals(Current goals can be found in the care plan section) Acute Rehab OT Goals Patient Stated Goal: go to rehab to get independent befor home OT Goal Formulation: With patient Time For Goal Achievement: 02/26/18 Potential to Achieve Goals: Good ADL Goals Pt Will Perform Lower Body Bathing: with supervision;sit to/from stand Pt Will Perform Lower Body Dressing: with supervision;sit to/from stand Pt Will Transfer to Toilet: with supervision;ambulating;bedside commode Pt Will Perform Toileting - Clothing Manipulation and hygiene: with supervision;sit to/from stand  OT Frequency: Min 2X/week   Barriers to D/C:            Co-evaluation              AM-PAC PT "6 Clicks" Daily Activity     Outcome Measure Help from another person eating meals?: None Help from another person taking care of personal grooming?: A Little Help from another person toileting, which includes using toliet, bedpan, or urinal?: A Little Help from another person bathing (including washing, rinsing, drying)?: A Little Help from another person to put on and taking off regular upper body clothing?: A Little Help from another person to put on and taking off regular lower body clothing?: A Little 6 Click Score: 19   End of Session Equipment Utilized During Treatment: Gait belt;Rolling walker  Activity Tolerance: Patient tolerated treatment  well Patient left: in chair;with call bell/phone within reach;with family/visitor present  OT Visit Diagnosis: Unsteadiness on feet (R26.81);Other abnormalities of gait and mobility (R26.89);Pain Pain - Right/Left: Left Pain - part of body: Hip                Time: [id number] OT Time Calculation (min): 23 min Charges:  OT General Charges $OT Visit: 1 Visit OT Evaluation $OT Eval Moderate Complexity: 1 Mod OT Treatments $Self Care/Home Management : 8-22 mins G-Codes:     Linley Moxley A. Brett Albinooffey, M.S., OTR/L Pager: [phone]  Gaye AlkenBailey A Bliss Behnke 02/12/2018, 9:48 AM

## 2018-02-12 NOTE — Progress Notes (Signed)
Physical Therapy Treatment Patient Details Name: JOHNCARLOS HOLTSCLAW MRN: [id number] DOB: 1941/08/29 Today's Date: 02/12/2018    History of Present Illness pt is a 77 y/o male with pmh significant for macular degeration L eye, stroke, dm2, balance issues, admittee for elective L THA.    PT Comments    Patient is progressing toward mobility goals and tolerated session well. Pt able to recall 2/3 precautions with pt. Continue to progress as tolerated.    Follow Up Recommendations  Follow surgeon's recommendation for DC plan and follow-up therapies;Other (comment)     Equipment Recommendations  Rolling walker with 5" wheels;3in1 (PT)    Recommendations for Other Services OT consult     Precautions / Restrictions Precautions Precautions: Anterior Hip Precaution Comments: pt able to recall 2/3 hip precautions  Restrictions Weight Bearing Restrictions: Yes LLE Weight Bearing: Weight bearing as tolerated    Mobility  Bed Mobility Overal bed mobility: Needs Assistance Bed Mobility: Sit to Supine     Supine to sit: Min assist Sit to supine: Min guard   General bed mobility comments: min guard for safety; cues for techniqe; pt used R LE to assist L LE into bed  Transfers Overall transfer level: Needs assistance Equipment used: Rolling walker (2 wheeled) Transfers: Sit to/from Stand Sit to Stand: Min guard         General transfer comment: cues for safe hand placement  Ambulation/Gait Ambulation/Gait assistance: Min guard Ambulation Distance (Feet): 100 Feet Assistive device: Rolling walker (2 wheeled) Gait Pattern/deviations: Step-to pattern;Step-through pattern;Decreased step length - right;Decreased step length - left;Decreased weight shift to left;Decreased dorsiflexion - left;Antalgic Gait velocity: decreased   General Gait Details: cues for sequencing, increased L hip flexion during swing phase to clear floor, and posture; pt with tendency to vault on R side to aid in  clearing floor with L foot however pt able to correct with cues   Stairs            Wheelchair Mobility    Modified Rankin (Stroke Patients Only)       Balance Overall balance assessment: Needs assistance Sitting-balance support: Feet supported Sitting balance-Leahy Scale: Fair     Standing balance support: No upper extremity supported;During functional activity Standing balance-Leahy Scale: Fair Standing balance comment: Min assist for standing balance without UE support                            Cognition Arousal/Alertness: Awake/alert Behavior During Therapy: WFL for tasks assessed/performed Overall Cognitive Status: Within Functional Limits for tasks assessed                                        Exercises Total Joint Exercises Ankle Circles/Pumps: AROM;10 reps;Both Heel Slides: AAROM;Left;Supine;10 reps    General Comments        Pertinent Vitals/Pain Pain Assessment: 0-10 Pain Score: 6  Faces Pain Scale: Hurts even more Pain Location: L hip and groin  Pain Descriptors / Indicators: Sore;Guarding;Grimacing Pain Intervention(s): Limited activity within patient's tolerance;Monitored during session;Premedicated before session;Repositioned;Ice applied    Home Living Family/patient expects to be discharged to:: Private residence Living Arrangements: Alone Available Help at Discharge: Friend(s) Type of Home: House Home Access: Stairs to enter Entrance Stairs-Rails: None Home Layout: One level Home Equipment: Shower seat - built in      Prior Function Level of Independence: Independent  with assistive device(s)          PT Goals (current goals can now be found in the care plan section) Acute Rehab PT Goals Patient Stated Goal: go to rehab to get independent befor home PT Goal Formulation: With patient Time For Goal Achievement: 02/18/18 Potential to Achieve Goals: Good Progress towards PT goals: Progressing toward  goals    Frequency    7X/week      PT Plan Current plan remains appropriate    Co-evaluation              AM-PAC PT "6 Clicks" Daily Activity  Outcome Measure  Difficulty turning over in bed (including adjusting bedclothes, sheets and blankets)?: A Lot Difficulty moving from lying on back to sitting on the side of the bed? : A Little Difficulty sitting down on and standing up from a chair with arms (e.g., wheelchair, bedside commode, etc,.)?: A Little Help needed moving to and from a bed to chair (including a wheelchair)?: A Little Help needed walking in hospital room?: A Little Help needed climbing 3-5 steps with a railing? : A Little 6 Click Score: 17    End of Session Equipment Utilized During Treatment: Gait belt Activity Tolerance: Patient tolerated treatment well Patient left: in bed;with call bell/phone within reach;with family/visitor present Nurse Communication: Mobility status PT Visit Diagnosis: Unsteadiness on feet (R26.81);Other abnormalities of gait and mobility (R26.89);Pain Pain - Right/Left: Left Pain - part of body: Hip     Time: [id number] PT Time Calculation (min) (ACUTE ONLY): 22 min  Charges:  $Gait Training: 8-22 mins                    G Codes:       Erline LevineKellyn Sohrab Keelan, PTA Pager: [phone]     Carolynne EdouardKellyn R Coolidge Gossard 02/12/2018, 11:22 AM

## 2018-02-12 NOTE — Social Work (Addendum)
CSW contacted SNF-[facility], at [facility] and he indicated that he has not received auth yet from [facility]. SNF will f/u.  CSW will continue to f/u as SNF vs Home, if no auth received. RNCM has initiated equipment as patient was initially going home. Pt walked 100 ft with min assist and is min assist with ADL's.  CSW will continue to follow.  Keene BreathPatricia Lile Mccurley, LCSW Clinical Social Worker [phone]

## 2018-02-12 NOTE — Care Management Note (Signed)
Case Management Note  Patient Details  Name: Loyola MastWade J Miano MRN: [id number] Date of Birth: 05/26/1941  Subjective/Objective:                    Action/Plan:  Patient has not received insurance authorization for SNF . Spoke to patient and Katha Hammingmma White ( significant other ) at bedside.   Home health PT arranged with [facility]imes[facility] has accepted referral.  [facility]  [facility] [address], [place] [id number] . [facility] Home [phone] [facility] cell [phone], [facility] has phone numbers and address.  SW arranging [facility] transport.   Expected Discharge Date:  02/12/18               Expected Discharge Plan:  Home w Home Health Services  In-House Referral:  Clinical Social Work  Discharge planning Services  CM Consult  Post Acute Care Choice:  Durable Medical Equipment, Home Health Choice offered to:  Patient  DME Arranged:  3-N-1, Walker rolling DME Agency:  [facility]  HH Arranged:  PT HH Agency:  [facility]  Status of Service:  Completed, signed off  If discussed at Long Length of Stay Meetings, dates discussed:    Additional Comments:  Kingsley PlanWile, Keerthana Vanrossum Marie, RN 02/12/2018, 4:10 PM

## 2018-02-12 NOTE — Social Work (Signed)
Clinical Social Worker facilitated patient discharge including contacting patient family and facility to confirm patient discharge plans.    CSW arranged ambulance transport via ([facility] [phone],push 1 then 3) to [address], [place].   Clinical Social Worker will sign off for now as social work intervention is no longer needed. Please consult us again if new need arises.  Keene BreathPatricia Saabir Blyth, LCSW Clinical Social Worker [phone]

## 2018-02-12 NOTE — Progress Notes (Signed)
Pt being discharged home via [facility]. Pt alert and oriented x4. VSS. Pt c/o no pain at this time. No signs of respiratory distress. Education complete and care plans resolved. IV removed with catheter intact and pt tolerated well. No further issues at this time. Pt to follow up with PCP. Jillyn HiddenStone,Nashaun Hillmer R, RN

## 2018-02-12 NOTE — Progress Notes (Signed)
Physical Therapy Treatment Patient Details Name: Harold Sandoval MRN: [id number] DOB: Dec 12, 1940 Today's Date: 02/12/2018    History of Present Illness pt is a 77 y/o male with pmh significant for macular degeration L eye, stroke, dm2, balance issues, admittee for elective L THA.    PT Comments    This session focused on gait training and bed mobility. Pt required min guard/min A for sit to stand transfer and min guard for ambulation. Continue to progress as tolerated.    Follow Up Recommendations  Follow surgeon's recommendation for DC plan and follow-up therapies;Other (comment)     Equipment Recommendations  Rolling walker with 5" wheels;3in1 (PT)    Recommendations for Other Services OT consult     Precautions / Restrictions Precautions Precautions: Anterior Hip Precaution Comments: precautions reviewed throughout session Restrictions Weight Bearing Restrictions: Yes LLE Weight Bearing: Weight bearing as tolerated    Mobility  Bed Mobility Overal bed mobility: Needs Assistance Bed Mobility: Supine to Sit       Sit to supine: Min guard   General bed mobility comments: cues for sequencing and technique; use of rail and HOB elevated  Transfers Overall transfer level: Needs assistance Equipment used: Rolling walker (2 wheeled) Transfers: Sit to/from Stand Sit to Stand: Min guard;Min assist         General transfer comment: cues for safe hand placement; min guard to stand and min A upon standing to gain balance; posterior bias upon standing  Ambulation/Gait Ambulation/Gait assistance: Min guard   Assistive device: Rolling walker (2 wheeled) Gait Pattern/deviations: Step-to pattern;Step-through pattern;Decreased step length - left;Decreased weight shift to left;Decreased dorsiflexion - left;Antalgic Gait velocity: decreased   General Gait Details: cues for posture, weight shifting, and L heel strike/toe off; pt with improving step through pattern and step  length symmetry with increased distance   Stairs            Wheelchair Mobility    Modified Rankin (Stroke Patients Only)       Balance     Sitting balance-Leahy Scale: Fair       Standing balance-Leahy Scale: Fair                              Cognition Arousal/Alertness: Awake/alert Behavior During Therapy: WFL for tasks assessed/performed Overall Cognitive Status: Within Functional Limits for tasks assessed                                        Exercises      General Comments        Pertinent Vitals/Pain Pain Assessment: Faces Faces Pain Scale: Hurts little more Pain Location: L hip Pain Descriptors / Indicators: Sore;Guarding Pain Intervention(s): Limited activity within patient's tolerance;Monitored during session;Premedicated before session;Repositioned    Home Living                      Prior Function            PT Goals (current goals can now be found in the care plan section) Acute Rehab PT Goals PT Goal Formulation: With patient Time For Goal Achievement: 02/18/18 Potential to Achieve Goals: Good Progress towards PT goals: Progressing toward goals    Frequency    7X/week      PT Plan Current plan remains appropriate    Co-evaluation  AM-PAC PT "6 Clicks" Daily Activity  Outcome Measure  Difficulty turning over in bed (including adjusting bedclothes, sheets and blankets)?: A Lot Difficulty moving from lying on back to sitting on the side of the bed? : A Little Difficulty sitting down on and standing up from a chair with arms (e.g., wheelchair, bedside commode, etc,.)?: Unable Help needed moving to and from a bed to chair (including a wheelchair)?: A Little Help needed walking in hospital room?: A Little Help needed climbing 3-5 steps with a railing? : A Little 6 Click Score: 15    End of Session Equipment Utilized During Treatment: Gait belt Activity Tolerance:  Patient tolerated treatment well Patient left: in bed;with call bell/phone within reach;with family/visitor present Nurse Communication: Mobility status PT Visit Diagnosis: Unsteadiness on feet (R26.81);Other abnormalities of gait and mobility (R26.89);Pain Pain - Right/Left: Left Pain - part of body: Hip     Time: 1534-1600 PT Time Calculation (min) (ACUTE ONLY): 26 min  Charges:  $Gait Training: 8-22 mins $Therapeutic Activity: 8-22 mins                    G Codes:       Harold Sandoval, PTA Pager: [phone]     Harold Sandoval 02/12/2018, 4:08 PM

## 2018-02-12 NOTE — Discharge Summary (Signed)
Patient ID: Harold Sandoval MRN: [id number] DOB/AGE: 07/18/1941 77 y.o.  Admit date: 02/10/2018 Discharge date: 02/12/2018  Admission Diagnoses:  Principal Problem:   Osteoarthritis of left hip Active Problems:   Primary osteoarthritis of left hip   Discharge Diagnoses:  Same  Past Medical History:  Diagnosis Date  . Arthritis    "all over" (02/10/2018)  . Balance problem    when first stands.  Since stroke.  . Glaucoma, both eyes   . High cholesterol   . History of gout   . 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 Macular degeneration, left eye   . Sinus headache   . Stroke (HCC) 11/24/2015   denies residual on 02/10/2018  . Type II diabetes mellitus (HCC)     Surgeries: Procedure(s): TOTAL HIP ARTHROPLASTY ANTERIOR APPROACH on 02/10/2018   Consultants:   Discharged Condition: Improved  Hospital Course: Harold Sandoval is an 77 y.o. male who was admitted 02/10/2018 for operative treatment ofOsteoarthritis of left hip. Patient has severe unremitting pain that affects sleep, daily activities, and work/hobbies. After pre-op clearance the patient was taken to the operating room on 02/10/2018 and underwent  Procedure(s): TOTAL HIP ARTHROPLASTY ANTERIOR APPROACH.    Patient was given perioperative antibiotics:  Anti-infectives (From admission, onward)   Start     Dose/Rate Route Frequency Ordered Stop   02/10/18 1130  ceFAZolin (ANCEF) IVPB 2g/100 mL premix     2 g 200 mL/hr over 30 Minutes Intravenous To ShortStay Surgical 02/10/18 1003 02/10/18 1318       Patient was given sequential compression devices, early ambulation, and chemoprophylaxis to prevent DVT.  Patient benefited maximally from hospital stay and there were no complications.    Recent vital signs:  Patient Vitals for the past 24 hrs:  BP Temp Temp src Pulse Resp SpO2  02/12/18 0506 117/73 98.4 F (36.9 C) Oral 98 18 98 %  02/11/18 2117 125/72 98.9 F (37.2 C) Oral (!) 109 14 96 %  02/11/18 1320 (!) 141/82  98.2 F (36.8 C) Oral (!) 103 14 97 %     Recent laboratory studies:  Recent Labs    02/11/18 0506 02/12/18 0504  WBC 10.8* 10.9*  HGB 13.8 13.6  HCT 40.7 38.4*  PLT 166 164  NA 135  --   K 4.4  --   CL 102  --   CO2 24  --   BUN 11  --   CREATININE 1.03  --   GLUCOSE 149*  --   CALCIUM 9.4  --      Discharge Medications:   Allergies as of 02/12/2018      Reactions   Lactose Intolerance (gi) Other (See Comments)   Stomach upset   Lactulose Nausea Only   Stomach upset      Medication List    STOP taking these medications   acetaminophen 500 MG tablet Commonly known as:  TYLENOL   polyethylene glycol 236 g solution Commonly known as:  GOLYTELY     TAKE these medications   apixaban 2.5 MG Tabs tablet Commonly known as:  ELIQUIS Take 1 tablet (2.5 mg total) by mouth 2 (two) times daily. What changed:    medication strength  how much to take   atorvastatin 80 MG tablet Commonly known as:  LIPITOR Take 80 mg by mouth at bedtime.   brimonidine 0.2 % ophthalmic solution Commonly known as:  ALPHAGAN Place 1 drop into both eyes 2 (two) times daily.   brinzolamide  1 % ophthalmic suspension Commonly known as:  AZOPT Place 1 drop into both eyes 2 (two) times daily.   desonide 0.05 % lotion Commonly known as:  DESOWEN Apply 1 application topically 2 (two) times daily.   fluticasone 50 MCG/ACT nasal spray Commonly known as:  FLONASE Place 2 sprays into both nostrils daily as needed for rhinitis.   gabapentin 600 MG tablet Commonly known as:  NEURONTIN Take 600 mg by mouth 2 (two) times daily.   latanoprost 0.005 % ophthalmic solution Commonly known as:  XALATAN Place 1 drop into both eyes at bedtime.   metFORMIN 500 MG 24 hr tablet Commonly known as:  GLUCOPHAGE-XR Take 500 mg by mouth daily with supper.   oxyCODONE-acetaminophen 5-325 MG tablet Commonly known as:  PERCOCET/ROXICET Take 1 tablet by mouth every 4 (four) hours as needed for severe  pain.   senna 8.6 MG tablet Commonly known as:  SENOKOT Take 2 tablets by mouth daily as needed for constipation.   tiZANidine 2 MG tablet Commonly known as:  ZANAFLEX Take 1 tablet (2 mg total) by mouth every 6 (six) hours as needed for muscle spasms.   Vitamin D3 2000 units capsule Take 2,000 Units by mouth daily.            Durable Medical Equipment  (From admission, onward)        Start     Ordered   02/10/18 1912  DME Walker rolling  Once    Question:  Patient needs a walker to treat with the following condition  Answer:  Status post total hip replacement, left   02/10/18 1912   02/10/18 1912  DME 3 n 1  Once     02/10/18 1912   02/10/18 1912  DME Bedside commode  Once    Question:  Patient needs a bedside commode to treat with the following condition  Answer:  Status post total hip replacement, left   02/10/18 1912      Diagnostic Studies: Dg Chest 2 View  Result Date: 02/03/2018 CLINICAL DATA:  Preop total hip arthroplasty EXAM: CHEST  2 VIEW COMPARISON:  05/12/2016 FINDINGS: Electronic recording device over the left chest. Hyperinflated lungs. No acute consolidation or effusion. Normal cardiomediastinal silhouette. No pneumothorax. Degenerative changes of the spine. IMPRESSION: No active cardiopulmonary disease. Electronically Signed   By: Jasmine Pang M.D.   On: 02/03/2018 16:05   Dg C-arm 1-60 Min  Result Date: 02/10/2018 CLINICAL DATA:  Total left hip arthroplasty from anterior approach. EXAM: DG C-ARM 61-120 MIN; OPERATIVE LEFT HIP WITH PELVIS COMPARISON:  None. FINDINGS: 41 seconds of fluoroscopic time was utilized during placement of an uncemented left total hip arthroplasty. No immediate intraoperative complications are recognized. Intraoperative soft tissue emphysema about the left hip is noted. IMPRESSION: Fluoroscopic time utilized for left total uncemented hip arthroplasty. Electronically Signed   By: Tollie Eth M.D.   On: 02/10/2018 15:22   Dg Hip  Operative Unilat With Pelvis Left  Result Date: 02/10/2018 CLINICAL DATA:  Total left hip arthroplasty from anterior approach. EXAM: DG C-ARM 61-120 MIN; OPERATIVE LEFT HIP WITH PELVIS COMPARISON:  None. FINDINGS: 41 seconds of fluoroscopic time was utilized during placement of an uncemented left total hip arthroplasty. No immediate intraoperative complications are recognized. Intraoperative soft tissue emphysema about the left hip is noted. IMPRESSION: Fluoroscopic time utilized for left total uncemented hip arthroplasty. Electronically Signed   By: Tollie Eth M.D.   On: 02/10/2018 15:22    Disposition: SNF  Discharge  Instructions    Call MD / Call 911   Complete by:  As directed    If you experience chest pain or shortness of breath, CALL 911 and be transported to the hospital emergency room.  If you develope a fever above 101 F, pus (white drainage) or increased drainage or redness at the wound, or calf pain, call your surgeon's office.   Constipation Prevention   Complete by:  As directed    Drink plenty of fluids.  Prune juice may be helpful.  You may use a stool softener, such as Colace (over the counter) 100 mg twice a day.  Use MiraLax (over the counter) for constipation as needed.   Diet - low sodium heart healthy   Complete by:  As directed    Driving restrictions   Complete by:  As directed    No driving for 2 weeks   Follow the hip precautions as taught in Physical Therapy   Complete by:  As directed    Increase activity slowly as tolerated   Complete by:  As directed    Patient may shower   Complete by:  As directed    You may shower without a dressing once there is no drainage.  Do not wash over the wound.  If drainage remains, cover wound with plastic wrap and then shower.       Contact information for follow-up providers    Gean Birchwood, MD Follow up in 2 week(s).   Specialty:  Orthopedic Surgery Contact information: [address] [place]  [address] [phone]            Contact information for after-discharge care    Destination    [facility] Follow up.   Service:  Skilled Nursing Contact information: [address] [place] [address] [phone]                   Signed: Dannielle Burn 02/12/2018, 7:28 AM

## 2018-02-12 NOTE — Care Management Note (Signed)
Case Management Note  Patient Details  Name: Loyola MastWade J Forlenza MRN: [id number] Date of Birth: [birth year]  Subjective/Objective:   77 yr old gentleman s/p left total hip arthroplasty, anterior approach.                 Action/Plan: Patient was preoperatively setup with  setup with [facility], they could not provide service. [facility]. On 02/11/17  patient said that he needed to go to SNF.  Case manager notified Social worker of same.  Should patient decide to go home he has a lady friend that can assist him.    Expected Discharge Date:  02/12/18               Expected Discharge Plan:  Home w Home Health Services  In-House Referral:  Clinical Social Work  Discharge planning Services  CM Consult  Post Acute Care Choice:  Durable Medical Equipment, Home Health Choice offered to:  Patient  DME Arranged:  3-N-1, Walker rolling DME Agency:  [facility]  [facility]  Status of Service:  In process, will continue to follow  If discussed at Long Length of Stay Meetings, dates discussed:    Additional Comments:  Durenda GuthrieBrady, Garnie Borchardt Naomi, RN 02/12/2018, 1:59 PM

## 2018-03-09 ENCOUNTER — Encounter: Payer: Self-pay | Admitting: Physical Therapy

## 2018-03-09 ENCOUNTER — Other Ambulatory Visit: Payer: Self-pay

## 2018-03-09 ENCOUNTER — Ambulatory Visit: Payer: Medicare HMO | Attending: Orthopedic Surgery | Admitting: Physical Therapy

## 2018-03-09 DIAGNOSIS — M6281 Muscle weakness (generalized): Secondary | ICD-10-CM | POA: Diagnosis present

## 2018-03-09 DIAGNOSIS — R262 Difficulty in walking, not elsewhere classified: Secondary | ICD-10-CM | POA: Insufficient documentation

## 2018-03-09 NOTE — Therapy (Signed)
[place] [facility] MAIN [facility] SERVICES [address] [place], [place], [address] Phone: [phone]   Fax:  [phone]  Physical Therapy Evaluation  Patient Details  Name: Harold Sandoval MRN: [id number] Date of Birth: [birth year] Referring Provider: Gean Birchwood   Encounter Date: 03/09/2018  PT End of Session - 03/09/18 0902    Visit Number  1    Number of Visits  25    Date for PT Re-Evaluation  06/01/18    PT Start Time  0845    PT Stop Time  0945    PT Time Calculation (min)  60 min    Equipment Utilized During Treatment  Gait belt    Activity Tolerance  Patient tolerated treatment well;Patient limited by pain    Behavior During Therapy  [facility] for tasks assessed/performed       Past Medical History:  Diagnosis Date  . Arthritis    "all over" (02/10/2018)  . Balance problem    when first stands.  Since stroke.  . Glaucoma, both eyes   . High cholesterol   . History of gout   . 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 Macular degeneration, left eye   . Sinus headache   . Stroke (HCC) 11/24/2015   denies residual on 02/10/2018  . Type II diabetes mellitus (HCC)     Past Surgical History:  Procedure Laterality Date  . APPENDECTOMY    . BACK SURGERY    . CATARACT EXTRACTION, BILATERAL    . COLONOSCOPY WITH PROPOFOL N/A 12/29/2016   Procedure: COLONOSCOPY WITH PROPOFOL;  Surgeon: Midge Minium, MD;  Location: [facility] [place] [facility];  Service: Endoscopy;  Laterality: N/A;  Diabetic - oral meds  . JOINT REPLACEMENT    . LAPAROSCOPIC CHOLECYSTECTOMY    . LOOP RECORDER INSERTION N/A 02/24/2017   Procedure: Loop Recorder Insertion;  Surgeon: Marcina Millard, MD;  Location: [facility] INVASIVE CV LAB;  Service: Cardiovascular;  Laterality: N/A;  . LUMBAR DISC SURGERY    . 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 TOTAL HIP ARTHROPLASTY Left 02/10/2018   Procedure: TOTAL HIP ARTHROPLASTY ANTERIOR APPROACH;  Surgeon: Gean Birchwood, MD;  Location: [facility] OR;   Service: Orthopedics;  Laterality: Left;    There were no vitals filed for this visit.   Subjective Assessment - 03/09/18 0851    Subjective  Patient was limping and hurting for a long time prior to surgery.     Pertinent History  Patinet had several cortisone shots to left hip and in left knee. His surgery was 02/10/18 at [facility] and was in the hospital and then he went home 02/12/18.  He was using  a RW and had HHPT 2 x / week. He now is using a SPT.     Limitations  Walking;Standing    How long can you sit comfortably?  as long as he wants to    How long can you stand comfortably?   5 mins     How long can you walk comfortably?  5 mins    Patient Stated Goals  Patient wants to be able to walk without the can and mow his lawn and weed it.     Currently in Pain?  Yes    Pain Score  6     Pain Location  Knee    Pain Orientation  Left    Pain Descriptors / Indicators  Aching    Pain Type  Chronic pain    Pain Radiating Towards  left knee    Pain Onset  More than a month ago    Pain Frequency  Constant    Aggravating Factors   walking    Pain Relieving Factors  pain medicine    Effect of Pain on Daily Activities  unable to walk as long    Multiple Pain Sites  No         [facility] PT Assessment - 03/09/18 0858      Assessment   Medical Diagnosis  Left THR    Referring Provider  Gean Birchwood    Onset Date/Surgical Date  02/10/18    Hand Dominance  Right    Next MD Visit  03/27/18    Prior Therapy  HHPT      Precautions   Precautions  Posterior Hip      Restrictions   Weight Bearing Restrictions  No      Balance Screen   Has the patient fallen in the past 6 months  No    Has the patient had a decrease in activity level because of a fear of falling?   Yes    Is the patient reluctant to leave their home because of a fear of falling?   No      Home Nurse, mental health  Private residence    Living Arrangements  Spouse/significant other    Available Help at  Discharge  Family;Friend(s)    Type of Home  House    Home Access  Stairs to enter    Entrance Stairs-Number of Steps  3    Entrance Stairs-Rails  Right;Left;Cannot reach both    Home Layout  One level    Home Equipment  Walker - 2 wheels;Cane - single point;Grab bars - tub/shower;Toilet riser      Prior Function   Level of Independence  Independent with basic ADLs;Independent with household mobility with device    Vocation  Retired    Publishing copy   Overall Cognitive Status  Within Functional Limits for tasks assessed       PAIN: Patient has 0/10 pain in left hip during standing and walking and patient has pain that increases if his LLE trys to get into extension in supine position  POSTURE: trunk flex in standing, hip flex in standing    PROM/AROM: PROM is limited in left hip extension - 30 degs; Patient has posterior hip precautions   STRENGTH:  Graded on a 0-5 scale Muscle Group Left Right                          Hip Flex -3/5   Hip Abd -3/5   Hip Add -3/5   Hip Ext NT    Hip IR/ER NT/ 3/5   Knee Flex 4/5   Knee Ext 4/5   Ankle DF 4/5   Ankle PF 4/5    SENSATION: WNL BLE   FUNCTIONAL MOBILITY: guarded and independent with transfers with use of UE, independent with bed mobility sit to supine; supine to sit ; independent with rolling Patient is unable to extend the LLE flat on the bed.   BALANCE: unable to single leg stand, unable to tandem stand    GAIT: Patient has decreased gait speed with antalgic gait and limp with SPC for 500 feet  OUTCOME MEASURES: TEST Outcome Interpretation      10 meter walk test   .47  m/s <1.0 m/s indicates increased risk for falls; limited community ambulator  Timed up and Go  24.61               sec <14 sec indicates increased risk for falls  6 minute walk test     530           Feet 1000 feet is Automotive engineercommunity ambulator            Therapeutic activities; MH to left hip groin and adductor  muscles x 15 mins Reviewed hip precautions for posterior hip including limited flexion to 70 degs, No IR and No adduction Reviewed need to use raised seating and elevated commode  Instructed in heat to left groin for muscle pain in L hip adductors Reviewed HEP from HHPT : heel raises, sidestepping, hip abd standing, gait indoors with spc.          Objective measurements completed on examination: See above findings.                PT Short Term Goals - 03/09/18 1101      PT SHORT TERM GOAL #1   Title  Patient will increase BLE gross strength to 4+/5 as to improve functional strength for independent gait, increased standing tolerance and increased ADL ability.    Time  6    Period  Weeks    Status  New    Target Date  04/20/18      PT SHORT TERM GOAL #2   Title  Patient will be able to perform household work/ chores without increase in symptoms.    Time  6    Period  Weeks    Status  New    Target Date  04/20/18        PT Long Term Goals - 03/09/18 0953      PT LONG TERM GOAL #1   Title  Patient will be independent in home exercise program to improve strength/mobility for better functional independence with ADLs.    Time  12    Status  New    Target Date  06/01/18      PT LONG TERM GOAL #2   Title  Patient will increase six minute walk test distance to >1000 for progression to community ambulator and improve gait ability    Time  12    Period  Weeks    Status  New    Target Date  06/01/18      PT LONG TERM GOAL #3   Title  Patient will increase 10 meter walk test to >1.6136m/s as to improve gait speed for better community ambulation and to reduce fall risk.    Time  12    Period  Weeks    Status  New    Target Date  06/01/18      PT LONG TERM GOAL #4   Title  Patient will reduce timed up and go to <11 seconds to reduce fall risk and demonstrate improved transfer/gait ability.    Time  12    Period  Weeks    Status  New    Target Date  06/01/18       PT LONG TERM GOAL #5   Title  Patient will report a worst pain of 3/10 on VAS in  left hip           to improve tolerance with ADLs and reduced symptoms with activities.     Time  12  Period  Weeks    Status  New    Target Date  06/01/18             Plan - 03/09/18 1111    Clinical Impression Statement  Patient is 77 yr old male s/p left THR 02/10/18. He reports having pain during supine with leg extension. He has decreased left hip extension - 30 degrees. He has decreased left hip strength -3/5. He has decreased gait speed with spc for short and intermediate distances. He has decreased bed mobility and decreased outcome meausres. He will benefit from skilled PT to improve gait speed and mobility and return to PLOF.    Rehab Potential  Good    PT Frequency  2x / week    PT Duration  12 weeks    PT Treatment/Interventions  Cryotherapy;Electrical Stimulation;Moist Heat;Gait training;Ultrasound;Stair training;Functional mobility training;Therapeutic exercise;Therapeutic activities;Balance training;Neuromuscular re-education;Patient/family education;Manual techniques;Passive range of motion;Dry needling    PT Next Visit Plan  heat, e-stim, [place], manual therpay, therapeutic exercise    PT Home Exercise Plan  heat, heel raises standing , hip abd    Consulted and Agree with Plan of Care  Patient       Patient will benefit from skilled therapeutic intervention in order to improve the following deficits and impairments:  Abnormal gait, Decreased endurance, Decreased mobility, Difficulty walking, Pain, Impaired flexibility, Decreased strength, Decreased safety awareness, Decreased activity tolerance  Visit Diagnosis: Difficulty in walking, not elsewhere classified  Muscle weakness (generalized)     Problem List Patient Active Problem List   Diagnosis Date Noted  . 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 Osteoarthritis of left hip 02/09/2018  . Constipation   . Change in  bowel habits   . Chest pain 05/12/2016  . Strain of rectus abdominis muscle 08/01/2015    Ezekiel Ina, Ruth DPT 03/09/2018, 11:12 AM  [place] [facility] MAIN [facility] SERVICES [address] [place], [place], [address] Phone: [phone]   Fax:  [phone]  Name: TYSHEEM ACCARDO MRN: [id number] Date of Birth: [birth year]

## 2018-03-11 ENCOUNTER — Ambulatory Visit: Payer: Medicare HMO

## 2018-03-11 DIAGNOSIS — R262 Difficulty in walking, not elsewhere classified: Secondary | ICD-10-CM

## 2018-03-11 DIAGNOSIS — M6281 Muscle weakness (generalized): Secondary | ICD-10-CM

## 2018-03-11 NOTE — Therapy (Signed)
[facility] MAIN [facility] SERVICES [address] [place], [place], [address] Phone: [phone]   Fax:  [phone]  Physical Therapy Treatment  Patient Details  Name: Harold Sandoval MRN: [id number] Date of Birth: [birth year] Referring Provider: Gean Birchwood   Encounter Date: 03/11/2018  PT End of Session - 03/11/18 1657    Visit Number  2    Number of Visits  25    Date for PT Re-Evaluation  06/01/18    PT Start Time  1601    PT Stop Time  1645    PT Time Calculation (min)  44 min    Equipment Utilized During Treatment  Gait belt    Activity Tolerance  Patient tolerated treatment well;Patient limited by pain    Behavior During Therapy  [facility] for tasks assessed/performed       Past Medical History:  Diagnosis Date  . Arthritis    "all over" (02/10/2018)  . Balance problem    when first stands.  Since stroke.  . Glaucoma, both eyes   . High cholesterol   . History of gout   . 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 Macular degeneration, left eye   . Sinus headache   . Stroke (HCC) 11/24/2015   denies residual on 02/10/2018  . Type II diabetes mellitus (HCC)     Past Surgical History:  Procedure Laterality Date  . APPENDECTOMY    . BACK SURGERY    . CATARACT EXTRACTION, BILATERAL    . COLONOSCOPY WITH PROPOFOL N/A 12/29/2016   Procedure: COLONOSCOPY WITH PROPOFOL;  Surgeon: Midge Minium, MD;  Location: [facility];  Service: Endoscopy;  Laterality: N/A;  Diabetic - oral meds  . JOINT REPLACEMENT    . LAPAROSCOPIC CHOLECYSTECTOMY    . LOOP RECORDER INSERTION N/A 02/24/2017   Procedure: Loop Recorder Insertion;  Surgeon: Marcina Millard, MD;  Location: [facility] INVASIVE CV LAB;  Service: Cardiovascular;  Laterality: N/A;  . LUMBAR DISC SURGERY    . 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 TOTAL HIP ARTHROPLASTY Left 02/10/2018   Procedure: TOTAL HIP ARTHROPLASTY ANTERIOR APPROACH;  Surgeon: Gean Birchwood, MD;  [facility]: [facility] OR;   Service: Orthopedics;  Laterality: Left;    There were no vitals filed for this visit.  Subjective Assessment - 03/11/18 1605    Subjective  Patient overwalked yesterday and is having continued groin pain.     Pertinent History  Patinet had several cortisone shots to left hip and in left knee. His surgery was 02/10/18 at [place] and was in the hospital and then he went home 02/12/18.  He was using  a RW and had HHPT 2 x / week. He now is using a SPT.     Limitations  Walking;Standing    How long can you sit comfortably?  as long as he wants to    How long can you stand comfortably?   5 mins     How long can you walk comfortably?  5 mins    Patient Stated Goals  Patient wants to be able to walk without the can and mow his lawn and weed it.     Currently in Pain?  Yes    Pain Score  4     Pain Location  Leg    Pain Orientation  Left    Pain Descriptors / Indicators  Aching    Pain Type  Acute pain    Pain Onset  More than a month  ago    Pain Frequency  Constant        Supine TrA activation 10x 3 second holds.  Clamshells 10x OTB  Heel slides 10x LLE,   PROM:  adduction stretch: 1 minute: hooklying  Windshield wipers 2 minutes  Standing:  Side stepping in // bars: painful in adductors and terminated  Walking backwards 6x length of bars cues for upright posture Stand on Airex pad 1 minutes no LOB  Heel raises 15x with BUE support  Standing marches 20x , 10x without UE support Weight shift 10x  With knee bends in // bars 10x STS good weight shift to L 10x STS from plinth raising weighted ball 2000G.                         PT Education - 03/11/18 1655    Education provided  Yes    Education Details  Standing interventions, exercise technique     Person(s) Educated  Patient    Methods  Explanation;Demonstration;Verbal cues    Comprehension  Verbalized understanding;Returned demonstration       PT Short Term Goals - 03/09/18 1101      PT SHORT TERM  GOAL #1   Title  Patient will increase BLE gross strength to 4+/5 as to improve functional strength for independent gait, increased standing tolerance and increased ADL ability.    Time  6    Period  Weeks    Status  New    Target Date  04/20/18      PT SHORT TERM GOAL #2   Title  Patient will be able to perform household work/ chores without increase in symptoms.    Time  6    Period  Weeks    Status  New    Target Date  04/20/18        PT Long Term Goals - 03/09/18 0953      PT LONG TERM GOAL #1   Title  Patient will be independent in home exercise program to improve strength/mobility for better functional independence with ADLs.    Time  12    Status  New    Target Date  06/01/18      PT LONG TERM GOAL #2   Title  Patient will increase six minute walk test distance to >1000 for progression to community ambulator and improve gait ability    Time  12    Period  Weeks    Status  New    Target Date  06/01/18      PT LONG TERM GOAL #3   Title  Patient will increase 10 meter walk test to >1.4466m/s as to improve gait speed for better community ambulation and to reduce fall risk.    Time  12    Period  Weeks    Status  New    Target Date  06/01/18      PT LONG TERM GOAL #4   Title  Patient will reduce timed up and go to <11 seconds to reduce fall risk and demonstrate improved transfer/gait ability.    Time  12    Period  Weeks    Status  New    Target Date  06/01/18      PT LONG TERM GOAL #5   Title  Patient will report a worst pain of 3/10 on VAS in  left hip           to improve tolerance with ADLs  and reduced symptoms with activities.     Time  12    Period  Weeks    Status  New    Target Date  06/01/18            Plan - 03/11/18 1703    Clinical Impression Statement  Patient exacerbated adductor pain from over exertion yesterday. Adductor musculature excessively tight with limited muscle tissue lengthening available. Hamstring muscle tissue length  additionally decreased with limited extension. Patient will continue to benefit from skilled physical therapy to improve gait speed and mobility and return to PLOF.     Rehab Potential  Good    PT Frequency  2x / week    PT Duration  12 weeks    PT Treatment/Interventions  Cryotherapy;Electrical Stimulation;Moist Heat;Gait training;Ultrasound;Stair training;Functional mobility training;Therapeutic exercise;Therapeutic activities;Balance training;Neuromuscular re-education;Patient/family education;Manual techniques;Passive range of motion;Dry needling    PT Next Visit Plan  heat, e-stim, [place], manual therpay, therapeutic exercise    PT Home Exercise Plan  heat, heel raises standing , hip abd    Consulted and Agree with Plan of Care  Patient       Patient will benefit from skilled therapeutic intervention in order to improve the following deficits and impairments:  Abnormal gait, Decreased endurance, Decreased mobility, Difficulty walking, Pain, Impaired flexibility, Decreased strength, Decreased safety awareness, Decreased activity tolerance  Visit Diagnosis: Difficulty in walking, not elsewhere classified  Muscle weakness (generalized)     Problem List Patient Active Problem List   Diagnosis Date Noted  . 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 Osteoarthritis of left hip 02/09/2018  . Constipation   . Change in bowel habits   . Chest pain 05/12/2016  . Strain of rectus abdominis muscle 08/01/2015   Precious Bard, PT, DPT   Precious Bard 03/11/2018, 5:03 PM  [facility] MAIN [facility] SERVICES [address] [place], [place], [address] Phone: [phone]   Fax:  [phone]  Name: Harold Sandoval MRN: [id number] Date of Birth: 02/18/1941

## 2018-03-15 ENCOUNTER — Other Ambulatory Visit: Payer: Self-pay

## 2018-03-15 ENCOUNTER — Emergency Department
Admission: EM | Admit: 2018-03-15 | Discharge: 2018-03-15 | Disposition: A | Discharge status: Home / Self Care | Payer: Medicare HMO | Attending: Emergency Medicine | Admitting: Emergency Medicine

## 2018-03-15 ENCOUNTER — Encounter: Payer: Self-pay | Admitting: Physical Therapy

## 2018-03-15 ENCOUNTER — Emergency Department: Payer: Medicare HMO

## 2018-03-15 ENCOUNTER — Ambulatory Visit: Payer: Medicare HMO | Admitting: Physical Therapy

## 2018-03-15 ENCOUNTER — Encounter: Payer: Self-pay | Admitting: Emergency Medicine

## 2018-03-15 DIAGNOSIS — Z9049 Acquired absence of other specified parts of digestive tract: Secondary | ICD-10-CM | POA: Diagnosis not present

## 2018-03-15 DIAGNOSIS — Z7984 Long term (current) use of oral hypoglycemic drugs: Secondary | ICD-10-CM | POA: Diagnosis not present

## 2018-03-15 DIAGNOSIS — Z79899 Other long term (current) drug therapy: Secondary | ICD-10-CM | POA: Insufficient documentation

## 2018-03-15 DIAGNOSIS — Z7901 Long term (current) use of anticoagulants: Secondary | ICD-10-CM | POA: Insufficient documentation

## 2018-03-15 DIAGNOSIS — M79605 Pain in left leg: Secondary | ICD-10-CM | POA: Diagnosis present

## 2018-03-15 DIAGNOSIS — Z8673 Personal history of transient ischemic attack (TIA), and cerebral infarction without residual deficits: Secondary | ICD-10-CM | POA: Diagnosis not present

## 2018-03-15 DIAGNOSIS — R262 Difficulty in walking, not elsewhere classified: Secondary | ICD-10-CM

## 2018-03-15 DIAGNOSIS — Z96642 Presence of left artificial hip joint: Secondary | ICD-10-CM | POA: Diagnosis not present

## 2018-03-15 DIAGNOSIS — M6281 Muscle weakness (generalized): Secondary | ICD-10-CM

## 2018-03-15 DIAGNOSIS — E119 Type 2 diabetes mellitus without complications: Secondary | ICD-10-CM | POA: Insufficient documentation

## 2018-03-15 NOTE — ED Triage Notes (Signed)
Pain to left thigh and swelling to left knee. Left hip surgery one month ago. Started shortly after surgery. Sent to make sure doesn't have dvt.  NAD. Unlabored.

## 2018-03-15 NOTE — ED Provider Notes (Signed)
[facility] Emergency Department Provider Note ____________________________________________   First MD Initiated Contact with Patient 03/15/18 1844     (approximate)  I have reviewed the triage vital signs and the nursing notes.   HISTORY  Chief Complaint Leg Pain    HPI Harold Sandoval is a 77 y.o. male with past medical history as noted below and who is status post left hip arthroplasty 1 month ago who presents with left leg pain, increased in the last few days, gradual onset, radiating down the thigh to the knee, and worse around the knee and the popliteal area.  Patient denies any new trauma, but he states he has been walking more than he previously did in the last few days, including running several errands outside the other day.  No weakness or numbness.  No rash or swelling.   Past Medical History:  Diagnosis Date  . Arthritis    "all over" (02/10/2018)  . Balance problem    when first stands.  Since stroke.  . Glaucoma, both eyes   . High cholesterol   . History of gout   . 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 Macular degeneration, left eye   . Sinus headache   . Stroke (HCC) 11/24/2015   denies residual on 02/10/2018  . Type II diabetes mellitus [facility])     Patient Active Problem List   Diagnosis Date Noted  . 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 Osteoarthritis of left hip 02/09/2018  . Constipation   . Change in bowel habits   . Chest pain 05/12/2016  . Strain of rectus abdominis muscle 08/01/2015    Past Surgical History:  Procedure Laterality Date  . APPENDECTOMY    . BACK SURGERY    . CATARACT EXTRACTION, BILATERAL    . COLONOSCOPY WITH PROPOFOL N/A 12/29/2016   Procedure: COLONOSCOPY WITH PROPOFOL;  Surgeon: Midge Minium, MD;  Location: [facility];  Service: Endoscopy;  Laterality: N/A;  Diabetic - oral meds  . JOINT REPLACEMENT    . LAPAROSCOPIC CHOLECYSTECTOMY    . LOOP RECORDER INSERTION N/A 02/24/2017   Procedure: Loop Recorder Insertion;  Surgeon: Marcina Millard, MD;  Location: [facility] INVASIVE CV LAB;  Service: Cardiovascular;  Laterality: N/A;  . LUMBAR DISC SURGERY    . 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 TOTAL HIP ARTHROPLASTY Left 02/10/2018   Procedure: TOTAL HIP ARTHROPLASTY ANTERIOR APPROACH;  Surgeon: Gean Birchwood, MD;  Location: [facility] OR;  Service: Orthopedics;  Laterality: Left;    Prior to Admission medications   Medication Sig Start Date End Date Taking? Authorizing Provider  apixaban (ELIQUIS) 2.5 MG TABS tablet Take 1 tablet (2.5 mg total) by mouth 2 (two) times daily. 02/10/18   Allena Katz, PA-C  atorvastatin (LIPITOR) 80 MG tablet Take 80 mg by mouth at bedtime.    [provider]  brimonidine (ALPHAGAN) 0.2 % ophthalmic solution Place 1 drop into both eyes 2 (two) times daily.     [provider]  brinzolamide (AZOPT) 1 % ophthalmic suspension Place 1 drop into both eyes 2 (two) times daily.    [provider]  Cholecalciferol (VITAMIN D3) 2000 units capsule Take 2,000 Units by mouth daily.    [provider]  desonide (DESOWEN) 0.05 % lotion Apply 1 application topically 2 (two) times daily.    [provider]  fluticasone (FLONASE) 50 MCG/ACT nasal spray Place 2 sprays into both nostrils daily as needed for rhinitis.  [provider]  gabapentin (NEURONTIN) 600 MG tablet Take 600 mg by mouth 2 (two) times daily.    [provider]  latanoprost (XALATAN) 0.005 % ophthalmic solution Place 1 drop into both eyes at bedtime.     [provider]  metFORMIN (GLUCOPHAGE-XR) 500 MG 24 hr tablet Take 500 mg by mouth daily with supper.    [provider]  oxyCODONE-acetaminophen (PERCOCET/ROXICET) 5-325 MG tablet Take 1 tablet by mouth every 4 (four) hours as needed for severe pain. 02/10/18   Allena Katz, PA-C  senna (SENOKOT) 8.6 MG tablet Take 2 tablets by mouth daily as needed for  constipation.    [provider]  tiZANidine (ZANAFLEX) 2 MG tablet Take 1 tablet (2 mg total) by mouth every 6 (six) hours as needed for muscle spasms. 02/10/18   Allena Katz, PA-C    Allergies Patient has no known allergies.  Family History  Problem Relation Age of Onset  . Cancer Mother   . Cancer Father     Social History Social History   Tobacco Use  . Smoking status: Never Smoker  . Smokeless tobacco: Never Used  Substance Use Topics  . Alcohol use: No    Alcohol/week: 0.0 oz  . Drug use: No    Review of Systems  Constitutional: No fever. Cardiovascular: Denies chest pain. Respiratory: Denies shortness of breath. Gastrointestinal: No vomiting.  Musculoskeletal: Positive for left leg pain. Skin: Negative for rash. Neurological: Negative for focal weakness or numbness.   ____________________________________________   PHYSICAL EXAM:  VITAL SIGNS: ED Triage Vitals [03/15/18 1723]  Enc Vitals Group     BP 119/75     Pulse Rate 100     Resp 18     Temp 98.2 F (36.8 C)     Temp Source Oral     SpO2 98 %     Weight 190 lb (86.2 kg)     Height 6\' 4"  (1.93 m)     Head Circumference      Peak Flow      Pain Score 8     Pain Loc      Pain Edu?      Excl. in GC?     Constitutional: Alert and oriented. Well appearing and in no acute distress. Eyes: Conjunctivae are normal.  Head: Atraumatic. Nose: No congestion/rhinnorhea. Mouth/Throat: Mucous membranes are moist.   Neck: Normal range of motion.  Cardiovascular:  Good peripheral circulation. Respiratory: Normal respiratory effort.  Gastrointestinal: No distention.  Musculoskeletal: No lower extremity edema.  Extremities warm and well perfused.  Left lower leg with some mild tenderness around the left knee.  No erythema, induration, or warmth.  No effusion.  No popliteal swelling or tenderness. Neurologic:  Normal speech and language. No gross focal neurologic deficits are appreciated.  Fine  touch sensation and motor intact to the left lower extremity. Skin:  Skin is warm and dry. No rash noted. Psychiatric: Mood and affect are normal. Speech and behavior are normal.  ____________________________________________   LABS (all labs ordered are listed, but only abnormal results are displayed)  Labs Reviewed - No data to display ____________________________________________  EKG   ____________________________________________  RADIOLOGY  [place] LLE: No evidence of acute DVT  ____________________________________________   PROCEDURES  Procedure(s) performed: No  Procedures  Critical Care performed: No ____________________________________________   INITIAL IMPRESSION / ASSESSMENT AND PLAN / ED COURSE  Pertinent labs & imaging results that were available during my care of the patient  were reviewed by me and considered in my medical decision making (see chart for details).  77 year old male status post left hip arthroplasty 1 month ago presents with increased left leg pain, primarily in the distal thigh and around the knee.  No new trauma, however patient's son states that he did increase his level of activity fairly rapidly in the last few days.  On exam, the patient is well-appearing, vital signs are normal, and the remainder the exam is as described above.  There is no rash, warmth, effusion, or evidence of infectious process.  There was clinical concern by his doctors for DVT.  DVT study is negative.  With the negative ultrasound, differential includes either referred pain from the hip, edema related to the surgery, or possibly knee arthritis due to his increased activity.  I advised for him to take his postoperative pain medication, and gave him RICE instructions.  I also gave return precautions, the patient expresses understanding.  He feels well to go home.      ____________________________________________   FINAL CLINICAL IMPRESSION(S) / ED DIAGNOSES  Final  diagnoses:  Left leg pain      NEW MEDICATIONS STARTED DURING THIS VISIT:  New Prescriptions   No medications on file     Note:  This document was prepared using Dragon voice recognition software and may include unintentional dictation errors.    Dionne BucySiadecki, Quinton Voth, MD 03/15/18 [phone]

## 2018-03-15 NOTE — ED Notes (Signed)
esignature signed by pt.  D/c inst to pt.  

## 2018-03-15 NOTE — ED Notes (Signed)
Left hip replacement 1 month ago, pt recently began having left knee/calf pain. Swelling noted.

## 2018-03-15 NOTE — Therapy (Signed)
[facility] MAIN [facility] SERVICES [address] [place], [place], [address] Phone: [phone]   Fax:  [phone]  Physical Therapy Treatment  Patient Details  Name: Harold Sandoval MRN: [id number] Date of Birth: Oct 19, 1941 Referring Provider: Gean Birchwood   Encounter Date: 03/15/2018  PT End of Session - 03/15/18 1603    Visit Number  --    Number of Visits  --    Date for PT Re-Evaluation  --    PT Start Time  --    Equipment Utilized During Treatment  --    Activity Tolerance  --    Behavior During Therapy  --       Past Medical History:  Diagnosis Date  . Arthritis    "all over" (02/10/2018)  . Balance problem    when first stands.  Since stroke.  . Glaucoma, both eyes   . High cholesterol   . History of gout   . 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 Macular degeneration, left eye   . Sinus headache   . Stroke (HCC) 11/24/2015   denies residual on 02/10/2018  . Type II diabetes mellitus (HCC)     Past Surgical History:  Procedure Laterality Date  . APPENDECTOMY    . BACK SURGERY    . CATARACT EXTRACTION, BILATERAL    . COLONOSCOPY WITH PROPOFOL N/A 12/29/2016   Procedure: COLONOSCOPY WITH PROPOFOL;  Surgeon: Midge Minium, MD;  Location: [facility];  Service: Endoscopy;  Laterality: N/A;  Diabetic - oral meds  . JOINT REPLACEMENT    . LAPAROSCOPIC CHOLECYSTECTOMY    . LOOP RECORDER INSERTION N/A 02/24/2017   Procedure: Loop Recorder Insertion;  Surgeon: Marcina Millard, MD;  Location: [facility] INVASIVE CV LAB;  Service: Cardiovascular;  Laterality: N/A;  . LUMBAR DISC SURGERY    . 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 TOTAL HIP ARTHROPLASTY Left 02/10/2018   Procedure: TOTAL HIP ARTHROPLASTY ANTERIOR APPROACH;  Surgeon: Gean Birchwood, MD;  [facility]: [facility] OR;  Service: Orthopedics;  Laterality: Left;    There were no vitals filed for this visit.  Subjective Assessment - 03/15/18 1609    Subjective  Pt reports 8/10  pain in his L knee and says this pain has been on and off for several years.  He is planning to make an appointment with his MD tomorrow morning about this.  Pt reports that he has overwalked over the past week and this may be why it is swollen and hurting.  Pain increased in intensity last Tuesday.     Pertinent History  Patinet had several cortisone shots to left hip and in left knee. His surgery was 02/10/18 at [place] and was in the hospital and then he went home 02/12/18.  He was using  a RW and had HHPT 2 x / week. He now is using a SPT.     Limitations  Walking;Standing    How long can you sit comfortably?  as long as he wants to    How long can you stand comfortably?   5 mins     How long can you walk comfortably?  5 mins    Patient Stated Goals  Patient wants to be able to walk without the can and mow his lawn and weed it.     Currently in Pain?  Yes    Pain Score  8     Pain Location  Knee    Pain Orientation  Left  Pain Descriptors / Indicators  Aching    Pain Type  Chronic pain    Pain Onset  More than a month ago         Vitals taken at start of session resting in sitting: BP 123/77, SpO2 98, pulse 99-110, Temp 98.7          PT Education - 03/15/18 1603    Education provided  Yes    Education Details  Reasoning behing taking pt to the ED to r/o DVT L knee    Person(s) Educated  Patient    Methods  Explanation    Comprehension  Verbalized understanding       PT Short Term Goals - 03/09/18 1101      PT SHORT TERM GOAL #1   Title  Patient will increase BLE gross strength to 4+/5 as to improve functional strength for independent gait, increased standing tolerance and increased ADL ability.    Time  6    Period  Weeks    Status  New    Target Date  04/20/18      PT SHORT TERM GOAL #2   Title  Patient will be able to perform household work/ chores without increase in symptoms.    Time  6    Period  Weeks    Status  New    Target Date  04/20/18        PT  Long Term Goals - 03/09/18 0953      PT LONG TERM GOAL #1   Title  Patient will be independent in home exercise program to improve strength/mobility for better functional independence with ADLs.    Time  12    Status  New    Target Date  06/01/18      PT LONG TERM GOAL #2   Title  Patient will increase six minute walk test distance to >1000 for progression to community ambulator and improve gait ability    Time  12    Period  Weeks    Status  New    Target Date  06/01/18      PT LONG TERM GOAL #3   Title  Patient will increase 10 meter walk test to >1.1347m/s as to improve gait speed for better community ambulation and to reduce fall risk.    Time  12    Period  Weeks    Status  New    Target Date  06/01/18      PT LONG TERM GOAL #4   Title  Patient will reduce timed up and go to <11 seconds to reduce fall risk and demonstrate improved transfer/gait ability.    Time  12    Period  Weeks    Status  New    Target Date  06/01/18      PT LONG TERM GOAL #5   Title  Patient will report a worst pain of 3/10 on VAS in  left hip           to improve tolerance with ADLs and reduced symptoms with activities.     Time  12    Period  Weeks    Status  New    Target Date  06/01/18            Plan - 03/15/18 1610    Clinical Impression Statement   Pt reports he is currently having L knee pain that intensified beginning last Tuesday (4/2). Pt reports he believes this pain is due to  his increased amount of walking over the past week. Assessment completed of L knee with swelling noted medial L knee and very TTP. No redness or warmth appreciated compared to RLE. Pt reports several year h/o L knee pain with occasional flare ups with swelling in same area in L knee. Pt denies SOB and no redness or warmth noted compared to RLE. Of significance, pt has been putting heat on his L knee 2x/day because he reports he was told by a previous PT to put heat on his L knee when he is having pain. He has been  putting heat on his L knee since 4/2 (last Tuesday) which is the same day the pt began having an increase in the intensity in his L knee pain. This PT called Dr. Wadie Lessen office and receptionist told this PT that there was not a nurse of doctor that is familiar with the pt who could talk to this therapist but she scheduled an appointment with Dr. Wadie Lessen PA tomorrow morning at 10:30am. Pt notified of and in agreement with appointment. However, there is immediate concern to r/o DVT L knee and this PT transported the pt to the ED where the receptionist was provided all information, including vitals (resting pulse up to 110). Pt appreciative and understanding.     Rehab Potential  Good    PT Frequency  2x / week    PT Duration  12 weeks    PT Treatment/Interventions  Cryotherapy;Electrical Stimulation;Moist Heat;Gait training;Ultrasound;Stair training;Functional mobility training;Therapeutic exercise;Therapeutic activities;Balance training;Neuromuscular re-education;Patient/family education;Manual techniques;Passive range of motion;Dry needling    PT Next Visit Plan  heat, e-stim, [place], manual therpay, therapeutic exercise    PT Home Exercise Plan  heat, heel raises standing , hip abd    Consulted and Agree with Plan of Care  Patient       Patient will benefit from skilled therapeutic intervention in order to improve the following deficits and impairments:  Abnormal gait, Decreased endurance, Decreased mobility, Difficulty walking, Pain, Impaired flexibility, Decreased strength, Decreased safety awareness, Decreased activity tolerance  Visit Diagnosis: Difficulty in walking, not elsewhere classified  Muscle weakness (generalized)     Problem List Patient Active Problem List   Diagnosis Date Noted  . 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 Osteoarthritis of left hip 02/09/2018  . Constipation   . Change in bowel habits   . Chest pain 05/12/2016  . Strain of rectus abdominis muscle  08/01/2015    Encarnacion Chu PT, DPT 03/15/2018, 4:54 PM  [facility] MAIN [facility] SERVICES [address] [place], [place], [address] Phone: [phone]   Fax:  [phone]  Name: MACKENZY EISENBERG MRN: [id number] Date of Birth: 14-Dec-1940

## 2018-03-15 NOTE — Discharge Instructions (Addendum)
Return to the ER for new, worsening, or persistent severe pain, swelling, weakness or numbness, or any other new or worsening symptoms that concern you.  If you need to, you can take the pain pills that you were given after the surgery for the acute pain you are feeling now.  When at home, keep the leg elevated, and you may use ice on the knee to help decrease the swelling and pain.  You should follow-up with your regular doctor and your orthopedist, and go with the advice of the physical therapist in terms of how much activity to do going forward.

## 2018-03-17 ENCOUNTER — Ambulatory Visit: Payer: Medicare HMO | Admitting: Physical Therapy

## 2018-03-22 ENCOUNTER — Ambulatory Visit: Payer: Medicare HMO | Admitting: Physical Therapy

## 2018-03-22 ENCOUNTER — Encounter: Payer: Self-pay | Admitting: Physical Therapy

## 2018-03-22 DIAGNOSIS — R262 Difficulty in walking, not elsewhere classified: Secondary | ICD-10-CM | POA: Diagnosis not present

## 2018-03-22 DIAGNOSIS — M6281 Muscle weakness (generalized): Secondary | ICD-10-CM

## 2018-03-22 NOTE — Therapy (Signed)
[place] [facility] [address] [place], [place], [address] Phone: [phone]   Fax:  [phone]  Physical Therapy Treatment  Patient Details  Name: Harold Sandoval MRN: [id number] Date of Birth: 01/03/1941 Referring Provider: Gean BirchwoodOWAN, FRANK   Encounter Date: 03/22/2018  PT End of Session - 03/22/18 1527    Visit Number  3    Number of Visits  25    Date for PT Re-Evaluation  06/01/18    PT Start Time  1515    PT Stop Time  1600    PT Time Calculation (min)  45 min    Equipment Utilized During Treatment  Gait belt    Activity Tolerance  Patient tolerated treatment well;Patient limited by pain    Behavior During Therapy  [facility] for tasks assessed/performed       Past Medical History:  Diagnosis Date  . Arthritis    "all over" (02/10/2018)  . Balance problem    when first stands.  Since stroke.  . Glaucoma, both eyes   . High cholesterol   . History of gout   . 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 Macular degeneration, left eye   . Sinus headache   . Stroke (HCC) 11/24/2015   denies residual on 02/10/2018  . Type II diabetes mellitus (HCC)     Past Surgical History:  Procedure Laterality Date  . APPENDECTOMY    . BACK SURGERY    . CATARACT EXTRACTION, BILATERAL    . COLONOSCOPY WITH PROPOFOL N/A 12/29/2016   Procedure: COLONOSCOPY WITH PROPOFOL;  Surgeon: Midge Miniumarren Wohl, MD;  [facility];  Service: Endoscopy;  Laterality: N/A;  Diabetic - oral meds  . JOINT REPLACEMENT    . LAPAROSCOPIC CHOLECYSTECTOMY    . LOOP RECORDER INSERTION N/A 02/24/2017   Procedure: Loop Recorder Insertion;  Surgeon: Marcina MillardAlexander Paraschos, MD;  Location: [facility] INVASIVE CV LAB;  Service: Cardiovascular;  Laterality: N/A;  . LUMBAR DISC SURGERY    . 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  Subjective Assessment - 03/22/18 1525    Subjective  Patient went to the ER on april 8th and the encounter reports negative US and most likely the pain is arthritis pain or swelling caousing the pain.     Pertinent History  Patinet had several cortisone shots to left hip and in left knee. His surgery was 02/10/18 at [place] and was in the hospital and then he went home 02/12/18.  He was using  a RW and had HHPT 2 x / week. He now is using a SPT.     Limitations  Walking;Standing    How long can you sit comfortably?  as long as he wants to    How long can you stand comfortably?   5 mins     How long can you walk comfortably?  5 mins    Currently in Pain?  Yes    Pain Score  2     Pain Location  Knee    Pain Orientation  Left    Pain Descriptors / Indicators  Aching    Pain Type  Acute pain    Pain Radiating Towards  left knee    Pain Frequency  Constant  Aggravating Factors   walking    Pain Relieving Factors  pain medication    Effect of Pain on Daily Activities  difficulty walking    Multiple Pain Sites  No      Treatment: Nu-step x 5 mins ; patient does not think he can do this machine but is willing to try and finds out that he is actually comfortable on it and can perform the exercise SAQ with 3 sec hold x 20 reps on LLE and RLE Glut sets with 3 sec hold x 20  Heel slides x 20  RLE hip flex / ext x 20  LAQ x 20 LLE and RLE  TM with UE assist . 5 miles / hour x 4 mins, then increased it to . 8 miles/ hour x 3 mins Bridges x 15  Pain increases to 7/10 following therapy; patient is not able to straighten out his LLE at all during PT session.  Patient had 5/10 knee pain prior to treatment, then the knee  pain decreased to 2/10 with the warm up, then the knee pain increased to 7/10 . His pain is in the knee except when he tries to straighten his leg into extension and then the pain is in the  hip also.  Ice to left groin x 10 mins following therapy.                           PT Education - 03/22/18 1527    Education provided  Yes    Education Details  HEP review    Person(s) Educated  Patient    Methods  Explanation    Comprehension  Verbalized understanding;Returned demonstration       PT Short Term Goals - 03/09/18 1101      PT SHORT TERM GOAL #1   Title  Patient will increase BLE gross strength to 4+/5 as to improve functional strength for independent gait, increased standing tolerance and increased ADL ability.    Time  6    Period  Weeks    Status  New    Target Date  04/20/18      PT SHORT TERM GOAL #2   Title  Patient will be able to perform household work/ chores without increase in symptoms.    Time  6    Period  Weeks    Status  New    Target Date  04/20/18        PT Long Term Goals - 03/09/18 0953      PT LONG TERM GOAL #1   Title  Patient will be independent in home exercise program to improve strength/mobility for better functional independence with ADLs.    Time  12    Status  New    Target Date  06/01/18      PT LONG TERM GOAL #2   Title  Patient will increase six minute walk test distance to >1000 for progression to community ambulator and improve gait ability    Time  12    Period  Weeks    Status  New    Target Date  06/01/18      PT LONG TERM GOAL #3   Title  Patient will increase 10 meter walk test to >1.47m/s as to improve gait speed for better community ambulation and to reduce fall risk.    Time  12    Period  Weeks    Status  New  Target Date  06/01/18      PT LONG TERM GOAL #4   Title  Patient will reduce timed up and go to <11 seconds to reduce fall risk and demonstrate improved transfer/gait ability.    Time  12    Period  Weeks    Status  New    Target Date  06/01/18      PT LONG TERM GOAL #5   Title  Patient will report a worst pain of 3/10 on VAS in  left hip           to improve tolerance  with ADLs and reduced symptoms with activities.     Time  12    Period  Weeks    Status  New    Target Date  06/01/18            Plan - 03/22/18 1528    Clinical Impression Statement  Patient reports not taking any pain medication and his pain is 2/10.  The pain changes from 2/10-8/10 and it is worse with activity. He has difficulty with getting his left leg into extenison in lying down. He has slow antalgic gait with spc. Patient is able to perform basic strengthening exercises with anterior hip precauttions : SAQ, LAQ, glut sets, ankle pumps , heel slides. Patient performs gait on TM with slow gait speed and has fatigue and discomfort.    Rehab Potential  Good    PT Frequency  2x / week    PT Duration  12 weeks    PT Treatment/Interventions  Cryotherapy;Electrical Stimulation;Moist Heat;Gait training;Ultrasound;Stair training;Functional mobility training;Therapeutic exercise;Therapeutic activities;Balance training;Neuromuscular re-education;Patient/family education;Manual techniques;Passive range of motion;Dry needling    PT Next Visit Plan  heat, e-stim, [place], manual therpay, therapeutic exercise    PT Home Exercise Plan  heat, heel raises standing , hip abd    Consulted and Agree with Plan of Care  Patient       Patient will benefit from skilled therapeutic intervention in order to improve the following deficits and impairments:  Abnormal gait, Decreased endurance, Decreased mobility, Difficulty walking, Pain, Impaired flexibility, Decreased strength, Decreased safety awareness, Decreased activity tolerance  Visit Diagnosis: Difficulty in walking, not elsewhere classified  Muscle weakness (generalized)     Problem List Patient Active Problem List   Diagnosis Date Noted  . 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 Osteoarthritis of left hip 02/09/2018  . Constipation   . Change in bowel habits   . Chest pain 05/12/2016  . Strain of rectus abdominis muscle 08/01/2015     Ezekiel Ina, [place] DPT 03/22/2018, 3:35 PM  [place] [facility] MAIN [facility] SERVICES [address] [place], [place], [address] Phone: [phone]   Fax:  [phone]  Name: Harold Sandoval MRN: [id number] Date of Birth: 1941-01-02

## 2018-03-24 ENCOUNTER — Encounter: Payer: Self-pay | Admitting: Physical Therapy

## 2018-03-24 ENCOUNTER — Ambulatory Visit: Payer: Medicare HMO | Admitting: Physical Therapy

## 2018-03-24 DIAGNOSIS — R262 Difficulty in walking, not elsewhere classified: Secondary | ICD-10-CM | POA: Diagnosis not present

## 2018-03-24 DIAGNOSIS — M6281 Muscle weakness (generalized): Secondary | ICD-10-CM

## 2018-03-24 NOTE — Therapy (Signed)
[place] [facility] [address] [place], [place], [address] Phone: [phone]   Fax:  [phone]  Physical Therapy Treatment  Patient Details  Name: Harold Sandoval MRN: [id number] Date of Birth: 07-18-1941 Referring Provider: Gean BirchwoodOWAN, FRANK   Encounter Date: 03/24/2018  PT End of Session - 03/24/18 1402    Visit Number  4    Number of Visits  25    Date for PT Re-Evaluation  06/01/18    PT Start Time  1345    PT Stop Time  1430    PT Time Calculation (min)  45 min    Equipment Utilized During Treatment  Gait belt    Activity Tolerance  Patient tolerated treatment well;Patient limited by pain    Behavior During Therapy  [facility] for tasks assessed/performed       Past Medical History:  Diagnosis Date  . Arthritis    "all over" (02/10/2018)  . Balance problem    when first stands.  Since stroke.  . Glaucoma, both eyes   . High cholesterol   . History of gout   . 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 Macular degeneration, left eye   . Sinus headache   . Stroke (HCC) 11/24/2015   denies residual on 02/10/2018  . Type II diabetes mellitus (HCC)     Past Surgical History:  Procedure Laterality Date  . APPENDECTOMY    . BACK SURGERY    . CATARACT EXTRACTION, BILATERAL    . COLONOSCOPY WITH PROPOFOL N/A 12/29/2016   Procedure: COLONOSCOPY WITH PROPOFOL;  Surgeon: Midge Miniumarren Wohl, MD;  [facility];  Service: Endoscopy;  Laterality: N/A;  Diabetic - oral meds  . JOINT REPLACEMENT    . LAPAROSCOPIC CHOLECYSTECTOMY    . LOOP RECORDER INSERTION N/A 02/24/2017   Procedure: Loop Recorder Insertion;  Surgeon: Marcina MillardAlexander Paraschos, MD;  Location: [facility] INVASIVE CV LAB;  Service: Cardiovascular;  Laterality: N/A;  . LUMBAR DISC SURGERY    . 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  Subjective Assessment - 03/24/18 1359    Subjective  Patient Is is having intermittent pain that is ranging from 0/10 - 8/10.     Pertinent History  Patinet had several cortisone shots to left hip and in left knee. His surgery was 02/10/18 at [place] and was in the hospital and then he went home 02/12/18.  He was using  a RW and had HHPT 2 x / week. He now is using a SPT.     Limitations  Walking;Standing    How long can you sit comfortably?  as long as he wants to    How long can you stand comfortably?   5 mins     How long can you walk comfortably?  5 mins    Patient Stated Goals  Patient wants to be able to walk without the can and mow his lawn and weed it.     Currently in Pain?  Yes    Pain Score  2     Pain Location  Shoulder    Pain Orientation  Left    Pain Descriptors / Indicators  Aching    Pain Radiating Towards  left knee    Pain Onset  More than a month ago    Pain Frequency  Constant    Aggravating Factors   walking    Pain Relieving Factors  pain medication    Effect of Pain on Daily Activities  difficutly    Multiple Pain Sites  No       Treatment: Nu-step x 5 mins ; patient does not think he can do this machine but is willing to try and finds out that he is actually comfortable on it and can perform the exercise Leg press 100 lbs x 20 x 2, heel raises x 20 x 2  SAQ with 3 sec hold x 20 reps on LLE and RLE Glut sets with 3 sec hold x 20  Heel slides x 20  RLE hip flex / ext x 20  LAQ x 20 LLE and RLE  TM with UE assist . 1.4 miles / hour x 10 mins Bridges x 15   Patient needs vc for correct form and technique of exercises Ice to left groin x 10 mins following therapy.                          PT Education - 03/24/18 1402    Education provided  Yes    Education Details  HEP    Person(s) Educated  Patient    Methods  Explanation;Demonstration     Comprehension  Verbalized understanding;Returned demonstration       PT Short Term Goals - 03/09/18 1101      PT SHORT TERM GOAL #1   Title  Patient will increase BLE gross strength to 4+/5 as to improve functional strength for independent gait, increased standing tolerance and increased ADL ability.    Time  6    Period  Weeks    Status  New    Target Date  04/20/18      PT SHORT TERM GOAL #2   Title  Patient will be able to perform household work/ chores without increase in symptoms.    Time  6    Period  Weeks    Status  New    Target Date  04/20/18        PT Long Term Goals - 03/09/18 0953      PT LONG TERM GOAL #1   Title  Patient will be independent in home exercise program to improve strength/mobility for better functional independence with ADLs.    Time  12    Status  New    Target Date  06/01/18      PT LONG TERM GOAL #2   Title  Patient will increase six minute walk test distance to >1000 for progression to community ambulator and improve gait ability    Time  12    Period  Weeks    Status  New    Target Date  06/01/18      PT LONG TERM GOAL #3   Title  Patient will increase 10 meter walk test to >1.52m/s as to improve gait speed for better community ambulation and to reduce fall risk.    Time  12    Period  Weeks    Status  New    Target Date  06/01/18      PT LONG TERM GOAL #4   Title  Patient will reduce timed up and go to <11 seconds to reduce fall risk and demonstrate improved transfer/gait ability.    Time  12  Period  Weeks    Status  New    Target Date  06/01/18      PT LONG TERM GOAL #5   Title  Patient will report a worst pain of 3/10 on VAS in  left hip           to improve tolerance with ADLs and reduced symptoms with activities.     Time  12    Period  Weeks    Status  New    Target Date  06/01/18            Plan - 03/24/18 1403    Clinical Impression Statement  Pt presents with decreased ambulation distances and pain in left  groin with supine leg extension,  and fatigues with therapeutic exercises. Patient needs assist with exercise technique. Patient demonstrates difficulty with getting his left leg straight lacks left hip extension. Patient reports no hip pain except with leg extension in supine.  Patient tolerated all interventions well this date except for being able to get his leg straight, and will benefit from continued skilled PT interventions to improve strength and balance and decrease risk of falling    Rehab Potential  Good    PT Frequency  2x / week    PT Duration  12 weeks    PT Treatment/Interventions  Cryotherapy;Electrical Stimulation;Moist Heat;Gait training;Ultrasound;Stair training;Functional mobility training;Therapeutic exercise;Therapeutic activities;Balance training;Neuromuscular re-education;Patient/family education;Manual techniques;Passive range of motion;Dry needling    PT Next Visit Plan  heat, e-stim, [place], manual therpay, therapeutic exercise    PT Home Exercise Plan  heat, heel raises standing , hip abd    Consulted and Agree with Plan of Care  Patient       Patient will benefit from skilled therapeutic intervention in order to improve the following deficits and impairments:  Abnormal gait, Decreased endurance, Decreased mobility, Difficulty walking, Pain, Impaired flexibility, Decreased strength, Decreased safety awareness, Decreased activity tolerance  Visit Diagnosis: Difficulty in walking, not elsewhere classified  Muscle weakness (generalized)     Problem List Patient Active Problem List   Diagnosis Date Noted  . 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 Osteoarthritis of left hip 02/09/2018  . Constipation   . Change in bowel habits   . Chest pain 05/12/2016  . Strain of rectus abdominis muscle 08/01/2015    Ezekiel Ina , [place] DPT 03/24/2018, 2:04 PM  [place] MAIN [facility] SERVICES [address] [place],  [place], [address] Phone: [phone]   Fax:  [phone]  Name: Harold Sandoval MRN: [id number] Date of Birth: 01/09/1941

## 2018-03-29 ENCOUNTER — Ambulatory Visit: Payer: Medicare HMO | Admitting: Physical Therapy

## 2018-03-31 ENCOUNTER — Ambulatory Visit: Payer: Medicare HMO

## 2018-03-31 VITALS — BP 110/65 | HR 97

## 2018-03-31 DIAGNOSIS — R262 Difficulty in walking, not elsewhere classified: Secondary | ICD-10-CM

## 2018-03-31 DIAGNOSIS — M6281 Muscle weakness (generalized): Secondary | ICD-10-CM

## 2018-03-31 NOTE — Therapy (Signed)
[place] [facility] MAIN [facility] SERVICES [address] [place], [place], [address] Phone: [phone]   Fax:  [phone]  Physical Therapy Treatment  Patient Details  Name: Harold Sandoval MRN: [id number] Date of Birth: 1941/12/02 Referring Provider: Gean Birchwood   Encounter Date: 03/31/2018  PT End of Session - 03/31/18 1700    Visit Number  5    Number of Visits  25    Date for PT Re-Evaluation  06/01/18    PT Start Time  1648    PT Stop Time  1720    PT Time Calculation (min)  32 min    Equipment Utilized During Treatment  Gait belt    Activity Tolerance  Patient tolerated treatment well;Patient limited by pain    Behavior During Therapy  [facility] for tasks assessed/performed       Past Medical History:  Diagnosis Date  . Arthritis    "all over" (02/10/2018)  . Balance problem    when first stands.  Since stroke.  . Glaucoma, both eyes   . High cholesterol   . History of gout   . 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 Macular degeneration, left eye   . Sinus headache   . Stroke (HCC) 11/24/2015   denies residual on 02/10/2018  . Type II diabetes mellitus (HCC)     Past Surgical History:  Procedure Laterality Date  . APPENDECTOMY    . BACK SURGERY    . CATARACT EXTRACTION, BILATERAL    . COLONOSCOPY WITH PROPOFOL N/A 12/29/2016   Procedure: COLONOSCOPY WITH PROPOFOL;  Surgeon: Midge Minium, MD;  Location: [facility];  Service: Endoscopy;  Laterality: N/A;  Diabetic - oral meds  . JOINT REPLACEMENT    . LAPAROSCOPIC CHOLECYSTECTOMY    . LOOP RECORDER INSERTION N/A 02/24/2017   Procedure: Loop Recorder Insertion;  Surgeon: Marcina Millard, MD;  Location: [facility] INVASIVE CV LAB;  Service: Cardiovascular;  Laterality: N/A;  . LUMBAR DISC SURGERY    . 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 He currently rates his pain as a 4/10. Pt called his MD just prior to coming due to persistent soreness. No specific questions currently.     Pertinent History  Patinet had several cortisone shots to left hip and in left knee. His surgery was 02/10/18 at [place] and was in the hospital and then he went home 02/12/18.  He was using  a RW and had HHPT 2 x / week. He now is using a SPT.     Limitations  Walking;Standing    How long can you sit comfortably?  as long as he wants to    How long can you stand comfortably?   5 mins     How long can you walk comfortably?  5 mins    Patient Stated Goals  Patient wants to be able to walk without the can and mow his lawn and weed it.     Currently in Pain?  Yes    Pain Score  5     Pain Location  Hip    Pain Orientation  Left    Pain Descriptors / Indicators  Aching    Pain Type  Chronic pain    Pain Radiating Towards  left thigh    Pain Onset  More than a month ago          TREATMENT  Ther-ex  Quad sets with L knee over bolster, pt unable to get leg flat on table due to "pulling" in anterior L hip 3s hold x 10; L SAQ over bolster with 3 sec hold x 15 reps; Heel slides x 10, pt reports increase in L anterior hip pain; Bridges 2 x 15;  Isometric clams in hooklying 3s hold 2 x 15; Isometric adduction in hooklying 3s hold 2 x 15;  Manual Therapy  Gentle long axis distraction with belt assist, added heat to L anterior hip/hip flexors to minimize tone and decrease pain. Pt gradually progressed into more and more extension by raising the table height during distraction;  Patient needs vc for correct form and technique of exercises;                      PT Education - 03/31/18 1659    Education  provided  Yes    Education Details  exercise form/technique, gentle stretching of L hip flexors    Person(s) Educated  Patient    Methods  Explanation    Comprehension  Verbalized understanding       PT Short Term Goals - 03/09/18 1101      PT SHORT TERM GOAL #1   Title  Patient will increase BLE gross strength to 4+/5 as to improve functional strength for independent gait, increased standing tolerance and increased ADL ability.    Time  6    Period  Weeks    Status  New    Target Date  04/20/18      PT SHORT TERM GOAL #2   Title  Patient will be able to perform household work/ chores without increase in symptoms.    Time  6    Period  Weeks    Status  New    Target Date  04/20/18        PT Long Term Goals - 03/09/18 0953      PT LONG TERM GOAL #1   Title  Patient will be independent in home exercise program to improve strength/mobility for better functional independence with ADLs.    Time  12    Status  New    Target Date  06/01/18      PT LONG TERM GOAL #2   Title  Patient will increase six minute walk test distance to >1000 for progression to community ambulator and improve gait ability    Time  12    Period  Weeks    Status  New    Target Date  06/01/18      PT LONG TERM GOAL #3   Title  Patient will increase 10 meter walk test to >1.2253m/s as to improve gait speed for better community ambulation and to reduce fall risk.    Time  12    Period  Weeks    Status  New    Target Date  06/01/18      PT LONG TERM GOAL #4   Title  Patient will reduce timed up and go to <11 seconds to reduce fall risk and demonstrate improved transfer/gait ability.    Time  12  Period  Weeks    Status  New    Target Date  06/01/18      PT LONG TERM GOAL #5   Title  Patient will report a worst pain of 3/10 on VAS in  left hip           to improve tolerance with ADLs and reduced symptoms with activities.     Time  12    Period  Weeks    Status  New    Target Date  06/01/18             Plan - 03/31/18 1709    Clinical Impression Statement  Pt reports increase in L hip pain upon arrival today. He is unable to extend his hip to the table and at best is able to get to approximately 15 degrees of hip flexion. Pt reports no pain with gentle long axis distraction with heat on R hip to help decrease tone in hip flexors. Pt demonstrates good motivation with low level LLE strengthening. Pt encouraged to continue his HEP and follow-up as scheduled.     Rehab Potential  Good    PT Frequency  2x / week    PT Duration  12 weeks    PT Treatment/Interventions  Cryotherapy;Electrical Stimulation;Moist Heat;Gait training;Ultrasound;Stair training;Functional mobility training;Therapeutic exercise;Therapeutic activities;Balance training;Neuromuscular re-education;Patient/family education;Manual techniques;Passive range of motion;Dry needling    PT Next Visit Plan  heat, e-stim, [place], manual therapy, therapeutic exercise    PT Home Exercise Plan  heat, heel raises standing , hip abd    Consulted and Agree with Plan of Care  Patient       Patient will benefit from skilled therapeutic intervention in order to improve the following deficits and impairments:  Abnormal gait, Decreased endurance, Decreased mobility, Difficulty walking, Pain, Impaired flexibility, Decreased strength, Decreased safety awareness, Decreased activity tolerance  Visit Diagnosis: Difficulty in walking, not elsewhere classified  Muscle weakness (generalized)     Problem List Patient Active Problem List   Diagnosis Date Noted  . 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 Osteoarthritis of left hip 02/09/2018  . Constipation   . Change in bowel habits   . Chest pain 05/12/2016  . Strain of rectus abdominis muscle 08/01/2015   Lynnea Maizes PT, DPT   Lakenzie Mcclafferty 03/31/2018, 11:01 PM  [place] [facility] MAIN [facility] SERVICES [address] [place], [place],  [address] Phone: [phone]   Fax:  [phone]  Name: Harold Sandoval MRN: [id number] Date of Birth: Mar 08, 1941

## 2018-04-06 ENCOUNTER — Ambulatory Visit: Payer: Medicare HMO

## 2018-04-06 VITALS — BP 139/74 | HR 98

## 2018-04-06 DIAGNOSIS — R262 Difficulty in walking, not elsewhere classified: Secondary | ICD-10-CM

## 2018-04-06 DIAGNOSIS — M6281 Muscle weakness (generalized): Secondary | ICD-10-CM

## 2018-04-06 NOTE — Therapy (Signed)
[place] MAIN [facility] SERVICES [address] [place], [place], [address] Phone: [phone]   Fax:  [phone]  Physical Therapy Treatment  Patient Details  Name: Harold Sandoval MRN: [id number] Date of Birth: [birth year] Referring Provider: Frederik Pear   Encounter Date: 04/06/2018  PT End of Session - 04/06/18 1614    Visit Number  6    Number of Visits  25    Date for PT Re-Evaluation  06/01/18    PT Start Time  1602    PT Stop Time  1645    PT Time Calculation (min)  43 min    Equipment Utilized During Treatment  Gait belt    Activity Tolerance  Patient tolerated treatment well;Patient limited by pain    Behavior During Therapy  [facility] for tasks assessed/performed       Past Medical History:  Diagnosis Date  . Arthritis    "all over" (02/10/2018)  . Balance problem    when first stands.  Since stroke.  . Glaucoma, both eyes   . High cholesterol   . History of gout   . 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 Macular degeneration, left eye   . Sinus headache   . Stroke ([place]) 11/24/2015   denies residual on 02/10/2018  . Type II diabetes mellitus ([place])     Past Surgical History:  Procedure Laterality Date  . APPENDECTOMY    . BACK SURGERY    . CATARACT EXTRACTION, BILATERAL    . COLONOSCOPY WITH PROPOFOL N/A 12/29/2016   Procedure: COLONOSCOPY WITH PROPOFOL;  Surgeon: Lucilla Lame, MD;  Location: [place];  Service: Endoscopy;  Laterality: N/A;  Diabetic - oral meds  . JOINT REPLACEMENT    . LAPAROSCOPIC CHOLECYSTECTOMY    . LOOP RECORDER INSERTION N/A 02/24/2017   Procedure: Loop Recorder Insertion;  Surgeon: Isaias Cowman, MD;  Location: [place] CV LAB;  Service: Cardiovascular;  Laterality: N/A;  . LUMBAR [place]    . 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 He currently rates his pain as a 5/10. No specific questions currently.     Pertinent History  Patinet had several cortisone shots to left hip and in left knee. His surgery was 02/10/18 at [place] and was in the hospital and then he went home 02/12/18.  He was using  a RW and had HHPT 2 x / week. He now is using a SPT.     Limitations  Walking;Standing    How long can you sit comfortably?  as long as he wants to    How long can you stand comfortably?   5 mins     How long can you walk comfortably?  5 mins    Patient Stated Goals  Patient wants to be able to walk without the can and mow his lawn and weed it.     Currently in Pain?  Yes    Pain Score  5     Pain Location  Hip    Pain Orientation  Left    Pain  Descriptors / Indicators  Aching    Pain Type  Chronic pain    Pain Radiating Towards  left thigh    Pain Onset  More than a month ago    Pain Frequency  Constant            TREATMENT  Ther-ex  Hookying bridge 2 x 10; Sit to stand without UE support x 12; Pt educated about adding prone laying to his HEP, demonstration provided about how to make it easier to start; Cold pack applied to left hip during HEP review and demonstration;  Manual Therapy  Gentle long axis distraction with belt assist, added heat to L anterior hip/hip flexors to minimize tone and decrease pain. Pt gradually progressed into more and more extension by raising the table height during distraction; Medial to lateral mobs with belt, grade II-III, 30s/bout x 3 bouts Inferior at 90 mobs with belt, grade II, 30s/bouts x 3 bouts Manual psoas release x 2 minutes; MET hip extension stretch 5s hold, 5s stretch x 3; Patient needs vc for correct form and technique of  exercises;                      PT Education - 04/06/18 1614    Education provided  Yes    Education Details  exercise form/technique, prone positioning at home    Person(s) Educated  Patient    Methods  Explanation    Comprehension  Verbalized understanding       PT Short Term Goals - 03/09/18 1101      PT SHORT TERM GOAL #1   Title  Patient will increase BLE gross strength to 4+/5 as to improve functional strength for independent gait, increased standing tolerance and increased ADL ability.    Time  6    Period  Weeks    Status  New    Target Date  04/20/18      PT SHORT TERM GOAL #2   Title  Patient will be able to perform household work/ chores without increase in symptoms.    Time  6    Period  Weeks    Status  New    Target Date  04/20/18        PT Long Term Goals - 03/09/18 0953      PT LONG TERM GOAL #1   Title  Patient will be independent in home exercise program to improve strength/mobility for better functional independence with ADLs.    Time  12    Status  New    Target Date  06/01/18      PT LONG TERM GOAL #2   Title  Patient will increase six minute walk test distance to >1000 for progression to community ambulator and improve gait ability    Time  12    Period  Weeks    Status  New    Target Date  06/01/18      PT LONG TERM GOAL #3   Title  Patient will increase 10 meter walk test to >1.55ms as to improve gait speed for better community ambulation and to reduce fall risk.    Time  12    Period  Weeks    Status  New    Target Date  06/01/18      PT LONG TERM GOAL #4   Title  Patient will reduce timed up and go to <11 seconds to reduce fall risk and demonstrate improved transfer/gait ability.  Time  12    Period  Weeks    Status  New    Target Date  06/01/18      PT LONG TERM GOAL #5   Title  Patient will report a worst pain of 3/10 on VAS in  left hip           to improve tolerance with ADLs and reduced symptoms with  activities.     Time  12    Period  Weeks    Status  New    Target Date  06/01/18            Plan - 04/06/18 1615    Clinical Impression Statement  Pt reports typical L hip pain upon arrival today. He is unable to extend his hip to the table and continues to ambulate with forward flexed posture. Pt reports no pain with gentle long axis distraction with heat on R hip to help decrease tone in hip flexors. He demonstrates significant improvement in L hip extension ROM during session. Pt demonstrates good motivation with low level LLE strengthening. Pt encouraged to continue his HEP and follow-up as scheduled. Encouraged prone positioning to stretch hip flexors.     Rehab Potential  Good    PT Frequency  2x / week    PT Duration  12 weeks    PT Treatment/Interventions  Cryotherapy;Electrical Stimulation;Moist Heat;Gait training;Ultrasound;Stair training;Functional mobility training;Therapeutic exercise;Therapeutic activities;Balance training;Neuromuscular re-education;Patient/family education;Manual techniques;Passive range of motion;Dry needling    PT Next Visit Plan  heat, e-stim, [place], manual therapy, therapeutic exercise    PT Home Exercise Plan  heat, heel raises standing , hip abd, prone positioning    Consulted and Agree with Plan of Care  Patient       Patient will benefit from skilled therapeutic intervention in order to improve the following deficits and impairments:  Abnormal gait, Decreased endurance, Decreased mobility, Difficulty walking, Pain, Impaired flexibility, Decreased strength, Decreased safety awareness, Decreased activity tolerance  Visit Diagnosis: Difficulty in walking, not elsewhere classified  Muscle weakness (generalized)     Problem List Patient Active Problem List   Diagnosis Date Noted  . 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 Osteoarthritis of left hip 02/09/2018  . Constipation   . Change in bowel habits   . Chest pain 05/12/2016  .  Strain of rectus abdominis muscle 08/01/2015   Phillips Grout PT, DPT   Lucus Lambertson 04/06/2018, 10:03 PM  [place] MAIN [facility] SERVICES [address] [place], [place], [address] Phone: [phone]   Fax:  [phone]  Name: Harold Sandoval MRN: [id number] Date of Birth: March 22, 1941

## 2018-04-09 ENCOUNTER — Ambulatory Visit: Payer: Medicare HMO | Attending: Orthopedic Surgery

## 2018-04-09 DIAGNOSIS — R262 Difficulty in walking, not elsewhere classified: Secondary | ICD-10-CM | POA: Insufficient documentation

## 2018-04-09 DIAGNOSIS — M6281 Muscle weakness (generalized): Secondary | ICD-10-CM | POA: Diagnosis present

## 2018-04-09 DIAGNOSIS — M25652 Stiffness of left hip, not elsewhere classified: Secondary | ICD-10-CM | POA: Diagnosis present

## 2018-04-09 NOTE — Therapy (Signed)
[place] MAIN [facility] SERVICES [address] [place], [place], [address] Phone: [phone]   Fax:  [phone]  Physical Therapy Treatment  Patient Details  Name: Harold Sandoval MRN: [id number] Date of Birth: 09/20/1941 Referring Provider: Frederik Pear   Encounter Date: 04/09/2018  PT End of Session - 04/09/18 0900    Visit Number  7    Number of Visits  25    Date for PT Re-Evaluation  06/01/18    Authorization Type  progress note 7/10    PT Start Time  0843    PT Stop Time  0930    PT Time Calculation (min)  47 min    Equipment Utilized During Treatment  Gait belt    Activity Tolerance  Patient tolerated treatment well;Patient limited by pain    Behavior During Therapy  [facility] for tasks assessed/performed       Past Medical History:  Diagnosis Date  . Arthritis    "all over" (02/10/2018)  . Balance problem    when first stands.  Since stroke.  . Glaucoma, both eyes   . High cholesterol   . History of gout   . 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 Macular degeneration, left eye   . Sinus headache   . Stroke ([place]) 11/24/2015   denies residual on 02/10/2018  . Type II diabetes mellitus ([place])     Past Surgical History:  Procedure Laterality Date  . APPENDECTOMY    . BACK SURGERY    . CATARACT EXTRACTION, BILATERAL    . COLONOSCOPY WITH PROPOFOL N/A 12/29/2016   Procedure: COLONOSCOPY WITH PROPOFOL;  Surgeon: Lucilla Lame, MD;  Location: [place];  Service: Endoscopy;  Laterality: N/A;  Diabetic - oral meds  . JOINT REPLACEMENT    . LAPAROSCOPIC CHOLECYSTECTOMY    . LOOP RECORDER INSERTION N/A 02/24/2017   Procedure: Loop Recorder Insertion;  Surgeon: Isaias Cowman, MD;  Location: [place] [facility];  Service: Cardiovascular;  Laterality: N/A;  . LUMBAR [place]    . 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  Subjective Assessment - 04/09/18 0842    Subjective  Pt states that he continues to have increased L thigh soreness today. He had progressively increasing soreness the evening after the last therapy session and then it was very sore on Wednesday. He has been attempting to get onto his stomach to stretch. He currently rates his pain as a 5/10. No specific questions currently.     Pertinent History  Patinet had several cortisone shots to left hip and in left knee. His surgery was 02/10/18 at [place] and was in the hospital and then he went home 02/12/18.  He was using  a RW and had HHPT 2 x / week. He now is using a SPT.     Limitations  Walking;Standing    How long can you sit comfortably?  as long as he wants to    How long can you stand comfortably?   5 mins     How long can you walk comfortably?  5 mins    Patient Stated Goals  Patient wants to be able to walk without the can and mow his lawn and weed it.  Currently in Pain?  Yes    Pain Score  5     Pain Location  Hip    Pain Orientation  Left    Pain Descriptors / Indicators  Aching    Pain Type  Chronic pain    Pain Onset  More than a month ago          TREATMENT  Ther-ex Hookying bridge 2 x 10; Sit to stand without UE support from mat table, second set at lower height, 2 x 10; Isometric clams with belt, 5s hold, 2 x 10; Isometric adductor ball squeeze, 5s hold, 2 x 10; Quantum single leg press LLE 75# x 15, 80# x 7; Pt educated about laying prone frequently throughout the day; Cold pack applied to left hip during HEP review and demonstration;  Manual Therapy Gentle long axis distraction withbelt assist, added heat to L anterior hip/hip flexors to minimize tone and decrease pain. Pt gradually progressed into more and more extension by raising the table height during distraction; Medial to  lateral mobs with belt, grade II-III, 30s/bout x 3 bouts L hip AP mobs at neutral, grade 3, 30s/bout x 3 bouts; Manual psoas release as well as STM with "the stick" to TFL and rectus femoris; MET hip extension stretch 5s hold, 5s stretch x 3; Patient needs vc for correct form and technique of exercises;                       PT Education - 04/09/18 0900    Education provided  Yes    Education Details  exercise form/technique, HEP progressed and reinforced    Person(s) Educated  Patient    Methods  Explanation    Comprehension  Verbalized understanding       PT Short Term Goals - 03/09/18 1101      PT SHORT TERM GOAL #1   Title  Patient will increase BLE gross strength to 4+/5 as to improve functional strength for independent gait, increased standing tolerance and increased ADL ability.    Time  6    Period  Weeks    Status  New    Target Date  04/20/18      PT SHORT TERM GOAL #2   Title  Patient will be able to perform household work/ chores without increase in symptoms.    Time  6    Period  Weeks    Status  New    Target Date  04/20/18        PT Long Term Goals - 03/09/18 0953      PT LONG TERM GOAL #1   Title  Patient will be independent in home exercise program to improve strength/mobility for better functional independence with ADLs.    Time  12    Status  New    Target Date  06/01/18      PT LONG TERM GOAL #2   Title  Patient will increase six minute walk test distance to >1000 for progression to community ambulator and improve gait ability    Time  12    Period  Weeks    Status  New    Target Date  06/01/18      PT LONG TERM GOAL #3   Title  Patient will increase 10 meter walk test to >1.65ms as to improve gait speed for better community ambulation and to reduce fall risk.    Time  12    Period  Weeks  Status  New    Target Date  06/01/18      PT LONG TERM GOAL #4   Title  Patient will reduce timed up and go to <11 seconds to  reduce fall risk and demonstrate improved transfer/gait ability.    Time  12    Period  Weeks    Status  New    Target Date  06/01/18      PT LONG TERM GOAL #5   Title  Patient will report a worst pain of 3/10 on VAS in  left hip           to improve tolerance with ADLs and reduced symptoms with activities.     Time  12    Period  Weeks    Status  New    Target Date  06/01/18            Plan - 04/09/18 0900    Clinical Impression Statement  Pt reports typical L hip pain upon arrival today. Increased soreness after last session. Pt educated about the soreness he may experience with stretching. By the end of the session he is able to extend his hip all the way to neutral however he continues to stand with a forward flexed posture. Added hooklying bridges to HEP. Pt demonstrates good motivation with low level LLE strengthening. Pt encouraged to continue his HEP and follow-up as scheduled. Encouraged frequent prone positioning to stretch hip flexors.     Rehab Potential  Good    PT Frequency  2x / week    PT Duration  12 weeks    PT Treatment/Interventions  Cryotherapy;Electrical Stimulation;Moist Heat;Gait training;Ultrasound;Stair training;Functional mobility training;Therapeutic exercise;Therapeutic activities;Balance training;Neuromuscular re-education;Patient/family education;Manual techniques;Passive range of motion;Dry needling    PT Next Visit Plan  heat, e-stim, [place], manual therapy, therapeutic exercise    PT Home Exercise Plan  heat, heel raises standing , hip abd, prone positioning, hooklying bridges    Consulted and Agree with Plan of Care  Patient       Patient will benefit from skilled therapeutic intervention in order to improve the following deficits and impairments:  Abnormal gait, Decreased endurance, Decreased mobility, Difficulty walking, Pain, Impaired flexibility, Decreased strength, Decreased safety awareness, Decreased activity tolerance  Visit  Diagnosis: Difficulty in walking, not elsewhere classified  Muscle weakness (generalized)     Problem List Patient Active Problem List   Diagnosis Date Noted  . 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 Osteoarthritis of left hip 02/09/2018  . Constipation   . Change in bowel habits   . Chest pain 05/12/2016  . Strain of rectus abdominis muscle 08/01/2015   Phillips Grout PT, DPT   Jhace Fennell 04/09/2018, 11:18 AM  [place] MAIN [facility] SERVICES [address] [place], [place], [address] Phone: [phone]   Fax:  [phone]  Name: VAIBHAV FOGLEMAN MRN: [id number] Date of Birth: June 09, 1941

## 2018-04-09 NOTE — Patient Instructions (Signed)
Access Code: [email or web address]  URL: [email or web address]  Date: 04/09/2018  Prepared by: Ria Comment   Exercises  Supine Bridge - 10 reps - 2 sets - 3 hold - 2x daily - 7x weekly

## 2018-04-13 ENCOUNTER — Ambulatory Visit: Payer: Medicare HMO

## 2018-04-13 DIAGNOSIS — R262 Difficulty in walking, not elsewhere classified: Secondary | ICD-10-CM

## 2018-04-13 DIAGNOSIS — M6281 Muscle weakness (generalized): Secondary | ICD-10-CM

## 2018-04-13 NOTE — Therapy (Signed)
[place] MAIN [facility] SERVICES [address] [place], [place], [address] Phone: [phone]   Fax:  [phone]  Physical Therapy Treatment  Patient Details  Name: Harold Sandoval MRN: [id number] Date of Birth: 1941/03/24 Referring Provider: Frederik Pear   Encounter Date: 04/13/2018  PT End of Session - 04/13/18 1126    Visit Number  8    Number of Visits  25    Date for PT Re-Evaluation  06/01/18    Authorization Type  progress note 8/10    PT Start Time  1118    PT Stop Time  1205    PT Time Calculation (min)  47 min    Equipment Utilized During Treatment  Gait belt    Activity Tolerance  Patient tolerated treatment well;Patient limited by pain    Behavior During Therapy  [facility] for tasks assessed/performed       Past Medical History:  Diagnosis Date  . Arthritis    "all over" (02/10/2018)  . Balance problem    when first stands.  Since stroke.  . Glaucoma, both eyes   . High cholesterol   . History of gout   . 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 Macular degeneration, left eye   . Sinus headache   . Stroke ([place]) 11/24/2015   denies residual on 02/10/2018  . Type II diabetes mellitus ([place])     Past Surgical History:  Procedure Laterality Date  . APPENDECTOMY    . BACK SURGERY    . CATARACT EXTRACTION, BILATERAL    . COLONOSCOPY WITH PROPOFOL N/A 12/29/2016   Procedure: COLONOSCOPY WITH PROPOFOL;  Surgeon: Lucilla Lame, MD;  Location: [place];  Service: Endoscopy;  Laterality: N/A;  Diabetic - oral meds  . JOINT REPLACEMENT    . LAPAROSCOPIC CHOLECYSTECTOMY    . LOOP RECORDER INSERTION N/A 02/24/2017   Procedure: Loop Recorder Insertion;  Surgeon: Isaias Cowman, MD;  Location: [place] [facility];  Service: Cardiovascular;  Laterality: N/A;  . LUMBAR [place]    . 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  Subjective Assessment - 04/13/18 1125    Subjective  Pt denies any increase in soreness after the last therapy session. He denies pain today but reports some LLE soreness. Pt reports that his L leg has been getting straighter. No specific questions currently.     Pertinent History  Patinet had several cortisone shots to left hip and in left knee. His surgery was 02/10/18 at [place] and was in the hospital and then he went home 02/12/18.  He was using  a RW and had HHPT 2 x / week. He now is using a SPT.     Limitations  Walking;Standing    How long can you sit comfortably?  as long as he wants to    How long can you stand comfortably?   5 mins     How long can you walk comfortably?  5 mins    Patient Stated Goals  Patient wants to be able to walk without the can and mow his lawn and weed it.     Currently in Pain?  No/denies          TREATMENT  Ther-ex Hookying bridge 2  x 10; Sit to stand without UE support from mat table at lowest height x 10, added 2kg med ball x 10; Isometric clams with belt, 5s hold, 2 x 10; Isometric adductor ball squeeze, 5s hold, 2 x 10; Quantum single leg press 80# 2 x 15, Cold pack applied to left hip at end of session (unbilled);  Manual Therapy Gentle long axis distraction withbelt assist, added heat to L anterior hip/hip flexors to minimize tone and decrease pain. Pt gradually progressed into more and more extension by raising the table height during distraction; Medial to lateral mobs with belt, grade II-III, 30s/bout x 3 bouts L hip AP mobs at neutral, grade 3, 30s/bout x 3 bouts; STM with "the stick" to TFL and rectus femoris; MET hip extension stretch 5s hold, 5s stretch x 3; Patient needs vc for correct form and technique of exercises;                      PT Education - 04/13/18 1126    Education  provided  Yes    Education Details  exercise form/technique    Person(s) Educated  Patient    Methods  Explanation    Comprehension  Verbalized understanding       PT Short Term Goals - 03/09/18 1101      PT SHORT TERM GOAL #1   Title  Patient will increase BLE gross strength to 4+/5 as to improve functional strength for independent gait, increased standing tolerance and increased ADL ability.    Time  6    Period  Weeks    Status  New    Target Date  04/20/18      PT SHORT TERM GOAL #2   Title  Patient will be able to perform household work/ chores without increase in symptoms.    Time  6    Period  Weeks    Status  New    Target Date  04/20/18        PT Long Term Goals - 03/09/18 0953      PT LONG TERM GOAL #1   Title  Patient will be independent in home exercise program to improve strength/mobility for better functional independence with ADLs.    Time  12    Status  New    Target Date  06/01/18      PT LONG TERM GOAL #2   Title  Patient will increase six minute walk test distance to >1000 for progression to community ambulator and improve gait ability    Time  12    Period  Weeks    Status  New    Target Date  06/01/18      PT LONG TERM GOAL #3   Title  Patient will increase 10 meter walk test to >1.36ms as to improve gait speed for better community ambulation and to reduce fall risk.    Time  12    Period  Weeks    Status  New    Target Date  06/01/18      PT LONG TERM GOAL #4   Title  Patient will reduce timed up and go to <11 seconds to reduce fall risk and demonstrate improved transfer/gait ability.    Time  12    Period  Weeks    Status  New    Target Date  06/01/18      PT LONG TERM GOAL #5   Title  Patient will report a worst pain  of 3/10 on VAS in  left hip           to improve tolerance with ADLs and reduced symptoms with activities.     Time  12    Period  Weeks    Status  New    Target Date  06/01/18            Plan - 04/13/18 1126     Clinical Impression Statement  Pt denies L hip pain upon arrival today and no increase in soreness after the last session. He is able to get closer to neutral R hip extension today and his ROM progresses faster during session. He continues to stand with forward flexed posture. Encouraged frequent prone positioning. Pt encouraged to continue his HEP and follow-up as scheduled. Encouraged frequent prone positioning to stretch hip flexors.     Rehab Potential  Good    PT Frequency  2x / week    PT Duration  12 weeks    PT Treatment/Interventions  Cryotherapy;Electrical Stimulation;Moist Heat;Gait training;Ultrasound;Stair training;Functional mobility training;Therapeutic exercise;Therapeutic activities;Balance training;Neuromuscular re-education;Patient/family education;Manual techniques;Passive range of motion;Dry needling    PT Next Visit Plan  heat, e-stim, [place], manual therapy, therapeutic exercise    PT Home Exercise Plan  heat, heel raises standing , hip abd, prone positioning, hooklying bridges    Consulted and Agree with Plan of Care  Patient       Patient will benefit from skilled therapeutic intervention in order to improve the following deficits and impairments:  Abnormal gait, Decreased endurance, Decreased mobility, Difficulty walking, Pain, Impaired flexibility, Decreased strength, Decreased safety awareness, Decreased activity tolerance  Visit Diagnosis: Difficulty in walking, not elsewhere classified  Muscle weakness (generalized)     Problem List Patient Active Problem List   Diagnosis Date Noted  . 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 Osteoarthritis of left hip 02/09/2018  . Constipation   . Change in bowel habits   . Chest pain 05/12/2016  . Strain of rectus abdominis muscle 08/01/2015   Phillips Grout PT, DPT   Aiyanah Kalama 04/13/2018, 12:13 PM  [place] MAIN [facility] SERVICES [address] [place], [place],  [address] Phone: [phone]   Fax:  [phone]  Name: Harold Sandoval MRN: [id number] Date of Birth: [birth year]

## 2018-04-15 ENCOUNTER — Ambulatory Visit: Payer: Medicare HMO

## 2018-04-15 DIAGNOSIS — R262 Difficulty in walking, not elsewhere classified: Secondary | ICD-10-CM | POA: Diagnosis not present

## 2018-04-15 DIAGNOSIS — M6281 Muscle weakness (generalized): Secondary | ICD-10-CM

## 2018-04-15 NOTE — Therapy (Signed)
[place] MAIN [facility] SERVICES [address] [place], [place], [address] Phone: [phone]   Fax:  [phone]  Physical Therapy Treatment  Patient Details  Name: Harold Sandoval MRN: [id number] Date of Birth: [birth year] Referring Provider: Frederik Pear   Encounter Date: 04/15/2018  PT End of Session - 04/15/18 1006    Visit Number  9    Number of Visits  25    Date for PT Re-Evaluation  06/01/18    Authorization Type  progress note 9/10    PT Start Time  0930    PT Stop Time  1015    PT Time Calculation (min)  45 min    Equipment Utilized During Treatment  Gait belt    Activity Tolerance  Patient tolerated treatment well    Behavior During Therapy  [facility] for tasks assessed/performed       Past Medical History:  Diagnosis Date  . Arthritis    "all over" (02/10/2018)  . Balance problem    when first stands.  Since stroke.  . Glaucoma, both eyes   . High cholesterol   . History of gout   . 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 Macular degeneration, left eye   . Sinus headache   . Stroke ([place]) 11/24/2015   denies residual on 02/10/2018  . Type II diabetes mellitus ([place])     Past Surgical History:  Procedure Laterality Date  . APPENDECTOMY    . BACK SURGERY    . CATARACT EXTRACTION, BILATERAL    . COLONOSCOPY WITH PROPOFOL N/A 12/29/2016   Procedure: COLONOSCOPY WITH PROPOFOL;  Surgeon: Lucilla Lame, MD;  Location: [place];  Service: Endoscopy;  Laterality: N/A;  Diabetic - oral meds  . JOINT REPLACEMENT    . LAPAROSCOPIC CHOLECYSTECTOMY    . LOOP RECORDER INSERTION N/A 02/24/2017   Procedure: Loop Recorder Insertion;  Surgeon: Isaias Cowman, MD;  Location: [place] CV [facility];  Service: Cardiovascular;  Laterality: N/A;  . LUMBAR [place]    . 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  Subjective Assessment - 04/15/18 1005    Subjective  Pt reports mild increase in soreness after the last therapy session. He feels like he is getting better with therapy. He denies pain today but reports some LLE soreness. Pt reports that his L leg has been getting straighter especially in the mornings. No specific questions currently.     Pertinent History  Patinet had several cortisone shots to left hip and in left knee. His surgery was 02/10/18 at [place] and was in the hospital and then he went home 02/12/18.  He was using  a RW and had HHPT 2 x / week. He now is using a SPT.     Limitations  Walking;Standing    How long can you sit comfortably?  as long as he wants to    How long can you stand comfortably?   5 mins     How long can you walk comfortably?  5 mins    Patient Stated Goals  Patient wants to be able to walk without the can and mow his lawn and weed it.     Currently in Pain?  No/denies  TREATMENT  Ther-ex Sit to stand without UE supportfrom mat table at lowest height x 10, added 5# weight bar with overhead press during squat x 10; Quantum single leg press 80# x 15, 90# x 15, Standing hip abduction with 2# ankle weights 2 x 10; Standing hip extension with 2# ankle weights 2 x 10; Cold pack applied to left hip at end of session (unbilled);  Manual Therapy Gentle long axis distraction withbelt assist, added heat to L anterior hip/hip flexors to minimize tone and decrease pain. Pt gradually progressed into more and more extension by raising the table height during distraction; Medial to lateral mobs with belt, grade II-III, 30s/bout x 3 bouts L hip AP mobs at neutral, grade 3, 30s/bout x 3bouts; Prone L hip PA mobs, grade 1, 30s/bout x 3 bouts; STM with "the stick" to TFL, rectus femoris, hamstrings, and glut med; MET hip extension stretch 5s hold, 5s  stretch x 3; Patient needs vc for correct form and technique of exercises;                        PT Education - 04/15/18 1006    Education provided  Yes    Education Details  exercise form/technique, prone positioning    Person(s) Educated  Patient    Methods  Explanation    Comprehension  Verbalized understanding       PT Short Term Goals - 03/09/18 1101      PT SHORT TERM GOAL #1   Title  Patient will increase BLE gross strength to 4+/5 as to improve functional strength for independent gait, increased standing tolerance and increased ADL ability.    Time  6    Period  Weeks    Status  New    Target Date  04/20/18      PT SHORT TERM GOAL #2   Title  Patient will be able to perform household work/ chores without increase in symptoms.    Time  6    Period  Weeks    Status  New    Target Date  04/20/18        PT Long Term Goals - 03/09/18 0953      PT LONG TERM GOAL #1   Title  Patient will be independent in home exercise program to improve strength/mobility for better functional independence with ADLs.    Time  12    Status  New    Target Date  06/01/18      PT LONG TERM GOAL #2   Title  Patient will increase six minute walk test distance to >1000 for progression to community ambulator and improve gait ability    Time  12    Period  Weeks    Status  New    Target Date  06/01/18      PT LONG TERM GOAL #3   Title  Patient will increase 10 meter walk test to >1.41ms as to improve gait speed for better community ambulation and to reduce fall risk.    Time  12    Period  Weeks    Status  New    Target Date  06/01/18      PT LONG TERM GOAL #4   Title  Patient will reduce timed up and go to <11 seconds to reduce fall risk and demonstrate improved transfer/gait ability.    Time  12    Period  Weeks    Status  New  Target Date  06/01/18      PT LONG TERM GOAL #5   Title  Patient will report a worst pain of 3/10 on VAS in  left hip            to improve tolerance with ADLs and reduced symptoms with activities.     Time  12    Period  Weeks    Status  New    Target Date  06/01/18            Plan - 04/15/18 1150    Clinical Impression Statement  Pt denies L hip pain upon arrival today and only mild increase in soreness after the last session. He is able to get closer to neutral L hip extension today and his ROM progresses faster again today than during prior sessions. He is able to lay prone for an extended time during session today and tolerates low grade posterior to anterior L hip mobs. Pt encouraged to continue his HEP and follow-up as scheduled. Encouraged frequent prone positioning to stretch hip flexors.     Rehab Potential  Good    PT Frequency  2x / week    PT Duration  12 weeks    PT Treatment/Interventions  Cryotherapy;Electrical Stimulation;Moist Heat;Gait training;Ultrasound;Stair training;Functional mobility training;Therapeutic exercise;Therapeutic activities;Balance training;Neuromuscular re-education;Patient/family education;Manual techniques;Passive range of motion;Dry needling    PT Next Visit Plan  Repeat outcome measures and update goals; heat, e-stim, [place], manual therapy, therapeutic exercise    PT Home Exercise Plan  heat, heel raises standing , hip abd, prone positioning, hooklying bridges    Consulted and Agree with Plan of Care  Patient       Patient will benefit from skilled therapeutic intervention in order to improve the following deficits and impairments:  Abnormal gait, Decreased endurance, Decreased mobility, Difficulty walking, Pain, Impaired flexibility, Decreased strength, Decreased safety awareness, Decreased activity tolerance  Visit Diagnosis: Difficulty in walking, not elsewhere classified  Muscle weakness (generalized)     Problem List Patient Active Problem List   Diagnosis Date Noted  . 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 Osteoarthritis of left hip 02/09/2018  .  Constipation   . Change in bowel habits   . Chest pain 05/12/2016  . Strain of rectus abdominis muscle 08/01/2015   Phillips Grout PT, DPT   Huprich,Jason 04/15/2018, 4:29 PM  [place] MAIN [facility] SERVICES [address] [place], [place], [address] Phone: [phone]   Fax:  [phone]  Name: CHRISTOPHERJAME CARNELL MRN: [id number] Date of Birth: 08/28/1941

## 2018-04-19 ENCOUNTER — Ambulatory Visit: Payer: Medicare HMO

## 2018-04-19 VITALS — BP 134/76 | HR 76

## 2018-04-19 DIAGNOSIS — R262 Difficulty in walking, not elsewhere classified: Secondary | ICD-10-CM | POA: Diagnosis not present

## 2018-04-19 DIAGNOSIS — M6281 Muscle weakness (generalized): Secondary | ICD-10-CM

## 2018-04-19 NOTE — Therapy (Signed)
[place] MAIN [facility] SERVICES [address] [place], [place], [address] Phone: [phone]   Fax:  [phone]  Physical Therapy Treatment/Progress Note  Dates of reporting period  03/09/18   to   04/19/18  Patient Details  Name: Harold Sandoval MRN: [id number] Date of Birth: [birth year] Referring Provider: Frederik Pear   Encounter Date: 04/19/2018  PT End of Session - 04/19/18 1037    Visit Number  10    Number of Visits  25    Date for PT Re-Evaluation  06/01/18    Authorization Type  progress note 10/10    PT Start Time  1030    PT Stop Time  1115    PT Time Calculation (min)  45 min    Equipment Utilized During Treatment  Gait belt    Activity Tolerance  Patient tolerated treatment well    Behavior During Therapy  WFL for tasks assessed/performed       Past Medical History:  Diagnosis Date  . Arthritis    "all over" (02/10/2018)  . Balance problem    when first stands.  Since stroke.  . Glaucoma, both eyes   . High cholesterol   . History of gout   . 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 Macular degeneration, left eye   . Sinus headache   . Stroke ([place]) 11/24/2015   denies residual on 02/10/2018  . Type II diabetes mellitus ([place])     Past Surgical History:  Procedure Laterality Date  . APPENDECTOMY    . BACK SURGERY    . CATARACT EXTRACTION, BILATERAL    . COLONOSCOPY WITH PROPOFOL N/A 12/29/2016   Procedure: COLONOSCOPY WITH PROPOFOL;  Surgeon: Lucilla Lame, MD;  Location: [place];  Service: Endoscopy;  Laterality: N/A;  Diabetic - oral meds  . JOINT REPLACEMENT    . LAPAROSCOPIC CHOLECYSTECTOMY    . LOOP RECORDER INSERTION N/A 02/24/2017   Procedure: Loop Recorder Insertion;  Surgeon: Isaias Cowman, MD;  Location: [place] [facility];  Service: Cardiovascular;  Laterality: N/A;  . LUMBAR [place]    . 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 He rates current pain as 3/10 when standing but no pain at rest. He is frustrated by how long it has taken him to see progress. No specific questions at this time.     Pertinent History  Patinet had several cortisone shots to left hip and in left knee. His surgery was 02/10/18 at [place] and was in the hospital and then he went home 02/12/18.  He was using  a RW and had HHPT 2 x / week. He now is using a SPT.     Limitations  Walking;Standing    How long can you sit comfortably?  as long as he wants to    How long can you stand comfortably?   5 mins     How long can you walk comfortably?  5 mins    Patient Stated Goals  Patient wants to be able to walk without the can and mow his lawn and weed it.  Currently in Pain?  Yes    Pain Score  3     Pain Location  Hip    Pain Orientation  Left    Pain Descriptors / Indicators  Aching    Pain Type  Chronic pain    Pain Onset  More than a month ago         [facility] PT Assessment - 04/19/18 1057      Observation/Other Assessments   Other Surveys   --      6 Minute Walk- Baseline   6 Minute Walk- Baseline  yes    BP (mmHg)  134/76    HR (bpm)  76    02 Sat (%RA)  100 %    Modified Borg Scale for Dyspnea  0- Nothing at all    Perceived Rate of Exertion (Borg)  6-      6 Minute walk- Post Test   6 Minute Walk Post Test  yes    BP (mmHg)  160/79    HR (bpm)  97    02 Sat (%RA)  99 %    Modified Borg Scale for Dyspnea  0- Nothing at all    Perceived Rate of Exertion (Borg)  6-      6 minute walk test results    Aerobic Endurance Distance Walked  1050      Standardized Balance Assessment   Standardized Balance Assessment  Timed Up and Go Test;Five  Times Sit to Stand;10 meter walk test    Five times sit to stand comments   15.0s    10 Meter Walk  self-selected: 10.1s = 0.99 m/s fastest: 9.1s = 1.1 m/s      Timed Up and Go Test   TUG  Normal TUG    Normal TUG (seconds)  12.5         TREATMENT  Ther-ex Repeated outcome measures and updated goals with patient TUG: 12.5s 43mgait speed:  self-selected: 10.1s = 0.99 m/s fastest: 9.1s = 1.1 m/s 5TSTS: 15.0s 6MWT: 1050' Initial measures from 03/09/18 TUG: 24.61, 6MWT: 530', 112m0.47 m/s  Sit to stand without UE supportfrom mat tableat lowest heightwith 5# weight bar with overhead press during stand 2 x 10; Quantum L single leg press 90# x 15, 97.5# x 15;   Manual Therapy Gentle long axis distraction withbelt assist, added heat to L anterior hip/hip flexors to minimize tone and decrease pain. Pt gradually progressed into more and more extension by raising the table height during distraction; L hip AP mobs at neutral, grade 3, 30s/bout x 3bouts progressing further into L hip extension; Prone L hip PA mobs, grade 1, 30s/bout x 3 bouts; MET hip extension stretch 5s hold, 5s stretch x 3; Patient needs vc for correct form and technique of exercises;                      PT Education - 04/19/18 1037    Education provided  Yes    Education Details  exercise form/technique, outcome measures, and progress    Person(s) Educated  Patient    Methods  Explanation    Comprehension  Verbalized understanding       PT Short Term Goals - 04/19/18 1038      PT SHORT TERM GOAL #1   Title  Patient will increase BLE gross strength to 4+/5 as to improve functional strength for independent gait, increased standing tolerance and increased ADL ability.    Baseline  04/19/18:  not tested     Time  6    Period  Weeks    Status  Deferred      PT SHORT TERM GOAL #2   Title  Patient will be able to perform household work/ chores without increase in symptoms.    Baseline   04/19/18: pain increases but able to perform    Time  6    Period  Weeks    Status  Partially Met    Target Date  04/20/18        PT Long Term Goals - 04/19/18 1039      PT LONG TERM GOAL #1   Title  Patient will be independent in home exercise program to improve strength/mobility for better functional independence with ADLs.    Time  12    Status  On-going    Target Date  06/01/18      PT LONG TERM GOAL #2   Title  Patient will increase six minute walk test distance to >1000 for progression to community ambulator and improve gait ability    Baseline  03/09/18: 530'; 04/19/18: 1050'    Time  12    Period  Weeks    Status  Achieved    Target Date  06/01/18      PT LONG TERM GOAL #3   Title  Patient will increase 10 meter walk test to >1.20ms as to improve gait speed for better community ambulation and to reduce fall risk.    Baseline  03/09/09: 0.47 m/s; 04/19/18:  self-selected: 10.1s = 0.99 m/s fastest: 9.1s = 1.1 m/s    Time  12    Period  Weeks    Status  Partially Met    Target Date  06/01/18      PT LONG TERM GOAL #4   Title  Patient will reduce timed up and go to <11 seconds to reduce fall risk and demonstrate improved transfer/gait ability.    Baseline  03/09/18: 24.61s; 04/19/18: 12.5s    Time  12    Period  Weeks    Status  Partially Met    Target Date  06/01/18      PT LONG TERM GOAL #5   Title  Patient will report a worst pain of 3/10 on VAS in  left hip           to improve tolerance with ADLs and reduced symptoms with activities.     Baseline  04/19/18: 7-8/10    Time  12    Period  Weeks    Status  On-going    Target Date  06/01/18            Plan - 04/19/18 1038    Clinical Impression Statement  Pt denies reporting mild L hip pain upon arrival today. With each session he is able to get closer to neutral L hip extension and his ROM progresses faster during the session. He is able to lay prone for an extended time during session today. His Timed Up and Go,  Five Time Sit to Stand, 146mait speed, and Six Minute Walk Test all improved from the initial evaluation.Pt encouraged to continue his HEP and follow-up as scheduled. Pt will benefit from PT services to address deficits in strength, balance, and mobility in order to return to full function at home.     Clinical Presentation  Evolving    Clinical Decision Making  Low    Rehab Potential  Good  PT Frequency  2x / week    PT Duration  12 weeks    PT Treatment/Interventions  Cryotherapy;Electrical Stimulation;Moist Heat;Gait training;Ultrasound;Stair training;Functional mobility training;Therapeutic exercise;Therapeutic activities;Balance training;Neuromuscular re-education;Patient/family education;Manual techniques;Passive range of motion;Dry needling    PT Next Visit Plan  heat, e-stim, [place], manual therapy, therapeutic exercise    PT Home Exercise Plan  heat, heel raises standing , hip abd, prone positioning, hooklying bridges    Consulted and Agree with Plan of Care  Patient       Patient will benefit from skilled therapeutic intervention in order to improve the following deficits and impairments:  Abnormal gait, Decreased endurance, Decreased mobility, Difficulty walking, Pain, Impaired flexibility, Decreased strength, Decreased safety awareness, Decreased activity tolerance  Visit Diagnosis: Difficulty in walking, not elsewhere classified  Muscle weakness (generalized)     Problem List Patient Active Problem List   Diagnosis Date Noted  . 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 Osteoarthritis of left hip 02/09/2018  . Constipation   . Change in bowel habits   . Chest pain 05/12/2016  . Strain of rectus abdominis muscle 08/01/2015   Phillips Grout PT, DPT   Nevaeh Casillas 04/19/2018, 3:45 PM  [place] MAIN [facility] SERVICES [address] [place], [place], [address] Phone: [phone]   Fax:  [phone]  Name: ASAPH SERENA MRN: [id number] Date of Birth: November 22, 1941

## 2018-04-21 ENCOUNTER — Ambulatory Visit: Payer: Medicare HMO

## 2018-04-21 DIAGNOSIS — M25652 Stiffness of left hip, not elsewhere classified: Secondary | ICD-10-CM

## 2018-04-21 DIAGNOSIS — R262 Difficulty in walking, not elsewhere classified: Secondary | ICD-10-CM

## 2018-04-21 DIAGNOSIS — M6281 Muscle weakness (generalized): Secondary | ICD-10-CM

## 2018-04-21 NOTE — Therapy (Signed)
[place] MAIN [facility] SERVICES [address] [place], [place], [address] Phone: [phone]   Fax:  [phone]  Physical Therapy Treatment  Patient Details  Name: Harold Sandoval MRN: [id number] Date of Birth: [birth year] Referring Provider: Frederik Pear   Encounter Date: 04/21/2018  PT End of Session - 04/21/18 1705    Visit Number  11    Number of Visits  25    Date for PT Re-Evaluation  06/01/18    Authorization Type  progress note 1/10    PT Start Time  1030    PT Stop Time  1115    PT Time Calculation (min)  45 min    Equipment Utilized During Treatment  Gait belt    Activity Tolerance  Patient tolerated treatment well    Behavior During Therapy  [facility] for tasks assessed/performed       Past Medical History:  Diagnosis Date  . Arthritis    "all over" (02/10/2018)  . Balance problem    when first stands.  Since stroke.  . Glaucoma, both eyes   . High cholesterol   . History of gout   . 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 Macular degeneration, left eye   . Sinus headache   . Stroke ([place]) 11/24/2015   denies residual on 02/10/2018  . Type II diabetes mellitus ([place])     Past Surgical History:  Procedure Laterality Date  . APPENDECTOMY    . BACK SURGERY    . CATARACT EXTRACTION, BILATERAL    . COLONOSCOPY WITH PROPOFOL N/A 12/29/2016   Procedure: COLONOSCOPY WITH PROPOFOL;  Surgeon: Lucilla Lame, MD;  Location: [place];  Service: Endoscopy;  Laterality: N/A;  Diabetic - oral meds  . JOINT REPLACEMENT    . LAPAROSCOPIC CHOLECYSTECTOMY    . LOOP RECORDER INSERTION N/A 02/24/2017   Procedure: Loop Recorder Insertion;  Surgeon: Isaias Cowman, MD;  Location: [place] [facility];  Service: Cardiovascular;  Laterality: N/A;  . LUMBAR [place]    . 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  Subjective Assessment - 04/21/18 1155    Subjective  Pt denies L hip pain today but continues to complain of some discomfort in his L knee. He is frustrated that his L knee has been limiting for him. He reports that he is continuing to get onto his stomach frequently at home. No specific questions at this time.     Pertinent History  Patinet had several cortisone shots to left hip and in left knee. His surgery was 02/10/18 at [place] and was in the hospital and then he went home 02/12/18.  He was using  a RW and had HHPT 2 x / week. He now is using a SPT.     Limitations  Walking;Standing    How long can you sit comfortably?  as long as he wants to    How long can you stand comfortably?   5 mins     How long can you walk comfortably?  5 mins    Patient Stated Goals  Patient wants to be able to walk without the can and mow his lawn and weed it.     Currently in Pain?  No/denies  Pain Onset  --       TREATMENT  Ther-ex Sit to stand without UE supportfrom mat tableat lowest height with 7# dumbell in goblet squat position 2 x 10; Quantum L single leg press 105# x 10, x 15; Cold pack applied to left hipat end of session (unbilled);  Manual Therapy Gentle long axis distraction withbelt assist, added heat to L anterior hip/hip flexors to minimize tone and decrease pain. Pt gradually progressed into more and more extension by raising the table height during distraction; L hip AP mobs at neutral, grade 3, 30s/bout x 3bouts; Prone L hip PA mobs, grade 1, 30s/bout x 3 bouts; STM with "the stick" to TFL, rectus femoris, hamstrings, and glut med; MET hip extension stretch 5s hold, 5s stretch x 3; Patient needs vc for correct form and technique of exercises                        PT Education - 04/21/18 1705    Education provided  Yes    Education Details   exercise form/technique    Person(s) Educated  Patient    Methods  Explanation    Comprehension  Verbalized understanding       PT Short Term Goals - 04/19/18 1038      PT SHORT TERM GOAL #1   Title  Patient will increase BLE gross strength to 4+/5 as to improve functional strength for independent gait, increased standing tolerance and increased ADL ability.    Baseline  04/19/18: not tested     Time  6    Period  Weeks    Status  Deferred      PT SHORT TERM GOAL #2   Title  Patient will be able to perform household work/ chores without increase in symptoms.    Baseline  04/19/18: pain increases but able to perform    Time  6    Period  Weeks    Status  Partially Met    Target Date  04/20/18        PT Long Term Goals - 04/19/18 1039      PT LONG TERM GOAL #1   Title  Patient will be independent in home exercise program to improve strength/mobility for better functional independence with ADLs.    Time  12    Status  On-going    Target Date  06/01/18      PT LONG TERM GOAL #2   Title  Patient will increase six minute walk test distance to >1000 for progression to community ambulator and improve gait ability    Baseline  03/09/18: 530'; 04/19/18: 1050'    Time  12    Period  Weeks    Status  Achieved    Target Date  06/01/18      PT LONG TERM GOAL #3   Title  Patient will increase 10 meter walk test to >1.1ms as to improve gait speed for better community ambulation and to reduce fall risk.    Baseline  03/09/09: 0.47 m/s; 04/19/18:  self-selected: 10.1s = 0.99 m/s fastest: 9.1s = 1.1 m/s    Time  12    Period  Weeks    Status  Partially Met    Target Date  06/01/18      PT LONG TERM GOAL #4   Title  Patient will reduce timed up and go to <11 seconds to reduce fall risk and demonstrate improved transfer/gait ability.  Baseline  03/09/18: 24.61s; 04/19/18: 12.5s    Time  12    Period  Weeks    Status  Partially Met    Target Date  06/01/18      PT LONG TERM GOAL #5    Title  Patient will report a worst pain of 3/10 on VAS in  left hip           to improve tolerance with ADLs and reduced symptoms with activities.     Baseline  04/19/18: 7-8/10    Time  12    Period  Weeks    Status  On-going    Target Date  06/01/18            Plan - 04/21/18 1115    Clinical Impression Statement  Pt denies any increase in pain during session. He continues to progress faster to neutral L hip extension. More time spent with patient today in prone to improve hip mobility. Notable improvement in R step length following manual therapy.Pt encouraged to continue his HEP and follow-up as scheduled. Pt will benefit from PT services to address deficits in strength, balance, and mobility in order to return to full function at home.     Rehab Potential  Good    PT Frequency  2x / week    PT Duration  12 weeks    PT Treatment/Interventions  Cryotherapy;Electrical Stimulation;Moist Heat;Gait training;Ultrasound;Stair training;Functional mobility training;Therapeutic exercise;Therapeutic activities;Balance training;Neuromuscular re-education;Patient/family education;Manual techniques;Passive range of motion;Dry needling    PT Next Visit Plan  heat, e-stim, [place], manual therapy, therapeutic exercise    PT Home Exercise Plan  heat, heel raises standing , hip abd, prone positioning, hooklying bridges    Consulted and Agree with Plan of Care  Patient       Patient will benefit from skilled therapeutic intervention in order to improve the following deficits and impairments:  Abnormal gait, Decreased endurance, Decreased mobility, Difficulty walking, Pain, Impaired flexibility, Decreased strength, Decreased safety awareness, Decreased activity tolerance  Visit Diagnosis: Difficulty in walking, not elsewhere classified  Muscle weakness (generalized)  Stiffness of left hip, not elsewhere classified     Problem List Patient Active Problem List   Diagnosis Date Noted  . Primary  osteoarthritis of left hip 02/10/2018  . Osteoarthritis of left hip 02/09/2018  . Constipation   . Change in bowel habits   . Chest pain 05/12/2016  . Strain of rectus abdominis muscle 08/01/2015    Terri Rorrer 04/21/2018, 5:08 PM  [place] MAIN [facility] SERVICES [address] [place], [place], [address] Phone: [phone]   Fax:  [phone]  Name: Harold Sandoval MRN: [id number] Date of Birth: [birth year]

## 2018-04-26 ENCOUNTER — Ambulatory Visit: Payer: Medicare HMO

## 2018-04-26 DIAGNOSIS — R262 Difficulty in walking, not elsewhere classified: Secondary | ICD-10-CM | POA: Diagnosis not present

## 2018-04-26 DIAGNOSIS — M6281 Muscle weakness (generalized): Secondary | ICD-10-CM

## 2018-04-26 NOTE — Therapy (Signed)
[place] [facility] MAIN REHAB SERVICES [address] , [place], [address] Phone: [phone]   Fax:  [phone]  Physical Therapy Treatment  Patient Details  Name: Harold Sandoval MRN: [id number] Date of Birth: 02/09/1941 Referring Provider: ROWAN, FRANK   Encounter Date: 04/26/2018  PT End of Session - 04/26/18 1056    Visit Number  12    Number of Visits  25    Date for PT Re-Evaluation  06/01/18    Authorization Type  progress note 2/10    PT Start Time  1030    PT Stop Time  1115    PT Time Calculation (min)  45 min    Equipment Utilized During Treatment  Gait belt    Activity Tolerance  Patient tolerated treatment well    Behavior During Therapy  WFL for tasks assessed/performed       Past Medical History:  Diagnosis Date  . Arthritis    "all over" (02/10/2018)  . Balance problem    when first stands.  Since stroke.  . Glaucoma, both eyes   . High cholesterol   . History of gout   . 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 Macular degeneration, left eye   . Sinus headache   . Stroke ([facility]) 11/24/2015   denies residual on 02/10/2018  . Type II diabetes mellitus ([facility])     Past Surgical History:  Procedure Laterality Date  . APPENDECTOMY    . BACK SURGERY    . CATARACT EXTRACTION, BILATERAL    . COLONOSCOPY WITH PROPOFOL N/A 12/29/2016   Procedure: COLONOSCOPY WITH PROPOFOL;  Surgeon: Darren Wohl, MD;  Location: [facility];  Service: Endoscopy;  Laterality: N/A;  Diabetic - oral meds  . JOINT REPLACEMENT    . LAPAROSCOPIC CHOLECYSTECTOMY    . LOOP RECORDER INSERTION N/A 02/24/2017   Procedure: Loop Recorder Insertion;  Surgeon: Alexander Paraschos, MD;  Location: [facility];  Service: Cardiovascular;  Laterality: N/A;  . LUMBAR [place]    . 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  Subjective Assessment - 04/26/18 1053    Subjective  Pt denies L hip pain today but is complaining of some discomfort in his L upper thigh currently. He returns to see the orthopedist tomorrow and will discuss this with him. He is continuing to work on stretching his L hip in prone. No specific questions currently.    Pertinent History  Patinet had several cortisone shots to left hip and in left knee. His surgery was 02/10/18 at [place] and was in the hospital and then he went home 02/12/18.  He was using  a RW and had HHPT 2 x / week. He now is using a SPT.     Limitations  Walking;Standing    How long can you sit comfortably?  as long as he wants to    How long can you stand comfortably?   5 mins     How long can you walk comfortably?  5 mins    Patient Stated Goals  Patient wants to be able to walk without the can and mow his lawn and weed it.     Currently in Pain?  No/denies            TREATMENT   Ther-ex Sit to stand without UE supportfrom mat tableat lowest height with 7# dumbell in goblet squat position 2 x 10, 10# medicine ball x 10; Single leg forward step down in single leg stance on LLE in parallel bars x 10; Stair training with faded UE support progressing from bilateral to unilateral and finally no UE support x 6; Hooklying bridges 2 x 10 without UE support; Cold pack applied to left hipat end of session (unbilled);  Gait Training Gait training x 5 minutes on Biodex treadmill with biofeedback for step length. Pt provided cues from therapist from treadmill output to increase step length bilaterally but especially with RLE. Cues provided for upright posture and speed intermittently adjusted during session based on quality of gait;  Manual Therapy Gentle long axis distraction withbelt assist, added heat to L anterior hip/hip flexors to minimize tone and decrease  pain. Pt gradually progressed into more and more extension by raising the table height during distraction; L hip AP mobs at neutral, grade 3, 30s/bout x 3bouts; Prone L hip PA mobs, grade 1-2, 30s/bout x 3 bouts; STM with "the stick" to TFL,rectus femoris, hamstrings, and glut med; MET hip extension stretch 5s hold, 5s stretch x 3; Patient needs vc for correct form and technique of exercises                      PT Education - 04/26/18 1056    Education provided  Yes    Education Details  exercise form/technique    Person(s) Educated  Patient    Methods  Explanation    Comprehension  Verbalized understanding       PT Short Term Goals - 04/19/18 1038      PT SHORT TERM GOAL #1   Title  Patient will increase BLE gross strength to 4+/5 as to improve functional strength for independent gait, increased standing tolerance and increased ADL ability.    Baseline  04/19/18: not tested     Time  6    Period  Weeks    Status  Deferred      PT SHORT TERM GOAL #2   Title  Patient will be able to perform household work/ chores without increase in symptoms.    Baseline  04/19/18: pain increases but able to perform    Time  6    Period  Weeks    Status  Partially Met    Target Date  04/20/18        PT Long Term Goals - 04/19/18 1039      PT LONG TERM GOAL #1   Title  Patient will be independent in home exercise program to improve strength/mobility for better functional independence with ADLs.    Time  12    Status  On-going    Target Date  06/01/18      PT LONG TERM GOAL #2   Title  Patient will increase six minute walk test distance to >1000 for progression to community ambulator and improve gait ability    Baseline  03/09/18: 530'; 04/19/18: 1050'    Time  12    Period  Weeks    Status  Achieved    Target Date  06/01/18      PT LONG TERM GOAL #3   Title  Patient will increase 10 meter walk test to >1.0m/s as to improve gait speed for better community  ambulation and to reduce fall risk.    Baseline  03/09/09: 0.47   m/s; 04/19/18:  self-selected: 10.1s = 0.99 m/s fastest: 9.1s = 1.1 m/s    Time  12    Period  Weeks    Status  Partially Met    Target Date  06/01/18      PT LONG TERM GOAL #4   Title  Patient will reduce timed up and go to <11 seconds to reduce fall risk and demonstrate improved transfer/gait ability.    Baseline  03/09/18: 24.61s; 04/19/18: 12.5s    Time  12    Period  Weeks    Status  Partially Met    Target Date  06/01/18      PT LONG TERM GOAL #5   Title  Patient will report a worst pain of 3/10 on VAS in  left hip           to improve tolerance with ADLs and reduced symptoms with activities.     Baseline  04/19/18: 7-8/10    Time  12    Period  Weeks    Status  On-going    Target Date  06/01/18            Plan - 04/26/18 1057    Clinical Impression Statement  Pt denies any increase in pain during session. He continues to progress to close to neutral hip extension with stretching and mobilizations. Pt requested avoiding leg press today as he thinks it might be contributing to some increase in his L thigh pain. He is able to lay prone for an extended time during session today. His Timed Up and Go, Five Time Sit to Stand, 10m gait speed, and Six Minute Walk Test all improved from the initial evaluation when they were rechecked on 04/19/18. He has a follow-up appointment with his orthopedist tomorrow. Pt encouraged to continue his HEP and follow-up as scheduled. Pt will continue to benefit from PT services to address deficits in strength, balance, and mobility in order to return to full function at home.     Rehab Potential  Good    PT Frequency  2x / week    PT Duration  12 weeks    PT Treatment/Interventions  Cryotherapy;Electrical Stimulation;Moist Heat;Gait training;Ultrasound;Stair training;Functional mobility training;Therapeutic exercise;Therapeutic activities;Balance training;Neuromuscular  re-education;Patient/family education;Manual techniques;Passive range of motion;Dry needling    PT Next Visit Plan  manual therapy, therapeutic exercise    PT Home Exercise Plan  heat, heel raises standing , hip abd, prone positioning, hooklying bridges    Consulted and Agree with Plan of Care  Patient       Patient will benefit from skilled therapeutic intervention in order to improve the following deficits and impairments:  Abnormal gait, Decreased endurance, Decreased mobility, Difficulty walking, Pain, Impaired flexibility, Decreased strength, Decreased safety awareness, Decreased activity tolerance  Visit Diagnosis: Difficulty in walking, not elsewhere classified  Muscle weakness (generalized)     Problem List Patient Active Problem List   Diagnosis Date Noted  . 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 Osteoarthritis of left hip 02/09/2018  . Constipation   . Change in bowel habits   . Chest pain 05/12/2016  . Strain of rectus abdominis muscle 08/01/2015   Jason D Huprich PT, DPT   Huprich,Jason 04/26/2018, 2:22 PM  [facility] MA[facility] REHAB SERVICES [address] [place], [place], [address] Phone: [phone]   Fax:  [phone]  Name: Derrich J Dossantos MRN: [id number] Date of Birth: 09/19/1941   

## 2018-04-28 ENCOUNTER — Ambulatory Visit: Payer: Medicare HMO

## 2018-04-28 DIAGNOSIS — R262 Difficulty in walking, not elsewhere classified: Secondary | ICD-10-CM | POA: Diagnosis not present

## 2018-04-28 DIAGNOSIS — M25652 Stiffness of left hip, not elsewhere classified: Secondary | ICD-10-CM

## 2018-04-28 DIAGNOSIS — M6281 Muscle weakness (generalized): Secondary | ICD-10-CM

## 2018-04-28 NOTE — Therapy (Signed)
[place] MAIN [facility] SERVICES [address] [place], [place], [address] Phone: [phone]   Fax:  [phone]  Physical Therapy Treatment  Patient Details  Name: Harold Sandoval MRN: [id number] Date of Birth: 12-12-1940 Referring Provider: Frederik Pear   Encounter Date: 04/28/2018  PT End of Session - 04/28/18 1539    Visit Number  13    Number of Visits  25    Date for PT Re-Evaluation  06/01/18    Authorization Type  progress note 3/10    PT Start Time  1515    PT Stop Time  1600    PT Time Calculation (min)  45 min    Equipment Utilized During Treatment  Gait belt    Activity Tolerance  Patient tolerated treatment well    Behavior During Therapy  [facility] for tasks assessed/performed       Past Medical History:  Diagnosis Date  . Arthritis    "all over" (02/10/2018)  . Balance problem    when first stands.  Since stroke.  . Glaucoma, both eyes   . High cholesterol   . History of gout   . 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 Macular degeneration, left eye   . Sinus headache   . Stroke ([place]) 11/24/2015   denies residual on 02/10/2018  . Type II diabetes mellitus ([place])     Past Surgical History:  Procedure Laterality Date  . APPENDECTOMY    . BACK SURGERY    . CATARACT EXTRACTION, BILATERAL    . COLONOSCOPY WITH PROPOFOL N/A 12/29/2016   Procedure: COLONOSCOPY WITH PROPOFOL;  Surgeon: Lucilla Lame, MD;  Location: [place];  Service: Endoscopy;  Laterality: N/A;  Diabetic - oral meds  . JOINT REPLACEMENT    . LAPAROSCOPIC CHOLECYSTECTOMY    . LOOP RECORDER INSERTION N/A 02/24/2017   Procedure: Loop Recorder Insertion;  Surgeon: Isaias Cowman, MD;  Location: [place] CV LAB;  Service: Cardiovascular;  Laterality: N/A;  . LUMBAR [place]    . 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  Subjective Assessment - 04/28/18 1537    Subjective  Pt reporting 8/10 L hip and thigh pain today. He saw the orthopedist yesterday who took radiographs which pt reports found "extra bone growing." Pt is frustrated due to this news. He is continuing to work on stretching his L hip in prone. No specific questions currently.    Pertinent History  Patinet had several cortisone shots to left hip and in left knee. His surgery was 02/10/18 at [place] and was in the hospital and then he went home 02/12/18.  He was using  a RW and had HHPT 2 x / week. He now is using a SPT.     Limitations  Walking;Standing    How long can you sit comfortably?  as long as he wants to    How long can you stand comfortably?   5 mins     How long can you walk comfortably?  5 mins    Patient Stated Goals  Patient wants to be able to walk without the can and mow his lawn and weed it.     Currently in Pain?  Yes    Pain Score  8     Pain Location  Hip    Pain Orientation  Left    Pain Descriptors / Indicators  Aching    Pain Type  Chronic pain    Pain Onset  More than a month ago    Pain Frequency  Constant           TREATMENT   Ther-ex R sidelying L SLR abduction 2 x 10; Hooklying bridges 2 x 10; Hooklying isometric clams with belt 5s hold x 10; Hooklying isometric adductor ball squeeze 5s hold x 10; Sit to stand without UE supportfrom mat tableat lowest heightwith 7# dumbell in each handgoblet squat position 2x 10, 10# medicine ball x 10; Quantum L single leg press 105# x 10, x 15; Cold pack applied to left hipat end of session (unbilled);  Manual Therapy Gentle long axis distraction withbelt assist, added heat to L anterior hip/hip flexors to minimize tone and decrease pain. Pt gradually progressed into more and more extension by raising the table height during distraction; L hip AP mobs at  neutral, grade 3, 30s/bout x 3bouts; MET hip extension stretch 5s hold, 5s stretch x 3; Patient needs vc for correct form and technique of exercises                     PT Education - 04/28/18 1539    Education provided  Yes    Education Details  exercise form/technique    Person(s) Educated  Patient    Methods  Explanation    Comprehension  Verbalized understanding       PT Short Term Goals - 04/19/18 1038      PT SHORT TERM GOAL #1   Title  Patient will increase BLE gross strength to 4+/5 as to improve functional strength for independent gait, increased standing tolerance and increased ADL ability.    Baseline  04/19/18: not tested     Time  6    Period  Weeks    Status  Deferred      PT SHORT TERM GOAL #2   Title  Patient will be able to perform household work/ chores without increase in symptoms.    Baseline  04/19/18: pain increases but able to perform    Time  6    Period  Weeks    Status  Partially Met    Target Date  04/20/18        PT Long Term Goals - 04/19/18 1039      PT LONG TERM GOAL #1   Title  Patient will be independent in home exercise program to improve strength/mobility for better functional independence with ADLs.    Time  12    Status  On-going    Target Date  06/01/18      PT LONG TERM GOAL #2   Title  Patient will increase six minute walk test distance to >1000 for progression to community ambulator and improve gait ability    Baseline  03/09/18: 530'; 04/19/18: 1050'    Time  12    Period  Weeks    Status  Achieved    Target Date  06/01/18      PT LONG TERM GOAL #3   Title  Patient will increase 10 meter walk test to >1.48ms as to improve gait speed for better community ambulation and to reduce fall risk.    Baseline  03/09/09: 0.47 m/s; 04/19/18:  self-selected: 10.1s = 0.99 m/s fastest: 9.1s = 1.1 m/s  Time  12    Period  Weeks    Status  Partially Met    Target Date  06/01/18      PT LONG TERM GOAL #4   Title  Patient  will reduce timed up and go to <11 seconds to reduce fall risk and demonstrate improved transfer/gait ability.    Baseline  03/09/18: 24.61s; 04/19/18: 12.5s    Time  12    Period  Weeks    Status  Partially Met    Target Date  06/01/18      PT LONG TERM GOAL #5   Title  Patient will report a worst pain of 3/10 on VAS in  left hip           to improve tolerance with ADLs and reduced symptoms with activities.     Baseline  04/19/18: 7-8/10    Time  12    Period  Weeks    Status  On-going    Target Date  06/01/18            Plan - 04/28/18 1541    Clinical Impression Statement  Pt denies any increase in pain during session but is clearly upset by the recent update from his orthopedist. He continues to progress faster to neutral L hip extension. Less time spent with stretching and manual therapy and insteady devoted more time to strengthening today. Called orthopedist to request copy of his office visit note. Pt encouraged to continue his HEP and follow-up as scheduled. Pt will benefit from PT services to address deficits in strength, balance, and mobility in order to return to full function at home.     Rehab Potential  Good    PT Frequency  2x / week    PT Duration  12 weeks    PT Treatment/Interventions  Cryotherapy;Electrical Stimulation;Moist Heat;Gait training;Ultrasound;Stair training;Functional mobility training;Therapeutic exercise;Therapeutic activities;Balance training;Neuromuscular re-education;Patient/family education;Manual techniques;Passive range of motion;Dry needling    PT Next Visit Plan  manual therapy, therapeutic exercise    PT Home Exercise Plan  heat, heel raises standing , hip abd, prone positioning, hooklying bridges    Consulted and Agree with Plan of Care  Patient       Patient will benefit from skilled therapeutic intervention in order to improve the following deficits and impairments:  Abnormal gait, Decreased endurance, Decreased mobility, Difficulty walking,  Pain, Impaired flexibility, Decreased strength, Decreased safety awareness, Decreased activity tolerance  Visit Diagnosis: Difficulty in walking, not elsewhere classified  Muscle weakness (generalized)  Stiffness of left hip, not elsewhere classified     Problem List Patient Active Problem List   Diagnosis Date Noted  . 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 Osteoarthritis of left hip 02/09/2018  . Constipation   . Change in bowel habits   . Chest pain 05/12/2016  . Strain of rectus abdominis muscle 08/01/2015   Phillips Grout PT, DPT   Noel Henandez 04/28/2018, 11:05 PM  [place] MAIN [facility] SERVICES [address] [place], [place], [address] Phone: [phone]   Fax:  [phone]  Name: PETAR MUCCI MRN: [id number] Date of Birth: Oct 16, 1941

## 2018-05-04 ENCOUNTER — Ambulatory Visit: Payer: Medicare HMO

## 2018-05-04 DIAGNOSIS — R262 Difficulty in walking, not elsewhere classified: Secondary | ICD-10-CM | POA: Diagnosis not present

## 2018-05-04 DIAGNOSIS — M6281 Muscle weakness (generalized): Secondary | ICD-10-CM

## 2018-05-04 NOTE — Therapy (Signed)
[place] MAIN [facility] SERVICES [address] [place], [place], [address] Phone: [phone]   Fax:  [phone]  Physical Therapy Treatment  Patient Details  Name: Harold Sandoval MRN: [id number] Date of Birth: 1941/10/26 Referring Provider: Frederik Pear   Encounter Date: 05/04/2018  PT End of Session - 05/04/18 1541    Visit Number  14    Number of Visits  25    Date for PT Re-Evaluation  06/01/18    Authorization Type  progress note 4/10    Authorization Time Period  Last goals 04/19/18    PT Start Time  1520    PT Stop Time  1600    PT Time Calculation (min)  40 min    Equipment Utilized During Treatment  Gait belt    Activity Tolerance  Patient tolerated treatment well    Behavior During Therapy  [facility] for tasks assessed/performed       Past Medical History:  Diagnosis Date  . Arthritis    "all over" (02/10/2018)  . Balance problem    when first stands.  Since stroke.  . Glaucoma, both eyes   . High cholesterol   . History of gout   . 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 Macular degeneration, left eye   . Sinus headache   . Stroke ([place]) 11/24/2015   denies residual on 02/10/2018  . Type II diabetes mellitus ([place])     Past Surgical History:  Procedure Laterality Date  . APPENDECTOMY    . BACK SURGERY    . CATARACT EXTRACTION, BILATERAL    . COLONOSCOPY WITH PROPOFOL N/A 12/29/2016   Procedure: COLONOSCOPY WITH PROPOFOL;  Surgeon: Lucilla Lame, MD;  Location: [place];  Service: Endoscopy;  Laterality: N/A;  Diabetic - oral meds  . JOINT REPLACEMENT    . LAPAROSCOPIC CHOLECYSTECTOMY    . LOOP RECORDER INSERTION N/A 02/24/2017   Procedure: Loop Recorder Insertion;  Surgeon: Isaias Cowman, MD;  Location: [place] CV LAB;  Service: Cardiovascular;  Laterality: N/A;  . LUMBAR [place]    . 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  Subjective Assessment - 05/04/18 1525    Subjective  Pt denies L hip pain upon arrival but reports 4/10 L thigh and knee pain. He states that he has not been able to fill his medication from the MD because the pharmacy is awaiting a return call. No specific questions or concerns at this time.    Pertinent History  Patinet had several cortisone shots to left hip and in left knee. His surgery was 02/10/18 at  and was in the hospital and then he went home 02/12/18.  He was using  a RW and had HHPT 2 x / week. He now is using a SPT.     Limitations  Walking;Standing    How long can you sit comfortably?  as long as he wants to    How long can you stand comfortably?   5 mins     How long can you walk comfortably?  5 mins    Patient Stated Goals  Patient wants to be able to walk without the can and mow his lawn and weed it.     Currently in  Pain?  Yes    Pain Score  4     Pain Location  Knee    Pain Orientation  Left    Pain Descriptors / Indicators  Aching    Pain Type  Chronic pain    Pain Onset  More than a month ago         TREATMENT  Ther-ex R sidelying L SLR abduction 2 x 15; Hooklying bridges 2 x 15; Hooklying L SLR 2 x 15; Sit to stand without UE supportfrom mat tableat lowest heightwith 8# dumbell in each hand 2 x 15; QuantumLsingle leg press 110# 2 x 15; Cold pack applied to left hipat end of session (unbilled);  Manual Therapy Gentle long axis distraction withbelt assist, added heat to L anterior hip/hip flexors to minimize tone and decrease pain. Pt gradually progressed into more and more extension by raising the table height during distraction; L hip AP mobs at neutral, grade 3, 30s/bout x 3bouts; MET hip extension stretch 5s hold, 5s stretch x 3; Patient needs vc for correct form and technique of  exercises                        PT Education - 05/04/18 1541    Education provided  Yes    Education Details  exercise form/technique, plan of care    Person(s) Educated  Patient    Methods  Explanation    Comprehension  Verbalized understanding       PT Short Term Goals - 04/19/18 1038      PT SHORT TERM GOAL #1   Title  Patient will increase BLE gross strength to 4+/5 as to improve functional strength for independent gait, increased standing tolerance and increased ADL ability.    Baseline  04/19/18: not tested     Time  6    Period  Weeks    Status  Deferred      PT SHORT TERM GOAL #2   Title  Patient will be able to perform household work/ chores without increase in symptoms.    Baseline  04/19/18: pain increases but able to perform    Time  6    Period  Weeks    Status  Partially Met    Target Date  04/20/18        PT Long Term Goals - 04/19/18 1039      PT LONG TERM GOAL #1   Title  Patient will be independent in home exercise program to improve strength/mobility for better functional independence with ADLs.    Time  12    Status  On-going    Target Date  06/01/18      PT LONG TERM GOAL #2   Title  Patient will increase six minute walk test distance to >1000 for progression to community ambulator and improve gait ability    Baseline  03/09/18: 530'; 04/19/18: 1050'    Time  12    Period  Weeks    Status  Achieved    Target Date  06/01/18      PT LONG TERM GOAL #3   Title  Patient will increase 10 meter walk test to >1.98ms as to improve gait speed for better community ambulation and to reduce fall risk.    Baseline  03/09/09: 0.47 m/s; 04/19/18:  self-selected: 10.1s = 0.99 m/s fastest: 9.1s = 1.1 m/s    Time  12    Period  Weeks    Status  Partially Met    Target Date  06/01/18      PT LONG TERM GOAL #4   Title  Patient will reduce timed up and go to <11 seconds to reduce fall risk and demonstrate improved transfer/gait ability.     Baseline  03/09/18: 24.61s; 04/19/18: 12.5s    Time  12    Period  Weeks    Status  Partially Met    Target Date  06/01/18      PT LONG TERM GOAL #5   Title  Patient will report a worst pain of 3/10 on VAS in  left hip           to improve tolerance with ADLs and reduced symptoms with activities.     Baseline  04/19/18: 7-8/10    Time  12    Period  Weeks    Status  On-going    Target Date  06/01/18            Plan - 05/04/18 1541    Clinical Impression Statement  Pt denies any increase in pain during session and demonstrates better motivation today. He demonstrates improved hip abduction strength today and continues to get closer to neutral hip extension faster during each session. Pt encouraged to continue his HEP and follow-up as scheduled. Pt will benefit from PT services to address deficits in strength, balance, and mobility in order to return to full function at home.    Rehab Potential  Good    PT Frequency  2x / week    PT Duration  12 weeks    PT Treatment/Interventions  Cryotherapy;Electrical Stimulation;Moist Heat;Gait training;Ultrasound;Stair training;Functional mobility training;Therapeutic exercise;Therapeutic activities;Balance training;Neuromuscular re-education;Patient/family education;Manual techniques;Passive range of motion;Dry needling    PT Next Visit Plan  manual therapy, therapeutic exercise    PT Home Exercise Plan  heat, heel raises standing , hip abd, prone positioning, hooklying bridges    Consulted and Agree with Plan of Care  Patient       Patient will benefit from skilled therapeutic intervention in order to improve the following deficits and impairments:  Abnormal gait, Decreased endurance, Decreased mobility, Difficulty walking, Pain, Impaired flexibility, Decreased strength, Decreased safety awareness, Decreased activity tolerance  Visit Diagnosis: Difficulty in walking, not elsewhere classified  Muscle weakness (generalized)     Problem  List Patient Active Problem List   Diagnosis Date Noted  . 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 Osteoarthritis of left hip 02/09/2018  . Constipation   . Change in bowel habits   . Chest pain 05/12/2016  . Strain of rectus abdominis muscle 08/01/2015   Phillips Grout PT, DPT   Huprich,Jason 05/05/2018, 9:30 AM  [place] MAIN [facility] SERVICES [address] [place], [place], [address] Phone: [phone]   Fax:  [phone]  Name: Harold Sandoval MRN: [id number] Date of Birth: 1941-06-24

## 2018-05-06 ENCOUNTER — Ambulatory Visit: Payer: Medicare HMO

## 2018-05-06 DIAGNOSIS — R262 Difficulty in walking, not elsewhere classified: Secondary | ICD-10-CM | POA: Diagnosis not present

## 2018-05-06 DIAGNOSIS — M25652 Stiffness of left hip, not elsewhere classified: Secondary | ICD-10-CM

## 2018-05-06 DIAGNOSIS — M6281 Muscle weakness (generalized): Secondary | ICD-10-CM

## 2018-05-06 NOTE — Therapy (Signed)
[place] MAIN [facility] SERVICES [address] [place], [place], [address] Phone: [phone]   Fax:  [phone]  Physical Therapy Treatment  Patient Details  Name: Harold Sandoval MRN: [id number] Date of Birth: 13-Jan-1941 Referring Provider: Frederik Pear   Encounter Date: 05/06/2018  PT End of Session - 05/06/18 1540    Visit Number  15    Number of Visits  25    Date for PT Re-Evaluation  06/01/18    Authorization Type  progress note 5/10    Authorization Time Period  Last goals 04/19/18    PT Start Time  1517    PT Stop Time  1600    PT Time Calculation (min)  43 min    Equipment Utilized During Treatment  Gait belt    Activity Tolerance  Patient tolerated treatment well    Behavior During Therapy  [facility] for tasks assessed/performed       Past Medical History:  Diagnosis Date  . Arthritis    "all over" (02/10/2018)  . Balance problem    when first stands.  Since stroke.  . Glaucoma, both eyes   . High cholesterol   . History of gout   . 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 Macular degeneration, left eye   . Sinus headache   . Stroke ([place]) 11/24/2015   denies residual on 02/10/2018  . Type II diabetes mellitus ([place])     Past Surgical History:  Procedure Laterality Date  . APPENDECTOMY    . BACK SURGERY    . CATARACT EXTRACTION, BILATERAL    . COLONOSCOPY WITH PROPOFOL N/A 12/29/2016   Procedure: COLONOSCOPY WITH PROPOFOL;  Surgeon: Lucilla Lame, MD;  Location: [place];  Service: Endoscopy;  Laterality: N/A;  Diabetic - oral meds  . JOINT REPLACEMENT    . LAPAROSCOPIC CHOLECYSTECTOMY    . LOOP RECORDER INSERTION N/A 02/24/2017   Procedure: Loop Recorder Insertion;  Surgeon: Isaias Cowman, MD;  Location: [place] CV LAB;  Service: Cardiovascular;  Laterality: N/A;  . LUMBAR [place]    . 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  Subjective Assessment - 05/06/18 1538    Subjective  Pt denies L hip or knee pain upon arrival. He was able to fill the medication prescribed by his orthopedist but pt doesn't remember the name of the medication. No specific questions or concerns at this time.     Pertinent History  Patinet had several cortisone shots to left hip and in left knee. His surgery was 02/10/18 at [place] and was in the hospital and then he went home 02/12/18.  He was using  a RW and had HHPT 2 x / week. He now is using a SPT.     Limitations  Walking;Standing    How long can you sit comfortably?  as long as he wants to    How long can you stand comfortably?   5 mins     How long can you walk comfortably?  5 mins    Patient Stated Goals  Patient wants to be able to walk without the can and mow his lawn and weed it.     Currently in Pain?  No/denies  TREATMENT  Ther-ex Hooklying bridges 2 x 15; Hooklying clams against belt 5s hold 2 x 15; Hooklying adductor ball squeeze 5s hold 2 x 15; R sidelying L SLR abduction 2 x 15; Hooklying L SLR 2 x 15; Sit to stand without UE supportfrom mat tableat lowest heightwith 8# dumbell ineach hand 2 x 15; QuantumLsingle leg press 120# 2 x 15; Cold pack applied to left hipat end of session (unbilled);  Manual Therapy Gentle long axis distraction withbelt assist, added heat to L anterior hip/hip flexors to minimize tone and decrease pain. Pt gradually progressed into more and more extension by raising the table height during distraction; L hip AP mobs at neutral, grade 3, 30s/bout x 3bouts; MET hip extension stretch 5s hold, 5s stretch x 3; Patient needs vc for correct form and technique of exercises                      PT Education - 05/06/18 1539    Education provided  Yes     Education Details  exercise form/technique    Person(s) Educated  Patient    Methods  Explanation    Comprehension  Verbalized understanding       PT Short Term Goals - 04/19/18 1038      PT SHORT TERM GOAL #1   Title  Patient will increase BLE gross strength to 4+/5 as to improve functional strength for independent gait, increased standing tolerance and increased ADL ability.    Baseline  04/19/18: not tested     Time  6    Period  Weeks    Status  Deferred      PT SHORT TERM GOAL #2   Title  Patient will be able to perform household work/ chores without increase in symptoms.    Baseline  04/19/18: pain increases but able to perform    Time  6    Period  Weeks    Status  Partially Met    Target Date  04/20/18        PT Long Term Goals - 04/19/18 1039      PT LONG TERM GOAL #1   Title  Patient will be independent in home exercise program to improve strength/mobility for better functional independence with ADLs.    Time  12    Status  On-going    Target Date  06/01/18      PT LONG TERM GOAL #2   Title  Patient will increase six minute walk test distance to >1000 for progression to community ambulator and improve gait ability    Baseline  03/09/18: 530'; 04/19/18: 1050'    Time  12    Period  Weeks    Status  Achieved    Target Date  06/01/18      PT LONG TERM GOAL #3   Title  Patient will increase 10 meter walk test to >1.28ms as to improve gait speed for better community ambulation and to reduce fall risk.    Baseline  03/09/09: 0.47 m/s; 04/19/18:  self-selected: 10.1s = 0.99 m/s fastest: 9.1s = 1.1 m/s    Time  12    Period  Weeks    Status  Partially Met    Target Date  06/01/18      PT LONG TERM GOAL #4   Title  Patient will reduce timed up and go to <11 seconds to reduce fall risk and demonstrate improved transfer/gait ability.    Baseline  03/09/18: 24.61s;  04/19/18: 12.5s    Time  12    Period  Weeks    Status  Partially Met    Target Date  06/01/18      PT LONG  TERM GOAL #5   Title  Patient will report a worst pain of 3/10 on VAS in  left hip           to improve tolerance with ADLs and reduced symptoms with activities.     Baseline  04/19/18: 7-8/10    Time  12    Period  Weeks    Status  On-going    Target Date  06/01/18            Plan - 05/06/18 1540    Clinical Impression Statement  Pt denies any increase in pain during session and reports feeling better since starting his medication. Focused more on strengthening today. Pt encouraged to continue his HEP and follow-up as scheduled. Pt will benefit from PT services to address deficits in strength, balance, and mobility in order to return to full function at home    Rehab Potential  Good    PT Frequency  2x / week    PT Duration  12 weeks    PT Treatment/Interventions  Cryotherapy;Electrical Stimulation;Moist Heat;Gait training;Ultrasound;Stair training;Functional mobility training;Therapeutic exercise;Therapeutic activities;Balance training;Neuromuscular re-education;Patient/family education;Manual techniques;Passive range of motion;Dry needling    PT Next Visit Plan  manual therapy, therapeutic exercise, hope to discharge at end of current cert 3/87/56 or sooner pending progress    PT Home Exercise Plan  heat, heel raises standing , hip abd, prone positioning, hooklying bridges    Consulted and Agree with Plan of Care  Patient       Patient will benefit from skilled therapeutic intervention in order to improve the following deficits and impairments:  Abnormal gait, Decreased endurance, Decreased mobility, Difficulty walking, Pain, Impaired flexibility, Decreased strength, Decreased safety awareness, Decreased activity tolerance  Visit Diagnosis: Difficulty in walking, not elsewhere classified  Muscle weakness (generalized)  Stiffness of left hip, not elsewhere classified     Problem List Patient Active Problem List   Diagnosis Date Noted  . Primary osteoarthritis of left hip  02/10/2018  . Osteoarthritis of left hip 02/09/2018  . Constipation   . Change in bowel habits   . Chest pain 05/12/2016  . Strain of rectus abdominis muscle 08/01/2015   Phillips Grout PT, DPT   Shameika Speelman 05/07/2018, 5:14 PM  [place] MAIN [facility] SERVICES [address] [place], [place], [address] Phone: [phone]   Fax:  [phone]  Name: Harold Sandoval MRN: [id number] Date of Birth: [birth year]

## 2018-05-11 ENCOUNTER — Ambulatory Visit: Payer: Medicare HMO | Attending: Orthopedic Surgery | Admitting: Physical Therapy

## 2018-05-11 ENCOUNTER — Encounter: Payer: Self-pay | Admitting: Physical Therapy

## 2018-05-11 DIAGNOSIS — R262 Difficulty in walking, not elsewhere classified: Secondary | ICD-10-CM

## 2018-05-11 DIAGNOSIS — M25652 Stiffness of left hip, not elsewhere classified: Secondary | ICD-10-CM | POA: Diagnosis present

## 2018-05-11 DIAGNOSIS — M6281 Muscle weakness (generalized): Secondary | ICD-10-CM | POA: Insufficient documentation

## 2018-05-11 NOTE — Therapy (Addendum)
[place] MAIN [facility] SERVICES [address] [place], [place], [address] Phone: [phone]   Fax:  [phone]  Physical Therapy Treatment/ discharge Summary  Patient Details  Name: Harold Sandoval MRN: [id number] Date of Birth: 1941-10-08 Referring Provider: Frederik Pear   Encounter Date: 05/11/2018  PT End of Session - 05/11/18 0928    Visit Number  16    Number of Visits  25    Date for PT Re-Evaluation  06/01/18    Authorization Type  progress note 6/10    Authorization Time Period  Last goals 04/19/18    PT Start Time  0925    PT Stop Time  1005    PT Time Calculation (min)  40 min    Equipment Utilized During Treatment  Gait belt    Activity Tolerance  Patient tolerated treatment well    Behavior During Therapy  [facility] for tasks assessed/performed       Past Medical History:  Diagnosis Date  . Arthritis    "all over" (02/10/2018)  . Balance problem    when first stands.  Since stroke.  . Glaucoma, both eyes   . High cholesterol   . History of gout   . 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 Macular degeneration, left eye   . Sinus headache   . Stroke ([place]) 11/24/2015   denies residual on 02/10/2018  . Type II diabetes mellitus ([place])     Past Surgical History:  Procedure Laterality Date  . APPENDECTOMY    . BACK SURGERY    . CATARACT EXTRACTION, BILATERAL    . COLONOSCOPY WITH PROPOFOL N/A 12/29/2016   Procedure: COLONOSCOPY WITH PROPOFOL;  Surgeon: Lucilla Lame, MD;  Location: [place];  Service: Endoscopy;  Laterality: N/A;  Diabetic - oral meds  . JOINT REPLACEMENT    . LAPAROSCOPIC CHOLECYSTECTOMY    . LOOP RECORDER INSERTION N/A 02/24/2017   Procedure: Loop Recorder Insertion;  Surgeon: Isaias Cowman, MD;  Location: [place] CV LAB;  Service: Cardiovascular;  Laterality: N/A;  . LUMBAR [place]    . 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  Subjective Assessment - 05/11/18 0926    Subjective  Pt denies L hip or knee pain upon arrival.  No specific questions or concerns at this time. He is taking medicine for HO.    Pertinent History  Patinet had several cortisone shots to left hip and in left knee. His surgery was 02/10/18 at [place] and was in the hospital and then he went home 02/12/18.  He was using  a RW and had HHPT 2 x / week. He now is using a SPT.     Limitations  Walking;Standing    How long can you sit comfortably?  as long as he wants to    How long can you stand comfortably?   5 mins     How long can you walk comfortably?  5 mins    Patient Stated Goals  Patient wants to be able to walk without the can and mow his lawn and weed it.     Currently in Pain?  No/denies    Pain Score  0-No pain    Pain Onset  More than a  month ago         Ther-ex Octane fitness x 5 mins TM 1. 8 miles / hour , no elevation x 10 mins Standing with RTB hip flex x 20 BLE  Heel raises x 20 Squats with 5 sec hold x 15 , cues to keep shoulders back Wall squats with 5 sec x 15 ,cues to count to 5 because he does not hold it long enough Patient did not perform L hip abd or L hip ext due to anterior hip precautions Standing with RTB hip abd x 20  Standing with RTB hip ext x 20  Hooklying bridges 2 x 15; QuantumLsingle leg press 120#2x15; Heel raise with leg press 60 lbs x 20 x 2  Cold pack applied to left hipat end of session (unbilled);   CGA and Min  verbal cues used throughout. Patient performs intermediate level exercises without pain behaviors and needs verbal cuing for postural alignment. Patient has muscle fatigue after use of  Leg press and needs a rest period.                    PT Education - 05/11/18 [phone]    Education provided  Yes     Education Details  Patient is doing his HEP    Person(s) Educated  Patient    Methods  Explanation    Comprehension  Verbalized understanding       PT Short Term Goals - 04/19/18 1038      PT SHORT TERM GOAL #1   Title  Patient will increase BLE gross strength to 4+/5 as to improve functional strength for independent gait, increased standing tolerance and increased ADL ability.    Baseline  04/19/18: not tested     Time  6    Period  Weeks    Status  Deferred      PT SHORT TERM GOAL #2   Title  Patient will be able to perform household work/ chores without increase in symptoms.    Baseline  04/19/18: pain increases but able to perform    Time  6    Period  Weeks    Status  Partially Met    Target Date  04/20/18        PT Long Term Goals - 04/19/18 1039      PT LONG TERM GOAL #1   Title  Patient will be independent in home exercise program to improve strength/mobility for better functional independence with ADLs.    Time  12    Status  On-going    Target Date  06/01/18      PT LONG TERM GOAL #2   Title  Patient will increase six minute walk test distance to >1000 for progression to community ambulator and improve gait ability    Baseline  03/09/18: 530'; 04/19/18: 1050'    Time  12    Period  Weeks    Status  Achieved    Target Date  06/01/18      PT LONG TERM GOAL #3   Title  Patient will increase 10 meter walk test to >1.43ms as to improve gait speed for better community ambulation and to reduce fall risk.    Baseline  03/09/09: 0.47 m/s; 04/19/18:  self-selected: 10.1s = 0.99 m/s fastest: 9.1s = 1.1 m/s    Time  12    Period  Weeks    Status  Partially Met    Target Date  06/01/18  PT LONG TERM GOAL #4   Title  Patient will reduce timed up and go to <11 seconds to reduce fall risk and demonstrate improved transfer/gait ability.    Baseline  03/09/18: 24.61s; 04/19/18: 12.5s    Time  12    Period  Weeks    Status  Partially Met    Target Date  06/01/18      PT  LONG TERM GOAL #5   Title  Patient will report a worst pain of 3/10 on VAS in  left hip           to improve tolerance with ADLs and reduced symptoms with activities.     Baseline  04/19/18: 7-8/10    Time  12    Period  Weeks    Status  On-going    Target Date  06/01/18            Plan - 05/11/18 0928    Clinical Impression Statement  Patient required min verbal cueing during strengthening exercise to correct posture and form. Patient demonstrates ability to perform strengthening exercises with no pain but with moderate fatigue.Patient has achieved 2 goals and is very close to achieving the remaining 2 goals. Patient just began using the fitness gym and used the TM  and will be discharged from PT and willl continue his HEP at the gym .    Rehab Potential  Good    PT Frequency  2x / week    PT Duration  12 weeks    PT Treatment/Interventions  Cryotherapy;Electrical Stimulation;Moist Heat;Gait training;Ultrasound;Stair training;Functional mobility training;Therapeutic exercise;Therapeutic activities;Balance training;Neuromuscular re-education;Patient/family education;Manual techniques;Passive range of motion;Dry needling    PT Next Visit Plan  manual therapy, therapeutic exercise, hope to discharge at end of current cert 01/20/07 or sooner pending progress    PT Home Exercise Plan  heat, heel raises standing , hip abd, prone positioning, hooklying bridges    Consulted and Agree with Plan of Care  Patient       Patient will benefit from skilled therapeutic intervention in order to improve the following deficits and impairments:  Abnormal gait, Decreased endurance, Decreased mobility, Difficulty walking, Pain, Impaired flexibility, Decreased strength, Decreased safety awareness, Decreased activity tolerance  Visit Diagnosis: Difficulty in walking, not elsewhere classified  Muscle weakness (generalized)  Stiffness of left hip, not elsewhere classified     Problem List Patient Active  Problem List   Diagnosis Date Noted  . 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 Osteoarthritis of left hip 02/09/2018  . Constipation   . Change in bowel habits   . Chest pain 05/12/2016  . Strain of rectus abdominis muscle 08/01/2015    Alanson Puls, [place] DPT 05/11/2018, 9:30 AM  [place] MAIN [facility] SERVICES [address] [place] [address], [place], [address] Phone: [phone]   Fax:  [phone]  Name: Harold Sandoval MRN: [id number] Date of Birth: 1941-05-21

## 2018-05-13 ENCOUNTER — Encounter: Payer: PPO | Admitting: Physical Therapy

## 2018-05-17 ENCOUNTER — Encounter: Payer: Medicare HMO | Admitting: Physical Therapy

## 2018-05-19 ENCOUNTER — Encounter: Payer: Medicare HMO | Admitting: Physical Therapy

## 2018-05-25 ENCOUNTER — Encounter: Payer: Medicare HMO | Admitting: Physical Therapy

## 2018-05-27 ENCOUNTER — Encounter: Payer: Medicare HMO | Admitting: Physical Therapy

## 2018-06-01 ENCOUNTER — Encounter: Payer: Medicare HMO | Admitting: Physical Therapy

## 2018-06-20 IMAGING — XA DG FLUORO GUIDE NDL PLC/BX
1 series · 4 of 4 positions shown · IV contrast (isovue)
Comparison: None.

CLINICAL DATA: Left hip pain. Pain radiates to the thigh.
Occasional radiation below the knee with some numbness.

EXAM:
Fluoroscopically guided left hip injection
TECHNIQUE: The overlying skin was prepped with Betadine, draped in the usual
sterile fashion, and infiltrated locally with 1% Lidocaine. A 22
gauge spinal needle was advanced under fluoroscopic observation to
the lateral femoral neck. Confirmatory injection of less than 1 ml
of Isovue 200 demonstrates intra-articular spread without
intravascular component.
120 mg Depo-Medrol and 3 ml 1% lidocaine were then instilled. The
procedure was well tolerated. The patient was observed for a short
period of time then discharged in good condition.
FLUOROSCOPY TIME:  0 minutes 22 seconds. 36.26 micro gray meter
squared

[Series 2: ortho standard · 4 of 10 frames shown]
[frame 2/10]
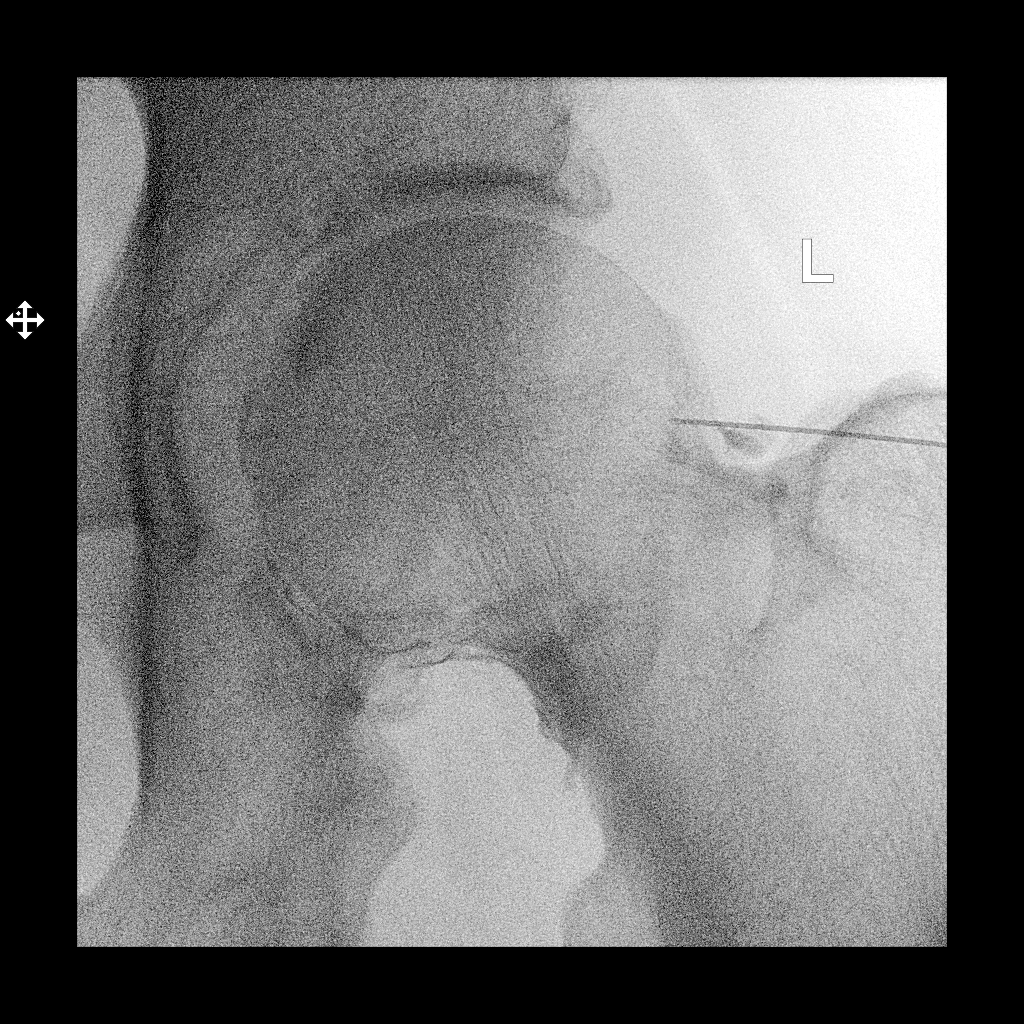
[frame 6/10]
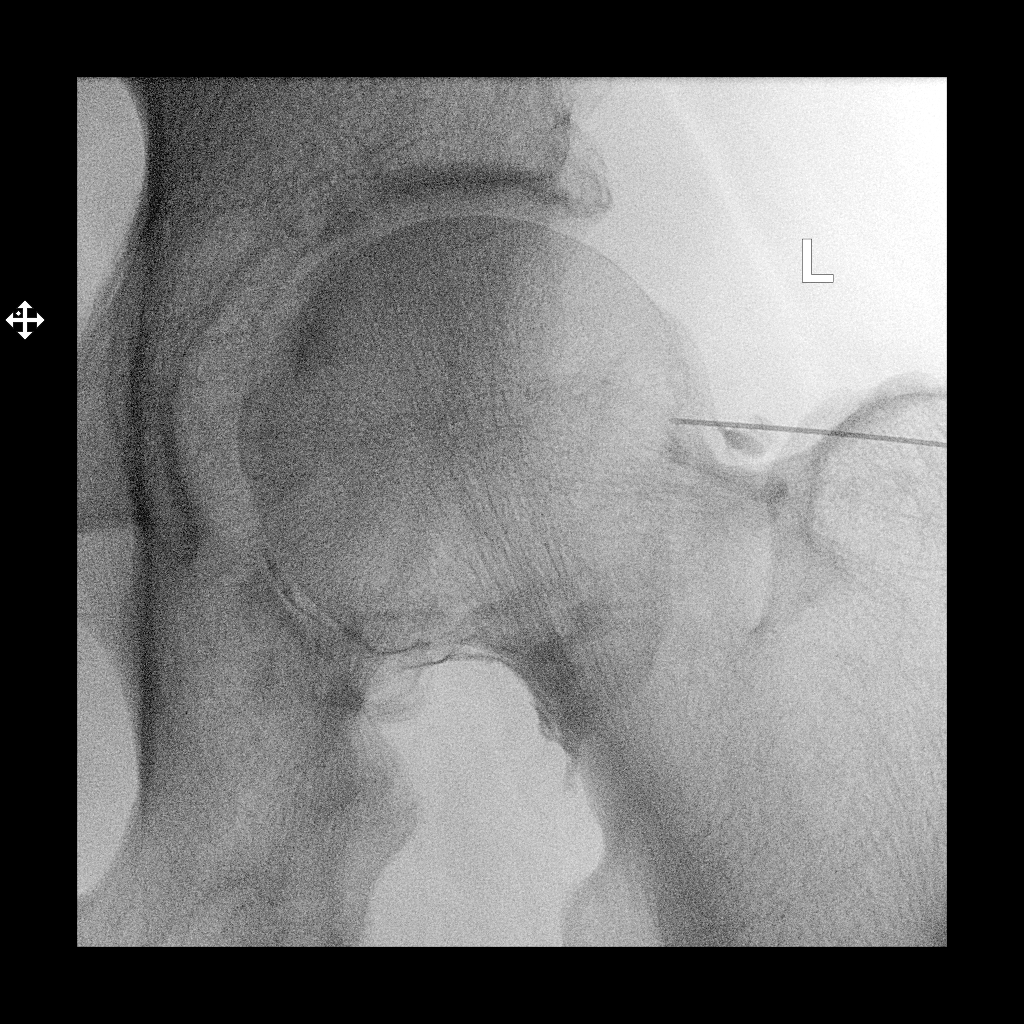
[frame 9/10]
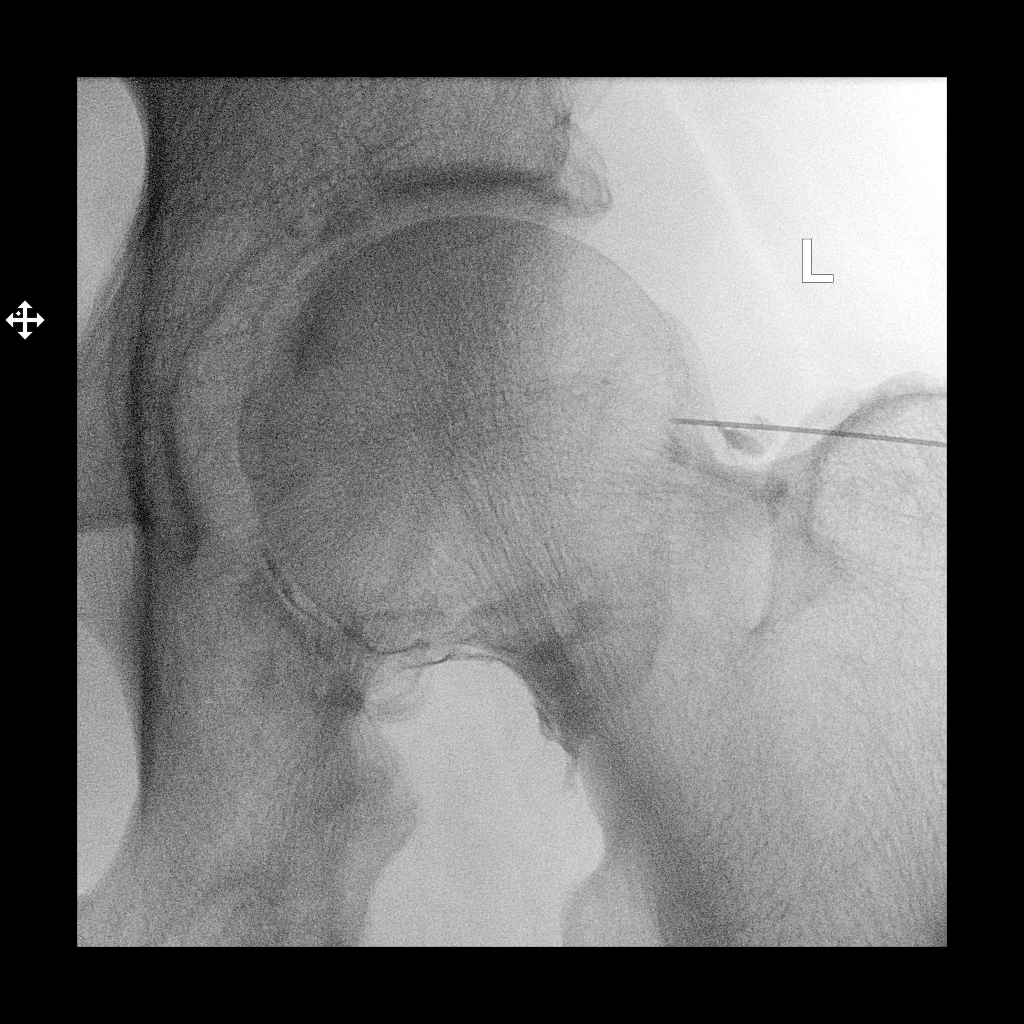
[frame 10/10]
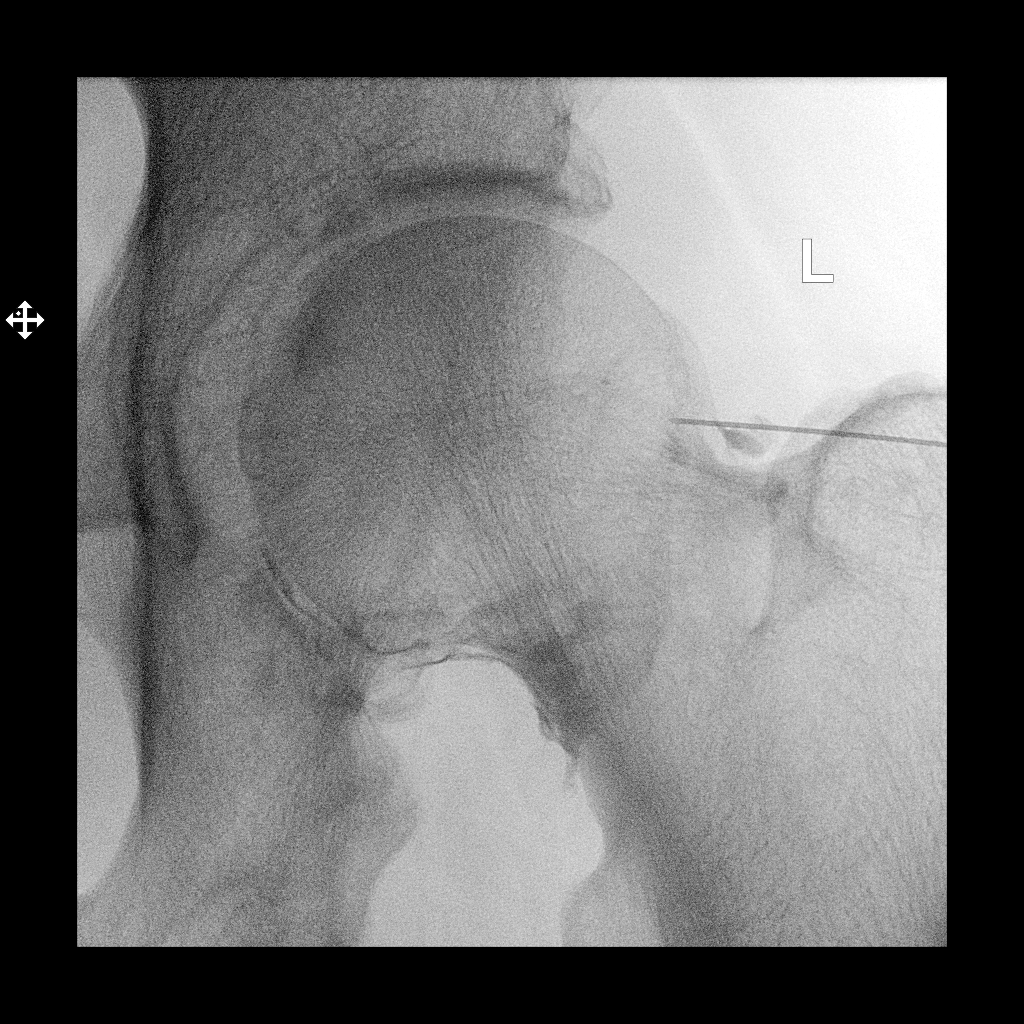

[4 of 4 positions shown; findings below may reference images not displayed]

IMPRESSION: Technically successful left hip injection under fluoroscopy.

Certain components of his pain syndrome, including pain radiating
below the knee and numbness are somewhat atypical of hip origin.
Consider spinal evaluation if this injection does not provide
benefit.

## 2018-08-03 ENCOUNTER — Other Ambulatory Visit: Payer: Self-pay | Admitting: Orthopedic Surgery

## 2018-08-03 ENCOUNTER — Encounter (HOSPITAL_COMMUNITY): Payer: Self-pay

## 2018-08-03 NOTE — Patient Instructions (Addendum)
Your procedure is scheduled on: Sept. 4, 2019   Surgery Time:  1:15PM-3:00PM   Report [facility] Main  Entrance    Report to admitting at 10:45 AM   Call this number if you have problems the morning of surgery [phone]   Do not eat food:After Midnight.   Do NOT smoke after Midnight   May have liquids until 7:00AM day of surgery      CLEAR LIQUID DIET   Foods Allowed                                                                     Foods Excluded  Coffee and tea, regular and decaf                             liquids that you cannot  Plain Jell-O in any flavor                                             see through such as: Fruit ices (not with fruit pulp)                                     milk, soups, orange juice  Iced Popsicles                                    All solid food Carbonated beverages, regular and diet                                    Cranberry, grape and apple juices Sports drinks like Gatorade Lightly seasoned clear broth or consume(fat free) Sugar, honey syrup  Sample Menu Breakfast                                Lunch                                     Supper Cranberry juice                    Beef broth                            Chicken broth Jell-O                                     Grape juice                           Apple juice Coffee or tea  Jell-O                                      Popsicle                                                Coffee or tea                        Coffee or tea   Take these medicines the morning of surgery with A SIP OF WATER:  Flonase if needed   Use eye drops per normal routine   DO NOT TAKE ANY DIABETIC MEDICATIONS DAY OF YOUR SURGERY                               You may not have any metal on your body including jewelry, and body piercings             Do not wear lotions, powders, perfumes/cologne, or deodorant                           Men may shave face  and neck.   Do not bring valuables to the hospital. [facility] IS NOT             RESPONSIBLE   FOR VALUABLES.   Contacts, dentures or bridgework may not be worn into surgery.   Leave suitcase in the car. After surgery it may be brought to your room.   Special Instructions: Bring a copy of your healthcare power of attorney and living will documents         the day of surgery if you haven't scanned them in before.              Please read over the following fact sheets you were given:  [facility] - Preparing for Surgery Before surgery, you can play an important role.  Because skin is not sterile, your skin needs to be as free of germs as possible.  You can reduce the number of germs on your skin by washing with CHG (chlorahexidine gluconate) soap before surgery.  CHG is an antiseptic cleaner which kills germs and bonds with the skin to continue killing germs even after washing. Please DO NOT use if you have an allergy to CHG or antibacterial soaps.  If your skin becomes reddened/irritated stop using the CHG and inform your nurse when you arrive at Short Stay. Do not shave (including legs and underarms) for at least 48 hours prior to the first CHG shower.  You may shave your face/neck.  Please follow these instructions carefully:  1.  Shower with CHG Soap the night before surgery and the  morning of surgery.  2.  If you choose to wash your hair, wash your hair first as usual with your normal  shampoo.  3.  After you shampoo, rinse your hair and body thoroughly to remove the shampoo.                             4.  Use CHG as you would any other liquid soap.  You can  apply chg directly to the skin and wash.  Gently with a scrungie or clean washcloth.  5.  Apply the CHG Soap to your body ONLY FROM THE NECK DOWN.   Do   not use on face/ open                           Wound or open sores. Avoid contact with eyes, ears mouth and   genitals (private parts).                       Wash face,   Genitals (private parts) with your normal soap.             6.  Wash thoroughly, paying special attention to the area where your    surgery  will be performed.  7.  Thoroughly rinse your body with warm water from the neck down.  8.  DO NOT shower/wash with your normal soap after using and rinsing off the CHG Soap.                9.  Pat yourself dry with a clean towel.            10.  Wear clean pajamas.            11.  Place clean sheets on your bed the night of your first shower and do not  sleep with pets. Day of Surgery : Do not apply any lotions/deodorants the morning of surgery.  Please wear clean clothes to the hospital/surgery center.  FAILURE TO FOLLOW THESE INSTRUCTIONS MAY RESULT IN THE CANCELLATION OF YOUR SURGERY  PATIENT SIGNATURE_________________________________  NURSE SIGNATURE__________________________________  ________________________________________________________________________

## 2018-08-03 NOTE — Pre-Procedure Instructions (Signed)
Last office visit note and cardiac clearance Dr. Lady GaryFath 02/05/2018 on chart and care everywhere.  CXR 02/03/2018 in epic.

## 2018-08-04 ENCOUNTER — Encounter (HOSPITAL_COMMUNITY): Payer: Self-pay

## 2018-08-04 ENCOUNTER — Encounter (HOSPITAL_COMMUNITY)
Admission: RE | Admit: 2018-08-04 | Discharge: 2018-08-04 | Disposition: A | Discharge status: Home / Self Care | Payer: Medicare HMO | Source: Ambulatory Visit | Attending: Orthopedic Surgery | Admitting: Orthopedic Surgery

## 2018-08-04 ENCOUNTER — Other Ambulatory Visit: Payer: Self-pay | Admitting: Orthopedic Surgery

## 2018-08-04 ENCOUNTER — Other Ambulatory Visit: Payer: Self-pay

## 2018-08-04 DIAGNOSIS — Z7982 Long term (current) use of aspirin: Secondary | ICD-10-CM | POA: Insufficient documentation

## 2018-08-04 DIAGNOSIS — M1611 Unilateral primary osteoarthritis, right hip: Secondary | ICD-10-CM | POA: Insufficient documentation

## 2018-08-04 DIAGNOSIS — Z7984 Long term (current) use of oral hypoglycemic drugs: Secondary | ICD-10-CM | POA: Insufficient documentation

## 2018-08-04 DIAGNOSIS — E1140 Type 2 diabetes mellitus with diabetic neuropathy, unspecified: Secondary | ICD-10-CM | POA: Diagnosis not present

## 2018-08-04 DIAGNOSIS — Z7901 Long term (current) use of anticoagulants: Secondary | ICD-10-CM | POA: Insufficient documentation

## 2018-08-04 DIAGNOSIS — Z01818 Encounter for other preprocedural examination: Secondary | ICD-10-CM | POA: Insufficient documentation

## 2018-08-04 DIAGNOSIS — E7800 Pure hypercholesterolemia, unspecified: Secondary | ICD-10-CM | POA: Insufficient documentation

## 2018-08-04 DIAGNOSIS — Z79899 Other long term (current) drug therapy: Secondary | ICD-10-CM | POA: Diagnosis not present

## 2018-08-04 DIAGNOSIS — Z01810 Encounter for preprocedural cardiovascular examination: Secondary | ICD-10-CM | POA: Insufficient documentation

## 2018-08-04 HISTORY — DX: Osteoarthritis of hip, unspecified: M16.9

## 2018-08-04 HISTORY — DX: Type 2 diabetes mellitus with diabetic neuropathy, unspecified: E11.40

## 2018-08-04 HISTORY — DX: Constipation, unspecified: K59.00

## 2018-08-04 HISTORY — DX: Cerebral infarction, unspecified: I63.9

## 2018-08-04 HISTORY — DX: Diverticulosis of large intestine without perforation or abscess without bleeding: K57.30

## 2018-08-04 LAB — CBC WITH DIFFERENTIAL/PLATELET
Basophils Absolute: 0 10*3/uL (ref 0.0–0.1)
Basophils Relative: 1 %
Eosinophils Absolute: 0 10*3/uL (ref 0.0–0.7)
Eosinophils Relative: 1 %
HCT: 44.3 % (ref 39.0–52.0)
Hemoglobin: 14.7 g/dL (ref 13.0–17.0)
Lymphocytes Relative: 24 %
Lymphs Abs: 1.4 10*3/uL (ref 0.7–4.0)
MCH: 29.4 pg (ref 26.0–34.0)
MCHC: 33.2 g/dL (ref 30.0–36.0)
MCV: 88.6 fL (ref 78.0–100.0)
Monocytes Absolute: 0.3 10*3/uL (ref 0.1–1.0)
Monocytes Relative: 5 %
Neutro Abs: 4 10*3/uL (ref 1.7–7.7)
Neutrophils Relative %: 69 %
Platelets: 177 10*3/uL (ref 150–400)
RBC: 5 MIL/uL (ref 4.22–5.81)
RDW: 12.6 % (ref 11.5–15.5)
WBC: 5.7 10*3/uL (ref 4.0–10.5)

## 2018-08-04 LAB — BASIC METABOLIC PANEL WITH GFR
Anion gap: 8 (ref 5–15)
BUN: 21 mg/dL (ref 8–23)
CO2: 30 mmol/L (ref 22–32)
Calcium: 9.6 mg/dL (ref 8.9–10.3)
Chloride: 106 mmol/L (ref 98–111)
Creatinine, Ser: 0.92 mg/dL (ref 0.61–1.24)
GFR calc Af Amer: >60 mL/min
GFR calc non Af Amer: >60 mL/min
Glucose, Bld: 121 mg/dL — ABNORMAL HIGH (ref 70–99)
Potassium: 4.1 mmol/L (ref 3.5–5.1)
Sodium: 144 mmol/L (ref 135–145)

## 2018-08-04 LAB — ABO/RH: ABO/RH(D): B POS

## 2018-08-04 LAB — APTT: aPTT: 30 s (ref 24–36)

## 2018-08-04 LAB — HEMOGLOBIN A1C
Hgb A1c MFr Bld: 6.7 % — ABNORMAL HIGH (ref 4.8–5.6)
Mean Plasma Glucose: 145.59 mg/dL

## 2018-08-04 LAB — SURGICAL PCR SCREEN
MRSA, PCR: NEGATIVE
Staphylococcus aureus: NEGATIVE

## 2018-08-04 LAB — PROTIME-INR
INR: 1.01
Prothrombin Time: 13.2 s (ref 11.4–15.2)

## 2018-08-04 LAB — GLUCOSE, CAPILLARY: Glucose-Capillary: 123 mg/dL — ABNORMAL HIGH (ref 70–99)

## 2018-08-04 NOTE — Pre-Procedure Instructions (Signed)
[name] states he is having a left knee replacement 08/11/2018 with Dr. Turner Danielsowan.   Left a message with Olegario MessierKathy at Dr. Wadie Lessenowan's office that I needed a new consent order and posting for the correct procedure.  No consent was able to be signed at pre op appointment today.

## 2018-08-04 NOTE — Pre-Procedure Instructions (Signed)
Olegario MessierKathy returned my call she will correct the orders and posting.

## 2018-08-04 NOTE — Pre-Procedure Instructions (Signed)
BMP and Hgb A1C results 08/04/2018 faxed to Dr. Turner Danielsowan via epic.

## 2018-08-06 ENCOUNTER — Other Ambulatory Visit: Payer: Self-pay | Admitting: Orthopedic Surgery

## 2018-08-06 NOTE — Care Plan (Signed)
Spoke with patient's SO prior to surgery. He will discharge to home with family and the following arrangements:   DME:  [facility]  HHPT:  [facility] - referral made     Contact numbers - [phone]  Fax # [phone]. Please fax discharge instructions to them at discharge.  OPPT:  [facility] 08/26/18 @ 1000  MD follow up with Dr Turner Danielsowan: TDB by the office.   Please contact Shauna HughRenee Angiulli, RNCM [phone] with questions or if this plan should need to change.

## 2018-08-09 ENCOUNTER — Emergency Department
Admission: EM | Admit: 2018-08-09 | Discharge: 2018-08-09 | Disposition: A | Discharge status: Home / Self Care | Payer: Medicare HMO | Attending: Emergency Medicine | Admitting: Emergency Medicine

## 2018-08-09 DIAGNOSIS — Z7984 Long term (current) use of oral hypoglycemic drugs: Secondary | ICD-10-CM | POA: Diagnosis not present

## 2018-08-09 DIAGNOSIS — S30860A Insect bite (nonvenomous) of lower back and pelvis, initial encounter: Secondary | ICD-10-CM | POA: Diagnosis present

## 2018-08-09 DIAGNOSIS — W57XXXA Bitten or stung by nonvenomous insect and other nonvenomous arthropods, initial encounter: Secondary | ICD-10-CM | POA: Insufficient documentation

## 2018-08-09 DIAGNOSIS — E119 Type 2 diabetes mellitus without complications: Secondary | ICD-10-CM | POA: Diagnosis not present

## 2018-08-09 DIAGNOSIS — Y92017 Garden or yard in single-family (private) house as the place of occurrence of the external cause: Secondary | ICD-10-CM | POA: Diagnosis not present

## 2018-08-09 DIAGNOSIS — Z7901 Long term (current) use of anticoagulants: Secondary | ICD-10-CM | POA: Insufficient documentation

## 2018-08-09 DIAGNOSIS — Z79899 Other long term (current) drug therapy: Secondary | ICD-10-CM | POA: Diagnosis not present

## 2018-08-09 DIAGNOSIS — Y93H2 Activity, gardening and landscaping: Secondary | ICD-10-CM | POA: Diagnosis not present

## 2018-08-09 DIAGNOSIS — Y999 Unspecified external cause status: Secondary | ICD-10-CM | POA: Diagnosis not present

## 2018-08-09 NOTE — ED Notes (Signed)
Hardcopy of DC signed. 

## 2018-08-09 NOTE — ED Provider Notes (Signed)
[facility] Emergency Department Provider Note ____________________________________________  Time seen: 1822  I have reviewed the triage vital signs and the nursing notes.  HISTORY  Chief Complaint  Insect Bite  HPI Harold Sandoval is a 78 y.o. male presents himself to the ED for evaluation of multiple insect bites. He [place], while cutting the grass with his riding mower. He recalls several bites down the back of his shirt, but did not see what bit him. He presents today with several inflamed, itchy bites. He denies any fevers, chills, or sweats. He was concerned about a spider bite.   Past Medical History:  Diagnosis Date  . Arthritis    "all over" (02/10/2018)  . Balance problem    when first stands.  Since stroke.  . Constipation   . 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 Degenerative joint disease (DJD) of hip   . Diabetic neuropathy (HCC)   . Diabetic neuropathy (HCC)    fingers  . 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 Glaucoma, both eyes   . High cholesterol   . History of gout   . 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 Macular degeneration, left eye   . Sinus headache    resolved  . Type II diabetes mellitus [facility])     Patient Active Problem List   Diagnosis Date Noted  . 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 Osteoarthritis of left hip 02/09/2018  . Constipation   . Change in bowel habits   . Chest pain 05/12/2016  . Strain of rectus abdominis muscle 08/01/2015    Past Surgical History:  Procedure Laterality Date  . APPENDECTOMY    . BACK SURGERY    . CATARACT EXTRACTION, BILATERAL    . COLONOSCOPY WITH PROPOFOL N/A 12/29/2016   Procedure: COLONOSCOPY WITH PROPOFOL;  Surgeon: Midge Minium, MD;  Location: [facility];  Service: Endoscopy;  Laterality:  N/A;  Diabetic - oral meds  . JOINT REPLACEMENT    . LAPAROSCOPIC CHOLECYSTECTOMY    . LOOP RECORDER INSERTION N/A 02/24/2017   Procedure: Loop Recorder Insertion;  Surgeon: Marcina Millard, MD;  Location: [facility] INVASIVE CV LAB;  Service: Cardiovascular;  Laterality: N/A;  . LUMBAR DISC SURGERY    . TOTAL HIP ARTHROPLASTY Left 02/10/2018   Procedure: TOTAL HIP ARTHROPLASTY ANTERIOR APPROACH;  Surgeon: Gean Birchwood, MD;  Location: [facility] OR;  Service: Orthopedics;  Laterality: Left;    Prior to Admission medications   Medication Sig Start Date End Date Taking? Authorizing Provider  acetaminophen (TYLENOL) 325 MG tablet Take 325-650 mg by mouth every 6 (six) hours as needed (FOR PAIN.).    [provider]  apixaban (ELIQUIS) 2.5 MG TABS tablet Take 1 tablet (2.5 mg total) by mouth 2 (two) times daily. Patient not taking: Reported on 07/28/2018 02/10/18   Allena Katz, PA-C  aspirin 81 MG chewable tablet Chew 81 mg by mouth daily.    [provider]  atorvastatin (LIPITOR) 80 MG tablet Take 80 mg by mouth every evening.     [provider]  brimonidine (ALPHAGAN) 0.2 % ophthalmic solution Place 1 drop into both eyes 2 (two) times daily.     [provider]  brinzolamide (AZOPT) 1 % ophthalmic suspension Place 1 drop into both eyes 2 (two) times daily.  [provider]  Cholecalciferol (VITAMIN D3) 2000 units capsule Take 2,000 Units by mouth daily.    [provider]  desonide (DESOWEN) 0.05 % lotion Apply 1 application topically 2 (two) times daily.    [provider]  fluticasone (FLONASE) 50 MCG/ACT nasal spray Place 2 sprays into both nostrils daily as needed for rhinitis.    [provider]  indomethacin (INDOCIN SR) 75 MG CR capsule Take 75 mg by mouth 2 (two) times daily as needed for pain. 07/06/18   [provider]  latanoprost (XALATAN) 0.005 % ophthalmic solution Place 1 drop into both eyes at bedtime.      [provider]  metFORMIN (GLUCOPHAGE-XR) 500 MG 24 hr tablet Take 500 mg by mouth 2 (two) times daily.     [provider]  oxyCODONE-acetaminophen (PERCOCET/ROXICET) 5-325 MG tablet Take 1 tablet by mouth every 4 (four) hours as needed for severe pain. Patient not taking: Reported on 07/28/2018 02/10/18   Allena Katz, PA-C  senna (SENOKOT) 8.6 MG tablet Take 2 tablets by mouth daily as needed for constipation.    [provider]  tiZANidine (ZANAFLEX) 2 MG tablet Take 1 tablet (2 mg total) by mouth every 6 (six) hours as needed for muscle spasms. Patient not taking: Reported on 07/28/2018 02/10/18   Allena Katz, PA-C  traMADol (ULTRAM) 50 MG tablet Take 50 mg by mouth every 4 (four) hours as needed for pain. 05/28/18   [provider]    Allergies Patient has no known allergies.  Family History  Problem Relation Age of Onset  . Cancer Mother   . Cancer Father     Social History Social History   Tobacco Use  . Smoking status: Never Smoker  . Smokeless tobacco: Never Used  Substance Use Topics  . Alcohol use: No    Alcohol/week: 0.0 standard drinks  . Drug use: No    Review of Systems  Constitutional: Negative for fever. Eyes: Negative for visual changes. ENT: Negative for sore throat. Cardiovascular: Negative for chest pain. Respiratory: Negative for shortness of breath. Musculoskeletal: Negative for back pain. Skin: Negative for rash. Multiple insect bites Neurological: Negative for headaches, focal weakness or numbness. ____________________________________________  PHYSICAL EXAM:  VITAL SIGNS: ED Triage Vitals  Enc Vitals Group     BP 08/09/18 1726 (!) 174/88     Pulse Rate 08/09/18 1726 87     Resp 08/09/18 1726 18     Temp 08/09/18 1726 98.1 F (36.7 C)     Temp Source 08/09/18 1726 Oral     SpO2 08/09/18 1726 95 %     Weight 08/09/18 1726 196 lb 3.4 oz (89 kg)     Height 08/09/18 1726 6\' 4"  (1.93 m)     Head  Circumference --      Peak Flow --      Pain Score 08/09/18 1725 2     Pain Loc --      Pain Edu? --      Excl. in GC? --     Constitutional: Alert and oriented. Well appearing and in no distress. Head: Normocephalic and atraumatic. Eyes: Conjunctivae are normal. Normal extraocular movements Cardiovascular: Normal rate, regular rhythm. Normal distal pulses. Respiratory: Normal respiratory effort. No wheezes/rales/rhonchi. Musculoskeletal: Nontender with normal range of motion in all extremities.  Neurologic:  Normal gait without ataxia. Normal speech and language. No gross focal neurologic deficits are appreciated. Skin:  Skin is warm, dry and intact. No rash  noted.  Patient with multiple erythematous macular whelps across the back consistent with local reaction to insect bite.  No central punctum, pointing, blister, vesicle, or ulcerations noted.  No excoriations are appreciated. ____________________________________________  INITIAL IMPRESSION / ASSESSMENT AND PLAN / ED COURSE  Patient with ED evaluation of multiple insect bites primarily to the back.  Patient without any signs of anaphylaxis or concerning cellulitis.  He appears to have local reaction deep to the insect bites.  He is advised to apply topical over-the-counter cortisone cream.  He is also advised to take antihistamines as needed.  He will follow-up with his primary provider or return to the ED as needed. ____________________________________________  FINAL CLINICAL IMPRESSION(S) / ED DIAGNOSES  Final diagnoses:  Insect bite of lower back, initial encounter      Lissa Hoard, PA-C 08/10/18 1032    Governor Rooks, MD 08/14/18 [phone]

## 2018-08-09 NOTE — ED Triage Notes (Signed)
Red, raised bug bits to back that occurred after being outside yesterday. Pt alert and oriented X4, active, cooperative, pt in NAD. RR even and unlabored, color WNL.  Aching pain.

## 2018-08-09 NOTE — Discharge Instructions (Addendum)
Your exam is consistent with multiple insect bites. Avoid hot showers and excessive heat exposure outside. Apply your (steroid) cream to the bites as discussed. Follow-up with your provider for continued symptoms.

## 2018-08-10 DIAGNOSIS — M1712 Unilateral primary osteoarthritis, left knee: Secondary | ICD-10-CM | POA: Diagnosis present

## 2018-08-10 MED ORDER — TRANEXAMIC ACID 1000 MG/10ML IV SOLN
2000.0000 mg | INTRAVENOUS | Status: DC
  Filled 2018-08-10: qty 20

## 2018-08-10 MED ORDER — BUPIVACAINE LIPOSOME 1.3 % IJ SUSP
20.0000 mL | INTRAMUSCULAR | Status: DC
  Filled 2018-08-10: qty 20

## 2018-08-10 NOTE — H&P (Signed)
TOTAL KNEE ADMISSION H&P  Patient is being admitted for left total knee arthroplasty.  Subjective:  Chief Complaint:left knee pain.  HPI: Harold Sandoval, 77 y.o. male, has a history of pain and functional disability in the left knee due to arthritis and has failed non-surgical conservative treatments for greater than 12 weeks to includeNSAID's and/or analgesics, corticosteriod injections, flexibility and strengthening excercises, use of assistive devices, weight reduction as appropriate and activity modification.  Onset of symptoms was gradual, starting 2 years ago with gradually worsening course since that time. The patient noted no past surgery on the left knee(s).  Patient currently rates pain in the left knee(s) at 10 out of 10 with activity. Patient has night pain, worsening of pain with activity and weight bearing, pain that interferes with activities of daily living, pain with passive range of motion and crepitus.  Patient has evidence of periarticular osteophytes, joint subluxation and joint space narrowing by imaging studies.  There is no active infection.  Patient Active Problem List   Diagnosis Date Noted  . 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 Osteoarthritis of left hip 02/09/2018  . Constipation   . Change in bowel habits   . Chest pain 05/12/2016  . Strain of rectus abdominis muscle 08/01/2015   Past Medical History:  Diagnosis Date  . Arthritis    "all over" (02/10/2018)  . Balance problem    when first stands.  Since stroke.  . Constipation   . 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 Degenerative joint disease (DJD) of hip   . Diabetic neuropathy (HCC)   . Diabetic neuropathy (HCC)    fingers  . 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 Glaucoma, both eyes   . High cholesterol   . History of gout   . Lambl's excrescence on aortic  valve   . Macular degeneration, left eye   . Sinus headache    resolved  . Type II diabetes mellitus (HCC)     Past Surgical History:  Procedure Laterality Date  . APPENDECTOMY    . BACK SURGERY    . CATARACT EXTRACTION, BILATERAL    . COLONOSCOPY WITH PROPOFOL N/A 12/29/2016   Procedure: COLONOSCOPY WITH PROPOFOL;  Surgeon: Midge Minium, MD;  Location: [facility];  Service: Endoscopy;  Laterality: N/A;  Diabetic - oral meds  . JOINT REPLACEMENT    . LAPAROSCOPIC CHOLECYSTECTOMY    . LOOP RECORDER INSERTION N/A 02/24/2017   Procedure: Loop Recorder Insertion;  Surgeon: Marcina Millard, MD;  Location: [facility] INVASIVE CV LAB;  Service: Cardiovascular;  Laterality: N/A;  . LUMBAR DISC SURGERY    . TOTAL HIP ARTHROPLASTY Left 02/10/2018   Procedure: TOTAL HIP ARTHROPLASTY ANTERIOR APPROACH;  Surgeon: Gean Birchwood, MD;  Location: [facility] OR;  Service: Orthopedics;  Laterality: Left;    Current Facility-Administered Medications  Medication Dose Route Frequency Provider Last Rate Last Dose  . [START ON 08/11/2018] bupivacaine liposome (EXPAREL) 1.3 % injection 266 mg  20 mL Infiltration On Call to OR Gean Birchwood, MD      . Melene Muller ON 08/11/2018] tranexamic acid (CYKLOKAPRON) 2,000 mg in sodium chloride 0.9 % 50 mL Topical Application  2,000 mg Topical On Call to OR Gean Birchwood, MD       Current Outpatient Medications  Medication Sig Dispense Refill Last Dose  . acetaminophen (TYLENOL) 325 MG tablet Take 325-650 mg by  mouth every 6 (six) hours as needed (FOR PAIN.).     Marland Kitchen aspirin 81 MG chewable tablet Chew 81 mg by mouth daily.     Marland Kitchen atorvastatin (LIPITOR) 80 MG tablet Take 80 mg by mouth every evening.    02/09/2018 at Unknown time  . brimonidine (ALPHAGAN) 0.2 % ophthalmic solution Place 1 drop into both eyes 2 (two) times daily.   0 02/09/2018 at Unknown time  . brinzolamide (AZOPT) 1 % ophthalmic suspension Place 1 drop into both eyes 2 (two) times daily.   02/09/2018 at Unknown time  .  Cholecalciferol (VITAMIN D3) 2000 units capsule Take 2,000 Units by mouth daily.   02/09/2018 at Unknown time  . desonide (DESOWEN) 0.05 % lotion Apply 1 application topically 2 (two) times daily.   02/09/2018 at Unknown time  . fluticasone (FLONASE) 50 MCG/ACT nasal spray Place 2 sprays into both nostrils daily as needed for rhinitis.   02/09/2018 at Unknown time  . indomethacin (INDOCIN SR) 75 MG CR capsule Take 75 mg by mouth 2 (two) times daily as needed for pain.  1   . latanoprost (XALATAN) 0.005 % ophthalmic solution Place 1 drop into both eyes at bedtime.   0 02/09/2018 at Unknown time  . metFORMIN (GLUCOPHAGE-XR) 500 MG 24 hr tablet Take 500 mg by mouth 2 (two) times daily.    02/09/2018 at Unknown time  . senna (SENOKOT) 8.6 MG tablet Take 2 tablets by mouth daily as needed for constipation.   02/07/2018  . traMADol (ULTRAM) 50 MG tablet Take 50 mg by mouth every 4 (four) hours as needed for pain.  0   . apixaban (ELIQUIS) 2.5 MG TABS tablet Take 1 tablet (2.5 mg total) by mouth 2 (two) times daily. (Patient not taking: Reported on 07/28/2018) 30 tablet 0 Not Taking at Unknown time  . oxyCODONE-acetaminophen (PERCOCET/ROXICET) 5-325 MG tablet Take 1 tablet by mouth every 4 (four) hours as needed for severe pain. (Patient not taking: Reported on 07/28/2018) 30 tablet 0 Not Taking at Unknown time  . tiZANidine (ZANAFLEX) 2 MG tablet Take 1 tablet (2 mg total) by mouth every 6 (six) hours as needed for muscle spasms. (Patient not taking: Reported on 07/28/2018) 60 tablet 0 Not Taking at Unknown time   No Known Allergies  Social History   Tobacco Use  . Smoking status: Never Smoker  . Smokeless tobacco: Never Used  Substance Use Topics  . Alcohol use: No    Alcohol/week: 0.0 standard drinks    Family History  Problem Relation Age of Onset  . Cancer Mother   . Cancer Father      Review of Systems  Constitutional: Positive for weight loss.  HENT: Negative.   Eyes: Negative.   Respiratory:  Negative.   Cardiovascular: Negative.   Gastrointestinal: Negative.   Genitourinary: Negative.   Musculoskeletal: Positive for joint pain.  Skin: Negative.   Neurological: Negative.   Endo/Heme/Allergies: Negative.   Psychiatric/Behavioral: Negative.     Objective:  Physical Exam  Constitutional: He is oriented to person, place, and time. He appears well-developed and well-nourished.  HENT:  Head: Normocephalic and atraumatic.  Eyes: Pupils are equal, round, and reactive to light.  Neck: Normal range of motion. Neck supple.  Cardiovascular: Intact distal pulses.  Respiratory: Effort normal.  Musculoskeletal: He exhibits tenderness.  The left knee is tender along the medial joint line, 8 flexion contracture, flexion to 125 with pain.  Collateral ligaments are stable.    Neurological: He is  alert and oriented to person, place, and time.  Skin: Skin is warm and dry.  Psychiatric: He has a normal mood and affect. His behavior is normal. Judgment and thought content normal.    Vital signs in last 24 hours: Temp:  [98.1 F (36.7 C)] 98.1 F (36.7 C) (09/02 1726) Pulse Rate:  [57-87] 57 (09/02 1843) Resp:  [18-20] 20 (09/02 1843) BP: (157-174)/(72-88) 157/72 (09/02 1843) SpO2:  [95 %-97 %] 97 % (09/02 1843) Weight:  [89 kg] 89 kg (09/02 1726)  Labs:   Estimated body mass index is 23.88 kg/m as calculated from the following:   Height as of 08/09/18: 6\' 4"  (1.93 m).   Weight as of 08/09/18: 89 kg.   Imaging Review Plain radiographs demonstrate  End-stage arthritis of the left knee, primarily the medial compartment, varus deformity, bone-on-bone, early lateral subluxation of tibia beneath the femur.   Preoperative templating of the joint replacement has been completed, documented, and submitted to the Operating Room personnel in order to optimize intra-operative equipment management.   Anticipated LOS equal to or greater than 2 midnights due to - Age 38 and older with one  or more of the following:  - Obesity  - Expected need for hospital services (PT, OT, Nursing) required for safe  discharge  - Anticipated need for postoperative skilled nursing care or inpatient rehab  - Active co-morbidities: Diabetes    Assessment/Plan:  End stage arthritis, left knee   The patient history, physical examination, clinical judgment of the provider and imaging studies are consistent with end stage degenerative joint disease of the left knee(s) and total knee arthroplasty is deemed medically necessary. The treatment options including medical management, injection therapy arthroscopy and arthroplasty were discussed at length. The risks and benefits of total knee arthroplasty were presented and reviewed. The risks due to aseptic loosening, infection, stiffness, patella tracking problems, thromboembolic complications and other imponderables were discussed. The patient acknowledged the explanation, agreed to proceed with the plan and consent was signed. Patient is being admitted for inpatient treatment for surgery, pain control, PT, OT, prophylactic antibiotics, VTE prophylaxis, progressive ambulation and ADL's and discharge planning. The patient is planning to be discharged home with home health services.

## 2018-08-11 ENCOUNTER — Encounter (HOSPITAL_COMMUNITY)
Admission: RE | Disposition: A | Discharge status: Home Health | Payer: Self-pay | Source: Home / Self Care | Attending: Orthopedic Surgery

## 2018-08-11 ENCOUNTER — Inpatient Hospital Stay (HOSPITAL_COMMUNITY)
Admission: RE | Admit: 2018-08-11 | Discharge: 2018-08-13 | DRG: 470 | Disposition: A | Discharge status: Home Health | Payer: Medicare HMO | Attending: Orthopedic Surgery | Admitting: Orthopedic Surgery

## 2018-08-11 ENCOUNTER — Other Ambulatory Visit: Payer: Self-pay

## 2018-08-11 ENCOUNTER — Inpatient Hospital Stay (HOSPITAL_COMMUNITY): Payer: Medicare HMO | Admitting: Certified Registered Nurse Anesthetist

## 2018-08-11 ENCOUNTER — Encounter (HOSPITAL_COMMUNITY): Payer: Self-pay | Admitting: *Deleted

## 2018-08-11 DIAGNOSIS — E7800 Pure hypercholesterolemia, unspecified: Secondary | ICD-10-CM | POA: Diagnosis present

## 2018-08-11 DIAGNOSIS — Z9049 Acquired absence of other specified parts of digestive tract: Secondary | ICD-10-CM | POA: Diagnosis not present

## 2018-08-11 DIAGNOSIS — M109 Gout, unspecified: Secondary | ICD-10-CM | POA: Diagnosis present

## 2018-08-11 DIAGNOSIS — Z7982 Long term (current) use of aspirin: Secondary | ICD-10-CM | POA: Diagnosis not present

## 2018-08-11 DIAGNOSIS — I69311 Memory deficit following cerebral infarction: Secondary | ICD-10-CM | POA: Diagnosis not present

## 2018-08-11 DIAGNOSIS — K5900 Constipation, unspecified: Secondary | ICD-10-CM | POA: Diagnosis present

## 2018-08-11 DIAGNOSIS — Z79899 Other long term (current) drug therapy: Secondary | ICD-10-CM

## 2018-08-11 DIAGNOSIS — E669 Obesity, unspecified: Secondary | ICD-10-CM | POA: Diagnosis present

## 2018-08-11 DIAGNOSIS — H409 Unspecified glaucoma: Secondary | ICD-10-CM | POA: Diagnosis present

## 2018-08-11 DIAGNOSIS — I69398 Other sequelae of cerebral infarction: Secondary | ICD-10-CM | POA: Diagnosis not present

## 2018-08-11 DIAGNOSIS — Z79891 Long term (current) use of opiate analgesic: Secondary | ICD-10-CM

## 2018-08-11 DIAGNOSIS — E1140 Type 2 diabetes mellitus with diabetic neuropathy, unspecified: Secondary | ICD-10-CM | POA: Diagnosis present

## 2018-08-11 DIAGNOSIS — Z7984 Long term (current) use of oral hypoglycemic drugs: Secondary | ICD-10-CM | POA: Diagnosis not present

## 2018-08-11 DIAGNOSIS — M25762 Osteophyte, left knee: Secondary | ICD-10-CM | POA: Diagnosis present

## 2018-08-11 DIAGNOSIS — D62 Acute posthemorrhagic anemia: Secondary | ICD-10-CM | POA: Diagnosis not present

## 2018-08-11 DIAGNOSIS — Z9841 Cataract extraction status, right eye: Secondary | ICD-10-CM | POA: Diagnosis not present

## 2018-08-11 DIAGNOSIS — H3530 Unspecified macular degeneration: Secondary | ICD-10-CM | POA: Diagnosis present

## 2018-08-11 DIAGNOSIS — Z9842 Cataract extraction status, left eye: Secondary | ICD-10-CM | POA: Diagnosis not present

## 2018-08-11 DIAGNOSIS — M1712 Unilateral primary osteoarthritis, left knee: Secondary | ICD-10-CM | POA: Diagnosis present

## 2018-08-11 DIAGNOSIS — Z6823 Body mass index (BMI) 23.0-23.9, adult: Secondary | ICD-10-CM

## 2018-08-11 DIAGNOSIS — Z96642 Presence of left artificial hip joint: Secondary | ICD-10-CM | POA: Diagnosis present

## 2018-08-11 DIAGNOSIS — Z7901 Long term (current) use of anticoagulants: Secondary | ICD-10-CM

## 2018-08-11 HISTORY — PX: TOTAL KNEE ARTHROPLASTY: SHX125

## 2018-08-11 LAB — URINALYSIS, ROUTINE W REFLEX MICROSCOPIC
Bacteria, UA: NONE SEEN
Bilirubin Urine: NEGATIVE
Glucose, UA: >=500 mg/dL — AB
Ketones, ur: NEGATIVE mg/dL
Leukocytes, UA: NEGATIVE
Nitrite: NEGATIVE
Protein, ur: NEGATIVE mg/dL
Specific Gravity, Urine: 1.011 (ref 1.005–1.030)
pH: 6 (ref 5.0–8.0)

## 2018-08-11 LAB — HEMOGLOBIN A1C
Hgb A1c MFr Bld: 7.1 % — ABNORMAL HIGH (ref 4.8–5.6)
Mean Plasma Glucose: 157.07 mg/dL

## 2018-08-11 LAB — GLUCOSE, CAPILLARY
Glucose-Capillary: 100 mg/dL — ABNORMAL HIGH (ref 70–99)
Glucose-Capillary: 90 mg/dL (ref 70–99)
Glucose-Capillary: 139 mg/dL — ABNORMAL HIGH (ref 70–99)
Glucose-Capillary: 248 mg/dL — ABNORMAL HIGH (ref 70–99)

## 2018-08-11 LAB — TYPE AND SCREEN
ABO/RH(D): B POS
Antibody Screen: NEGATIVE

## 2018-08-11 SURGERY — TOTAL KNEE ARTHROPLASTY
Anesthesia: Spinal | Site: Knee | Laterality: Left

## 2018-08-11 MED ORDER — DIPHENHYDRAMINE HCL 12.5 MG/5ML PO ELIX: 12.5000 mg | ORAL_SOLUTION | ORAL | Status: DC | PRN

## 2018-08-11 MED ORDER — ASPIRIN 81 MG PO CHEW
81.0000 mg | CHEWABLE_TABLET | Freq: Two times a day (BID) | ORAL | Status: DC
  Administered 2018-08-11 – 2018-08-13 (×4): 81 mg via ORAL
  Filled 2018-08-11 (×4): qty 1

## 2018-08-11 MED ORDER — ONDANSETRON HCL 4 MG/2ML IJ SOLN: 4.0000 mg | Freq: Four times a day (QID) | INTRAMUSCULAR | Status: DC | PRN

## 2018-08-11 MED ORDER — INDOMETHACIN ER 75 MG PO CPCR
75.0000 mg | ORAL_CAPSULE | Freq: Two times a day (BID) | ORAL | Status: DC
  Administered 2018-08-12 – 2018-08-13 (×3): 75 mg via ORAL
  Filled 2018-08-11 (×4): qty 1

## 2018-08-11 MED ORDER — PROPOFOL 10 MG/ML IV BOLUS
INTRAVENOUS | Status: DC | PRN
  Administered 2018-08-11: 10 mg via INTRAVENOUS
  Administered 2018-08-11: 20 mg via INTRAVENOUS

## 2018-08-11 MED ORDER — ASPIRIN EC 81 MG PO TBEC: 81.0000 mg | DELAYED_RELEASE_TABLET | Freq: Two times a day (BID) | ORAL | 0 refills | Status: DC

## 2018-08-11 MED ORDER — BUPIVACAINE-EPINEPHRINE (PF) 0.5% -1:200000 IJ SOLN
INTRAMUSCULAR | Status: AC
  Filled 2018-08-11: qty 30

## 2018-08-11 MED ORDER — ACETAMINOPHEN 325 MG PO TABS: 325.0000 mg | ORAL_TABLET | Freq: Four times a day (QID) | ORAL | Status: DC | PRN

## 2018-08-11 MED ORDER — LIDOCAINE 2% (20 MG/ML) 5 ML SYRINGE
INTRAMUSCULAR | Status: AC
  Filled 2018-08-11: qty 5

## 2018-08-11 MED ORDER — METOCLOPRAMIDE HCL 5 MG/ML IJ SOLN: 5.0000 mg | Freq: Three times a day (TID) | INTRAMUSCULAR | Status: DC | PRN

## 2018-08-11 MED ORDER — BUPIVACAINE LIPOSOME 1.3 % IJ SUSP
INTRAMUSCULAR | Status: DC | PRN
  Administered 2018-08-11: 20 mL

## 2018-08-11 MED ORDER — TRANEXAMIC ACID 1000 MG/10ML IV SOLN: 1000.0000 mg | INTRAVENOUS | Status: DC

## 2018-08-11 MED ORDER — KCL IN DEXTROSE-NACL 20-5-0.45 MEQ/L-%-% IV SOLN
INTRAVENOUS | Status: DC
  Administered 2018-08-11 – 2018-08-12 (×3): via INTRAVENOUS
  Filled 2018-08-11 (×3): qty 1000

## 2018-08-11 MED ORDER — MIDAZOLAM HCL 2 MG/2ML IJ SOLN
1.0000 mg | INTRAMUSCULAR | Status: AC
  Administered 2018-08-11: 2 mg via INTRAVENOUS

## 2018-08-11 MED ORDER — DOCUSATE SODIUM 100 MG PO CAPS
100.0000 mg | ORAL_CAPSULE | Freq: Two times a day (BID) | ORAL | Status: DC
  Administered 2018-08-11 – 2018-08-13 (×4): 100 mg via ORAL
  Filled 2018-08-11 (×4): qty 1

## 2018-08-11 MED ORDER — FLUTICASONE PROPIONATE 50 MCG/ACT NA SUSP
2.0000 | Freq: Every day | NASAL | Status: DC | PRN
  Filled 2018-08-11: qty 16

## 2018-08-11 MED ORDER — BUPIVACAINE IN DEXTROSE 0.75-8.25 % IT SOLN
INTRATHECAL | Status: DC | PRN
  Administered 2018-08-11: 1.8 mL via INTRATHECAL

## 2018-08-11 MED ORDER — INSULIN ASPART 100 UNIT/ML ~~LOC~~ SOLN
0.0000 [IU] | Freq: Three times a day (TID) | SUBCUTANEOUS | Status: DC
  Administered 2018-08-12: 2 [IU] via SUBCUTANEOUS
  Administered 2018-08-12 (×2): 3 [IU] via SUBCUTANEOUS
  Administered 2018-08-13: 5 [IU] via SUBCUTANEOUS

## 2018-08-11 MED ORDER — FENTANYL CITRATE (PF) 100 MCG/2ML IJ SOLN
50.0000 ug | INTRAMUSCULAR | Status: AC
  Administered 2018-08-11: 100 ug via INTRAVENOUS

## 2018-08-11 MED ORDER — PROMETHAZINE HCL 25 MG/ML IJ SOLN: 6.2500 mg | INTRAMUSCULAR | Status: DC | PRN

## 2018-08-11 MED ORDER — TRANEXAMIC ACID 1000 MG/10ML IV SOLN
1000.0000 mg | Freq: Once | INTRAVENOUS | Status: AC
  Administered 2018-08-11: 1000 mg via INTRAVENOUS
  Filled 2018-08-11: qty 1000

## 2018-08-11 MED ORDER — TRANEXAMIC ACID 1000 MG/10ML IV SOLN
INTRAVENOUS | Status: DC | PRN
  Administered 2018-08-11: 2000 mg via TOPICAL

## 2018-08-11 MED ORDER — PROPOFOL 10 MG/ML IV BOLUS
INTRAVENOUS | Status: AC
  Filled 2018-08-11: qty 20

## 2018-08-11 MED ORDER — ROPIVACAINE HCL 7.5 MG/ML IJ SOLN
INTRAMUSCULAR | Status: DC | PRN
  Administered 2018-08-11 (×6): 5 mL via PERINEURAL

## 2018-08-11 MED ORDER — POLYETHYLENE GLYCOL 3350 17 G PO PACK
17.0000 g | PACK | Freq: Every day | ORAL | Status: DC | PRN
  Administered 2018-08-12: 17 g via ORAL
  Filled 2018-08-11: qty 1

## 2018-08-11 MED ORDER — HYDROMORPHONE HCL 1 MG/ML IJ SOLN: 0.5000 mg | INTRAMUSCULAR | Status: DC | PRN

## 2018-08-11 MED ORDER — OXYCODONE HCL 5 MG PO TABS
5.0000 mg | ORAL_TABLET | ORAL | Status: DC | PRN
  Administered 2018-08-12 – 2018-08-13 (×4): 10 mg via ORAL
  Filled 2018-08-11 (×2): qty 2
  Filled 2018-08-11: qty 1
  Filled 2018-08-11 (×2): qty 2

## 2018-08-11 MED ORDER — DESONIDE 0.05 % EX LOTN
1.0000 "application " | TOPICAL_LOTION | Freq: Two times a day (BID) | CUTANEOUS | Status: DC
  Administered 2018-08-13: 1 "application " via TOPICAL

## 2018-08-11 MED ORDER — OXYCODONE-ACETAMINOPHEN 5-325 MG PO TABS: 1.0000 | ORAL_TABLET | ORAL | 0 refills | Status: DC | PRN

## 2018-08-11 MED ORDER — SODIUM CHLORIDE 0.9 % IR SOLN
Status: DC | PRN
  Administered 2018-08-11: 1000 mL

## 2018-08-11 MED ORDER — GABAPENTIN 300 MG PO CAPS
300.0000 mg | ORAL_CAPSULE | Freq: Three times a day (TID) | ORAL | Status: DC
  Administered 2018-08-11 – 2018-08-13 (×5): 300 mg via ORAL
  Filled 2018-08-11 (×5): qty 1

## 2018-08-11 MED ORDER — TRANEXAMIC ACID 1000 MG/10ML IV SOLN
1000.0000 mg | INTRAVENOUS | Status: AC
  Administered 2018-08-11: 1000 mg via INTRAVENOUS
  Filled 2018-08-11: qty 10

## 2018-08-11 MED ORDER — METHOCARBAMOL 500 MG IVPB - SIMPLE MED
500.0000 mg | Freq: Four times a day (QID) | INTRAVENOUS | Status: DC | PRN
  Filled 2018-08-11 (×2): qty 50

## 2018-08-11 MED ORDER — LACTATED RINGERS IV SOLN
INTRAVENOUS | Status: DC
  Administered 2018-08-11: 12:00:00 via INTRAVENOUS

## 2018-08-11 MED ORDER — WATER FOR IRRIGATION, STERILE IR SOLN
Status: DC | PRN
  Administered 2018-08-11: 2000 mL

## 2018-08-11 MED ORDER — METFORMIN HCL ER 500 MG PO TB24
500.0000 mg | ORAL_TABLET | Freq: Two times a day (BID) | ORAL | Status: DC
  Administered 2018-08-12 – 2018-08-13 (×3): 500 mg via ORAL
  Filled 2018-08-11 (×3): qty 1

## 2018-08-11 MED ORDER — FENTANYL CITRATE (PF) 100 MCG/2ML IJ SOLN: 25.0000 ug | INTRAMUSCULAR | Status: DC | PRN

## 2018-08-11 MED ORDER — MIDAZOLAM HCL 2 MG/2ML IJ SOLN
INTRAMUSCULAR | Status: AC
  Administered 2018-08-11: 2 mg via INTRAVENOUS
  Filled 2018-08-11: qty 2

## 2018-08-11 MED ORDER — CHLORHEXIDINE GLUCONATE 4 % EX LIQD: 60.0000 mL | Freq: Once | CUTANEOUS | Status: DC

## 2018-08-11 MED ORDER — FLEET ENEMA 7-19 GM/118ML RE ENEM: 1.0000 | ENEMA | Freq: Once | RECTAL | Status: DC | PRN

## 2018-08-11 MED ORDER — ONDANSETRON HCL 4 MG/2ML IJ SOLN
INTRAMUSCULAR | Status: DC | PRN
  Administered 2018-08-11: 4 mg via INTRAVENOUS

## 2018-08-11 MED ORDER — DEXAMETHASONE SODIUM PHOSPHATE 10 MG/ML IJ SOLN
INTRAMUSCULAR | Status: DC | PRN
  Administered 2018-08-11: 10 mg via INTRAVENOUS

## 2018-08-11 MED ORDER — BRINZOLAMIDE 1 % OP SUSP
1.0000 [drp] | Freq: Two times a day (BID) | OPHTHALMIC | Status: DC
  Administered 2018-08-11 – 2018-08-13 (×4): 1 [drp] via OPHTHALMIC
  Filled 2018-08-11: qty 10

## 2018-08-11 MED ORDER — BRIMONIDINE TARTRATE 0.2 % OP SOLN
1.0000 [drp] | Freq: Two times a day (BID) | OPHTHALMIC | Status: DC
  Administered 2018-08-11 – 2018-08-13 (×4): 1 [drp] via OPHTHALMIC
  Filled 2018-08-11: qty 5

## 2018-08-11 MED ORDER — PHENOL 1.4 % MT LIQD
1.0000 | OROMUCOSAL | Status: DC | PRN
  Filled 2018-08-11: qty 177

## 2018-08-11 MED ORDER — BISACODYL 5 MG PO TBEC: 5.0000 mg | DELAYED_RELEASE_TABLET | Freq: Every day | ORAL | Status: DC | PRN

## 2018-08-11 MED ORDER — METHOCARBAMOL 500 MG PO TABS
500.0000 mg | ORAL_TABLET | Freq: Four times a day (QID) | ORAL | Status: DC | PRN
  Administered 2018-08-13: 500 mg via ORAL
  Filled 2018-08-11: qty 1

## 2018-08-11 MED ORDER — ONDANSETRON HCL 4 MG PO TABS: 4.0000 mg | ORAL_TABLET | Freq: Four times a day (QID) | ORAL | Status: DC | PRN

## 2018-08-11 MED ORDER — FENTANYL CITRATE (PF) 100 MCG/2ML IJ SOLN
INTRAMUSCULAR | Status: AC
  Administered 2018-08-11: 100 ug via INTRAVENOUS
  Filled 2018-08-11: qty 2

## 2018-08-11 MED ORDER — METOCLOPRAMIDE HCL 5 MG PO TABS: 5.0000 mg | ORAL_TABLET | Freq: Three times a day (TID) | ORAL | Status: DC | PRN

## 2018-08-11 MED ORDER — LATANOPROST 0.005 % OP SOLN
1.0000 [drp] | Freq: Every day | OPHTHALMIC | Status: DC
  Administered 2018-08-11 – 2018-08-12 (×2): 1 [drp] via OPHTHALMIC
  Filled 2018-08-11: qty 2.5

## 2018-08-11 MED ORDER — PANTOPRAZOLE SODIUM 40 MG PO TBEC
40.0000 mg | DELAYED_RELEASE_TABLET | Freq: Every day | ORAL | Status: DC
  Administered 2018-08-11 – 2018-08-13 (×3): 40 mg via ORAL
  Filled 2018-08-11 (×3): qty 1

## 2018-08-11 MED ORDER — CEFAZOLIN SODIUM-DEXTROSE 2-4 GM/100ML-% IV SOLN
2.0000 g | INTRAVENOUS | Status: AC
  Administered 2018-08-11: 2 g via INTRAVENOUS
  Filled 2018-08-11: qty 100

## 2018-08-11 MED ORDER — TIZANIDINE HCL 2 MG PO TABS: 2.0000 mg | ORAL_TABLET | Freq: Four times a day (QID) | ORAL | 0 refills | Status: DC | PRN

## 2018-08-11 MED ORDER — SODIUM CHLORIDE 0.9 % IJ SOLN
INTRAMUSCULAR | Status: AC
  Filled 2018-08-11: qty 50

## 2018-08-11 MED ORDER — SODIUM CHLORIDE 0.9 % IJ SOLN
INTRAMUSCULAR | Status: DC | PRN
  Administered 2018-08-11: 50 mL

## 2018-08-11 MED ORDER — MENTHOL 3 MG MT LOZG: 1.0000 | LOZENGE | OROMUCOSAL | Status: DC | PRN

## 2018-08-11 MED ORDER — PROPOFOL 500 MG/50ML IV EMUL
INTRAVENOUS | Status: DC | PRN
  Administered 2018-08-11: 100 ug/kg/min via INTRAVENOUS

## 2018-08-11 MED ORDER — DEXAMETHASONE SODIUM PHOSPHATE 10 MG/ML IJ SOLN
10.0000 mg | Freq: Once | INTRAMUSCULAR | Status: AC
  Administered 2018-08-12: 10 mg via INTRAVENOUS
  Filled 2018-08-11: qty 1

## 2018-08-11 MED ORDER — ALUM & MAG HYDROXIDE-SIMETH 200-200-20 MG/5ML PO SUSP: 30.0000 mL | ORAL | Status: DC | PRN

## 2018-08-11 MED ORDER — LACTATED RINGERS IV SOLN
INTRAVENOUS | Status: DC
  Administered 2018-08-11 (×2): via INTRAVENOUS

## 2018-08-11 MED ORDER — BUPIVACAINE-EPINEPHRINE (PF) 0.5% -1:200000 IJ SOLN
INTRAMUSCULAR | Status: DC | PRN
  Administered 2018-08-11: 20 mL

## 2018-08-11 SURGICAL SUPPLY — 58 items
ATTUNE MED DOME PAT 41 KNEE (Knees) ×2 IMPLANT
ATTUNE PS FEM LT CEM SZ9 KNEE (Femur) ×2 IMPLANT
ATTUNE TIBIAL INSERT SIZE 9, 5MM ×2 IMPLANT
BAG DECANTER FOR FLEXI CONT (MISCELLANEOUS) ×2
BAG ZIPLOCK 12X15 (MISCELLANEOUS) ×2
BLADE SAG 18X100X1.27 (BLADE) ×2
BLADE SAGITTAL 13X1.27X60 (BLADE) ×2
BLADE SAW SGTL 13.0X1.19X90.0M (BLADE)
BNDG ELASTIC 6X10 VLCR STRL LF (GAUZE/BANDAGES/DRESSINGS) ×2
BOWL SMART MIX CTS (DISPOSABLE) ×2
CEMENT HV SMART SET (Cement) ×4 IMPLANT
COVER SURGICAL LIGHT HANDLE (MISCELLANEOUS) ×2
CUFF TOURN SGL QUICK 34 (TOURNIQUET CUFF) ×1
CUFF TRNQT CYL 34X4X40X1 (TOURNIQUET CUFF) ×1
DECANTER SPIKE VIAL GLASS SM (MISCELLANEOUS) ×4
DRAPE ORTHO SPLIT 77X108 STRL (DRAPES) ×1
DRAPE SURG ORHT 6 SPLT 77X108 (DRAPES) ×1
DRAPE U-SHAPE 47X51 STRL (DRAPES) ×2
DRSG AQUACEL AG ADV 3.5X10 (GAUZE/BANDAGES/DRESSINGS) ×2
DURAPREP 26ML APPLICATOR (WOUND CARE) ×2
ELECT REM PT RETURN 15FT ADLT (MISCELLANEOUS) ×2
GLOVE BIO SURGEON STRL SZ7.5 (GLOVE) ×2
GLOVE BIO SURGEON STRL SZ8.5 (GLOVE) ×2
GLOVE BIOGEL PI IND STRL 7.5 (GLOVE) ×5
GLOVE BIOGEL PI IND STRL 8 (GLOVE) ×2
GLOVE BIOGEL PI IND STRL 9 (GLOVE) ×1
GLOVE BIOGEL PI INDICATOR 7.5 (GLOVE) ×5
GLOVE BIOGEL PI INDICATOR 8 (GLOVE) ×2
GLOVE BIOGEL PI INDICATOR 9 (GLOVE) ×1
GLOVE SURG SS PI 7.5 STRL IVOR (GLOVE) ×2
GOWN STRL REUS W/ TWL LRG LVL3 (GOWN DISPOSABLE) ×1
GOWN STRL REUS W/ TWL XL LVL3 (GOWN DISPOSABLE) ×2
GOWN STRL REUS W/TWL LRG LVL3 (GOWN DISPOSABLE) ×1
GOWN STRL REUS W/TWL XL LVL3 (GOWN DISPOSABLE) ×6
HANDPIECE INTERPULSE COAX TIP (DISPOSABLE) ×1
HOLDER FOLEY CATH W/STRAP (MISCELLANEOUS) ×2
HOOD PEEL AWAY FLYTE STAYCOOL (MISCELLANEOUS) ×6
NEEDLE HYPO 22GX1.5 SAFETY (NEEDLE) ×2
NS IRRIG 1000ML POUR BTL (IV SOLUTION) ×2
PACK ICE MAXI GEL EZY WRAP (MISCELLANEOUS) ×2
PACK TOTAL KNEE CUSTOM (KITS) ×2
PIN STEINMAN FIXATION KNEE (PIN) ×2
PIN THREADED HEADED SIGMA (PIN) ×2
POSITIONER SURGICAL ARM (MISCELLANEOUS) ×2
SET HNDPC FAN SPRY TIP SCT (DISPOSABLE) ×1
STAPLER VISISTAT 35W (STAPLE)
SUT VIC AB 1 CTX 36 (SUTURE) ×1
SUT VIC AB 1 CTX36XBRD ANBCTR (SUTURE) ×1
SUT VIC AB 2-0 CT1 27 (SUTURE) ×1
SUT VIC AB 2-0 CT1 TAPERPNT 27 (SUTURE) ×1
SUT VIC AB 3-0 CT1 27 (SUTURE) ×1
SUT VIC AB 3-0 CT1 TAPERPNT 27 (SUTURE) ×1
SYR CONTROL 10ML LL (SYRINGE) ×4
TIBIAL BASE ROT PLAT SZ 10 KNE (Miscellaneous) ×2 IMPLANT
TRAY FOLEY MTR SLVR 16FR STAT (SET/KITS/TRAYS/PACK) ×2
WATER STERILE IRR 1000ML POUR (IV SOLUTION) ×4
WRAP KNEE MAXI GEL POST OP (GAUZE/BANDAGES/DRESSINGS) ×2
YANKAUER SUCT BULB TIP 10FT TU (MISCELLANEOUS) ×2

## 2018-08-11 NOTE — Anesthesia Procedure Notes (Signed)
Spinal  Start time: 08/11/2018 2:12 PM End time: 08/11/2018 2:17 PM Staffing Anesthesiologist: Leilani Able, MD Performed: anesthesiologist  Preanesthetic Checklist Completed: patient identified, site marked, surgical consent, pre-op evaluation, timeout performed, IV checked, risks and benefits discussed and monitors and equipment checked Spinal Block Patient position: sitting Prep: site prepped and draped and DuraPrep Patient monitoring: continuous pulse ox and blood pressure Approach: midline Location: L2-3 Injection technique: single-shot Needle Needle type: Pencan  Needle gauge: 24 G Needle length: 10 cm Needle insertion depth: 5 cm Assessment Sensory level: T6

## 2018-08-11 NOTE — Progress Notes (Signed)
PT Cancellation Note  Patient Details Name: Harold Sandoval MRN: [id number] DOB: [birth year]   Cancelled Treatment:    Reason Eval/Treat Not Completed: Medical issues which prohibited therapy - Per nursing at 6:00, pt still numb and PT eval should be deferred to tomorrow. Will continue to check in.   Nicola Police, PT, DPT  Pager # [phone]    Shweta Aman D Katarina Riebe 08/11/2018, 7:17 PM

## 2018-08-11 NOTE — Op Note (Signed)
PATIENT ID:      Harold Sandoval  MRN:     [id number] DOB/AGE:    07-25-1941 / 77 y.o.       OPERATIVE REPORT    DATE OF PROCEDURE:  08/11/2018       PREOPERATIVE DIAGNOSIS:   LEFT KNEE OSTEOARTHRITIS      Estimated body mass index is 23.88 kg/m as calculated from the following:   Height as of this encounter: 6\' 4"  (1.93 m).   Weight as of this encounter: 89 kg.                                                        POSTOPERATIVE DIAGNOSIS:   LEFT KNEE OSTEOARTHRITIS                                                                      PROCEDURE:  Procedure(s): LEFT TOTAL KNEE ARTHROPLASTY Using DepuyAttune RP implants #9L Femur, #10Tibia, 5 mm Attune RP bearing, 41 Patella     SURGEON: Nestor Lewandowsky    ASSISTANT:   Tomi Likens. [employer]   (Present and scrubbed throughout the case, critical for assistance with exposure, retraction, instrumentation, and closure.)         ANESTHESIA: Spinal, 20cc Exparel, 50cc 0.25% Marcaine  EBL: 300cc  FLUID REPLACEMENT: 1500cc crystalloid  TOURNIQUET TIME:  Drains: None  Tranexamic Acid: 1gm IV, 2gm topical  COMPLICATIONS:  None         INDICATIONS FOR PROCEDURE: The patient has  LEFT KNEE OSTEOARTHRITIS, Var deformities, XR shows bone on bone arthritis, lateral subluxation of tibia. Patient has failed all conservative measures including anti-inflammatory medicines, narcotics, attempts at  exercise and weight loss, cortisone injections and viscosupplementation.  Risks and benefits of surgery have been discussed, questions answered.   DESCRIPTION OF PROCEDURE: The patient identified by armband, received  IV antibiotics, in the holding area at [facility]. Patient taken to the operating room, appropriate anesthetic  monitors were attached, and Spinal anesthesia was  induced. Tourniquet  applied high to the operative thigh. Lateral post and foot positioner  applied to the table, the lower extremity was then prepped and draped  in usual  sterile fashion from the toes to the tourniquet. Time-out procedure was performed. We began the operation, with the knee flexed 120 degrees, by making the anterior midline incision starting at handbreadth above the patella going over the patella 1 cm medial to and 4 cm distal to the tibial tubercle. Small bleeders in the skin and the  subcutaneous tissue identified and cauterized. Transverse retinaculum was incised and reflected medially and a medial parapatellar arthrotomy was accomplished. the patella was everted and theprepatellar fat pad resected. The superficial medial collateral  ligament was then elevated from anterior to posterior along the proximal  flare of the tibia and anterior half of the menisci resected. The knee was hyperflexed exposing bone on bone arthritis. Peripheral and notch osteophytes as well as the cruciate ligaments were then resected. We continued to  work our way around posteriorly along the proximal tibia,  and externally  rotated the tibia subluxing it out from underneath the femur. A McHale  retractor was placed through the notch and a lateral Hohmann retractor  placed, and we then drilled through the proximal tibia in line with the  axis of the tibia followed by an intramedullary guide rod and 2-degree  posterior slope cutting guide. The tibial cutting guide, 3 degree posterior sloped, was pinned into place allowing resection of 4 mm of bone medially and 12 mm of bone laterally. Satisfied with the tibial resection, we then  entered the distal femur 2 mm anterior to the PCL origin with the  intramedullary guide rod and applied the distal femoral cutting guide  set at 9 mm, with 5 degrees of valgus. This was pinned along the  epicondylar axis. At this point, the distal femoral cut was accomplished without difficulty. We then sized for a #9L femoral component and pinned the guide in 3 degrees of external rotation. The chamfer cutting guide was pinned into place. The anterior,  posterior, and chamfer cuts were accomplished without difficulty followed by  the Attune RP box cutting guide and the box cut. We also removed posterior osteophytes from the posterior femoral condyles. At this  time, the knee was brought into full extension. We checked our  extension and flexion gaps and found them symmetric for a 5 mm bearing. Distracting in extension with a lamina spreader, the posterior horns of the menisci were removed, and Exparel, diluted to 60 cc, with 20cc NS, and 20cc 0.5% Marcaine,was injected into the capsule and synovium of the knee. The posterior patella cut was accomplished with the 9.5 mm Attune cutting guide, sized for a 41mm dome, and the fixation pegs drilled.The knee  was then once again hyperflexed exposing the proximal tibia. We sized for a # 10 tibial base plate, applied the smokestack and the conical reamer followed by the the Delta fin keel punch. We then hammered into place the Attune RP trial femoral component, drilled the lugs, inserted a  5 mm trial bearing, trial patellar button, and took the knee through range of motion from 0-130 degrees. No thumb pressure was required for patellar Tracking. At this point, the limb was wrapped with an Esmarch bandage and the tourniquet inflated to 350 mmHg. All trial components were removed, mating surfaces irrigated with pulse lavage, and dried with suction and sponges. 10 cc of the Exparel solution was applied to the cancellus bone of the patella distal femur and proximal tibia.  After waiting 1 minute, the bony surfaces were again, dried with sponges. A double batch of DePuy HV cement with 1500 mg of Zinacef was mixed and applied to all bony metallic mating surfaces except for the posterior condyles of the femur itself. In order, we hammered into place the tibial tray and removed excess cement, the femoral component and removed excess cement. The final Attune RP bearing  was inserted, and the knee brought to full extension  with compression.  The patellar button was clamped into place, and excess cement  removed. While the cement cured the wound was irrigated out with normal saline solution pulse lavage. Ligament stability and patellar tracking were checked and found to be excellent. The parapatellar arthrotomy was closed with  running #1 Vicryl suture. The subcutaneous tissue with 0 and 2-0 undyed  Vicryl suture, and the skin with running 3-0 SQ vicryl. A dressing of Xeroform,  4 x 4, dressing sponges, Webril, and Ace wrap applied. The patient  awakened, and taken  to recovery room without difficulty.   Nestor Lewandowsky 08/11/2018, 3:44 PM

## 2018-08-11 NOTE — Progress Notes (Signed)
Assisted Dr. Hatchett with left, ultrasound guided, adductor canal block. Side rails up, monitors on throughout procedure. See vital signs in flow sheet. Tolerated Procedure well. 

## 2018-08-11 NOTE — Anesthesia Procedure Notes (Signed)
Date/Time: 08/11/2018 2:15 PM Performed by: Vanessa [place], CRNA Oxygen Delivery Method: Simple face mask

## 2018-08-11 NOTE — Anesthesia Procedure Notes (Signed)
Anesthesia Regional Block: Adductor canal block   Pre-Anesthetic Checklist: ,, timeout performed, Correct Patient, Correct Site, Correct Laterality, Correct Procedure, Correct Position, site marked, Risks and benefits discussed,  Surgical consent,  Pre-op evaluation,  At surgeon's request and post-op pain management  Laterality: Left and Lower  Prep: chloraprep       Needles:  Injection technique: Single-shot  Needle Type: Echogenic Stimulator Needle     Needle Length: 10cm  Needle Gauge: 21   Needle insertion depth: 2 cm   Additional Needles:   Procedures:,,,, ultrasound used (permanent image in chart),,,,  Narrative:  Start time: 08/11/2018 1:14 PM End time: 08/11/2018 1:22 PM Injection made incrementally with aspirations every 5 mL.  Performed by: Personally  Anesthesiologist: Leilani Able, MD

## 2018-08-11 NOTE — Interval H&P Note (Signed)
History and Physical Interval Note:  08/11/2018 1:47 PM  Harold Sandoval  has presented today for surgery, with the diagnosis of LEFT KNEE OSTEOARTHRITIS  The various methods of treatment have been discussed with the patient and family. After consideration of risks, benefits and other options for treatment, the patient has consented to  Procedure(s): LEFT TOTAL KNEE ARTHROPLASTY (Left) as a surgical intervention .  The patient's history has been reviewed, patient examined, no change in status, stable for surgery.  I have reviewed the patient's chart and labs.  Questions were answered to the patient's satisfaction.     Nestor Lewandowsky

## 2018-08-11 NOTE — Anesthesia Preprocedure Evaluation (Addendum)
Anesthesia Evaluation  Patient identified by MRN, date of birth, ID band Patient awake    Reviewed: Allergy & Precautions, NPO status , Patient's Chart, lab work & pertinent test results  History of Anesthesia Complications Negative for: history of anesthetic complications  Airway Mallampati: II  TM Distance: >3 FB Neck ROM: Full    Dental  (+) Poor Dentition, Missing, Caps, Dental Advisory Given   Pulmonary neg pulmonary ROS,    Pulmonary exam normal breath sounds clear to auscultation       Cardiovascular (-) anginanegative cardio ROS Normal cardiovascular exam Rhythm:Regular Rate:Normal  '17 stress: The study is normal, low risk study. 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   Neuro/Psych Mild memory issuesCVA (balance instability), Residual Symptoms    GI/Hepatic negative GI ROS, Neg liver ROS,   Endo/Other  diabetes, Oral Hypoglycemic Agents  Renal/GU negative Renal ROS     Musculoskeletal  (+) Arthritis , Osteoarthritis,    Abdominal Normal abdominal exam  (+)   Peds  Hematology negative hematology ROS (+)   Anesthesia Other Findings   Reproductive/Obstetrics                                                             Anesthesia Evaluation  Patient identified by MRN, date of birth, ID band Patient awake    Reviewed: Allergy & Precautions, NPO status , Patient's Chart, lab work & pertinent test results  History of Anesthesia Complications Negative for: history of anesthetic complications  Airway Mallampati: II  TM Distance: >3 FB Neck ROM: Full    Dental  (+) Poor Dentition, Missing, Caps, Dental Advisory Given   Pulmonary neg pulmonary ROS,    breath sounds clear to auscultation       Cardiovascular (-) angina Rhythm:Regular Rate:Normal  '17 stress: The study is normal, low risk study. 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    Neuro/Psych Mild memory issuesCVA (balance instability), Residual Symptoms    GI/Hepatic negative GI ROS, Neg liver ROS,   Endo/Other  diabetes (glu 112), Oral Hypoglycemic Agents  Renal/GU negative Renal ROS     Musculoskeletal  (+) Arthritis ,   Abdominal   Peds  Hematology Eliquis: last dose sat mar 3   Anesthesia Other Findings   Reproductive/Obstetrics                            Anesthesia Physical Anesthesia Plan  ASA: III  Anesthesia Plan: Spinal   Post-op Pain Management:    Induction:   PONV Risk Score and Plan: 1 and Ondansetron and Dexamethasone  Airway Management Planned: Natural Airway and Simple Face Mask  Additional Equipment:   Intra-op Plan:   Post-operative Plan:   Informed Consent: I have reviewed the patients History and Physical, chart, labs and discussed the procedure including the risks, benefits and alternatives for the proposed anesthesia with the patient or authorized representative who has indicated his/her understanding and acceptance.   Dental advisory given and Consent reviewed with POA  Plan Discussed with: CRNA and Surgeon  Anesthesia Plan Comments: (Plan routine monitors, SAB)        Anesthesia Quick Evaluation  Lab Results  Component Value Date   WBC 5.7 08/04/2018   HGB 14.7 08/04/2018   HCT  44.3 08/04/2018   MCV 88.6 08/04/2018   PLT 177 08/04/2018   Lab Results  Component Value Date   CREATININE 0.92 08/04/2018   BUN 21 08/04/2018   NA 144 08/04/2018   K 4.1 08/04/2018   CL 106 08/04/2018   CO2 30 08/04/2018    Anesthesia Physical  Anesthesia Plan  ASA: III  Anesthesia Plan: Spinal   Post-op Pain Management:  Regional for Post-op pain   Induction:   PONV Risk Score and Plan: 1 and Ondansetron and Dexamethasone  Airway Management Planned: Natural Airway, Simple Face Mask and Nasal Cannula  Additional Equipment:   Intra-op Plan:   Post-operative Plan:    Informed Consent: I have reviewed the patients History and Physical, chart, labs and discussed the procedure including the risks, benefits and alternatives for the proposed anesthesia with the patient or authorized representative who has indicated his/her understanding and acceptance.   Dental advisory given  Plan Discussed with: CRNA and Surgeon  Anesthesia Plan Comments:        Anesthesia Quick Evaluation

## 2018-08-11 NOTE — Transfer of Care (Signed)
Immediate Anesthesia Transfer of Care Note  Patient: Harold Sandoval  Procedure(s) Performed: LEFT TOTAL KNEE ARTHROPLASTY (Left Knee)  Patient Location: PACU  Anesthesia Type:Spinal  Level of Consciousness: awake  Airway & Oxygen Therapy: Patient Spontanous Breathing and Patient connected to face mask oxygen  Post-op Assessment: Report given to RN and Post -op Vital signs reviewed and stable  Post vital signs: Reviewed and stable  Last Vitals:  Vitals Value Taken Time  BP 124/68 08/11/2018  4:11 PM  Temp    Pulse    Resp 15 08/11/2018  4:13 PM  SpO2    Vitals shown include unvalidated device data.  Last Pain:  Vitals:   08/11/18 1109  TempSrc: Oral         Complications: No apparent anesthesia complications

## 2018-08-11 NOTE — Discharge Instructions (Signed)

## 2018-08-12 ENCOUNTER — Encounter (HOSPITAL_COMMUNITY): Payer: Self-pay | Admitting: Orthopedic Surgery

## 2018-08-12 LAB — CBC
HCT: 37.8 % — ABNORMAL LOW (ref 39.0–52.0)
Hemoglobin: 12.7 g/dL — ABNORMAL LOW (ref 13.0–17.0)
MCH: 29.8 pg (ref 26.0–34.0)
MCHC: 33.6 g/dL (ref 30.0–36.0)
MCV: 88.7 fL (ref 78.0–100.0)
Platelets: 192 10*3/uL (ref 150–400)
RBC: 4.26 MIL/uL (ref 4.22–5.81)
RDW: 12.9 % (ref 11.5–15.5)
WBC: 8.9 10*3/uL (ref 4.0–10.5)

## 2018-08-12 LAB — BASIC METABOLIC PANEL WITH GFR
Anion gap: 8 (ref 5–15)
BUN: 14 mg/dL (ref 8–23)
CO2: 27 mmol/L (ref 22–32)
Calcium: 9.1 mg/dL (ref 8.9–10.3)
Chloride: 107 mmol/L (ref 98–111)
Creatinine, Ser: 0.86 mg/dL (ref 0.61–1.24)
GFR calc Af Amer: >60 mL/min
GFR calc non Af Amer: >60 mL/min
Glucose, Bld: 199 mg/dL — ABNORMAL HIGH (ref 70–99)
Potassium: 3.6 mmol/L (ref 3.5–5.1)
Sodium: 142 mmol/L (ref 135–145)

## 2018-08-12 LAB — GLUCOSE, CAPILLARY
Glucose-Capillary: 169 mg/dL — ABNORMAL HIGH (ref 70–99)
Glucose-Capillary: 124 mg/dL — ABNORMAL HIGH (ref 70–99)
Glucose-Capillary: 185 mg/dL — ABNORMAL HIGH (ref 70–99)
Glucose-Capillary: 185 mg/dL — ABNORMAL HIGH (ref 70–99)

## 2018-08-12 NOTE — Anesthesia Postprocedure Evaluation (Signed)
Anesthesia Post Note  Patient: Harold Sandoval  Procedure(s) Performed: LEFT TOTAL KNEE ARTHROPLASTY (Left Knee)     Patient location during evaluation: PACU Anesthesia Type: Spinal Level of consciousness: oriented and awake and alert Pain management: pain level controlled Vital Signs Assessment: post-procedure vital signs reviewed and stable Respiratory status: spontaneous breathing, respiratory function stable and patient connected to nasal cannula oxygen Cardiovascular status: blood pressure returned to baseline and stable Postop Assessment: no headache, no backache and no apparent nausea or vomiting Anesthetic complications: no    Last Vitals:  Vitals:   08/12/18 0154 08/12/18 0644  BP: 129/65 (!) 103/53  Pulse: 60 79  Resp: 16 18  Temp: 36.4 C 36.7 C  SpO2: 100% 100%    Last Pain:  Vitals:   08/12/18 0722  TempSrc:   PainSc: 0-No pain                 Kyheem Bathgate S

## 2018-08-12 NOTE — Progress Notes (Signed)
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 Pt requiring min assist for steadying at this time and also reports 8/10 L knee pain.  Pt felt unable to perform LE exercises once returned to bed due to pain, RN notified.  Pt has not yet met therapy goals.   Follow Up Recommendations  Follow surgeon's recommendation for DC plan and follow-up therapies     Equipment Recommendations  None recommended by PT    Recommendations for Other Services       Precautions / Restrictions Precautions Precautions: Knee;Fall Restrictions Other Position/Activity Restrictions: WBAT    Mobility  Bed Mobility Overal bed mobility: Needs Assistance Bed Mobility: Supine to Sit;Sit to Supine     Supine to sit: Min guard;HOB elevated Sit to supine: Min guard   General bed mobility comments: verbal cues for technique, pt self assists L LE  Transfers Overall transfer level: Needs assistance Equipment used: Rolling walker (2 wheeled) Transfers: Sit to/from Stand Sit to Stand: Min assist;From elevated surface         General transfer comment: assist to rise and steady, cues for UE and LE positioning  Ambulation/Gait Ambulation/Gait assistance: Min assist Gait Distance (Feet): 180 Feet Assistive device: Rolling walker (2 wheeled) Gait Pattern/deviations: Step-to pattern;Decreased stance time - left;Antalgic     General Gait Details: verbal cues for sequence, RW positioning, posture, step length, occasional steadying assist   Stairs Stairs: Yes Stairs assistance: Min assist Stair Management:  Step to pattern;Backwards;With walker Number of Stairs: 2 General stair comments: verbal cues for sequence, safety, RW positioning; assist for steadying required   Wheelchair Mobility    Modified Rankin (Stroke Patients Only)       Balance                                            Cognition Arousal/Alertness: Awake/alert Behavior During Therapy: WFL for tasks assessed/performed Overall Cognitive Status: Within Functional Limits for tasks assessed                                        Exercises      General Comments        Pertinent Vitals/Pain Pain Assessment: 0-10 Pain Score: 8  Pain Location: L knee Pain Descriptors / Indicators: Aching;Sore Pain Intervention(s): Limited activity within patient's tolerance;Repositioned;Monitored during session;Patient requesting pain meds-RN notified    Home Living Family/patient expects to be discharged to:: Private residence Living Arrangements: Spouse/significant other Available Help at Discharge: Friend(s) Type of Home: House Home Access: Stairs to enter Entrance Stairs-Rails: None Home Layout: One level Home Equipment: Environmental consultant - 2 wheels;Cane - single point;Bedside commode      Prior Function Level of Independence: Independent with assistive device(s)          PT Goals (current goals can now be found in the care plan section) Acute Rehab PT Goals PT Goal  Formulation: With patient Time For Goal Achievement: 08/17/18 Potential to Achieve Goals: Good Progress towards PT goals: Progressing toward goals    Frequency    7X/week      PT Plan Current plan remains appropriate    Co-evaluation              AM-PAC PT "6 Clicks" Daily Activity  Outcome Measure  Difficulty turning over in bed (including adjusting bedclothes, sheets and blankets)?: A Little Difficulty moving from lying on back to sitting on the side of the bed? : A Lot Difficulty sitting down on and standing  up from a chair with arms (Sandoval.g., wheelchair, bedside commode, etc,.)?: Unable Help needed moving to and from a bed to chair (including a wheelchair)?: A Little Help needed walking in hospital room?: A Little Help needed climbing 3-5 steps with a railing? : A Little 6 Click Score: 15    End of Session Equipment Utilized During Treatment: Gait belt Activity Tolerance: Patient limited by pain Patient left: in bed;with call bell/phone within reach;with family/visitor present Nurse Communication: Mobility status;Patient requests pain meds PT Visit Diagnosis: Other abnormalities of gait and mobility (R26.89)     Time: 1440-1505 PT Time Calculation (min) (ACUTE ONLY): 25 min  Charges:  $Gait Training: 23-37 mins                     Carmelia Bake, PT, DPT 08/12/2018 Pager: [phone]  Harold Sandoval 08/12/2018, 3:19 PM

## 2018-08-12 NOTE — Progress Notes (Signed)
Care Plan Notes 05/14/2018 to 08/12/2018       Care Plan by Shauna Hugh at 08/06/2018 11:55 AM    Date of Service   Author Author Type Status Note Type File Time  08/06/2018  Shauna Hugh  Signed Care Plan 08/06/2018             Spoke with patient's SO prior to surgery. He will discharge to home with family and the following arrangements:   DME:  Has all equipment from [facility] in March  HHPT:  [facility] - referral made     Contact numbers - [phone]  Fax # [phone]. Please fax discharge instructions to them at discharge.  OPPT:  [facility] [place] 08/26/18 @ 1000  MD follow up with Dr Turner Daniels: TDB by the office.   Please contact Shauna Hugh, RNCM [phone] with questions or if this plan should need to change.

## 2018-08-12 NOTE — Evaluation (Signed)
Physical Therapy Evaluation Patient Details Name: Harold Sandoval MRN: [id number] DOB: 1941/10/26 Today's Date: 08/12/2018   History of Present Illness  Pt is a 77 year old male s/p L TKA with hx of L THA 02/2018, macular degeneration, CVA, DMII, balance issues, and peripheral neuropathy  Clinical Impression   Pt is s/p TKA resulting in the deficits listed below (see PT Problem List).  Pt will benefit from skilled PT to increase their independence and safety with mobility to allow discharge to the venue listed below.  Pt assisted with ambulating short distance in hallway however limited by pain.  Pt reports plan to d/c home with HHPT likely tomorrow.     Follow Up Recommendations Follow surgeon's recommendation for DC plan and follow-up therapies    Equipment Recommendations  None recommended by PT    Recommendations for Other Services       Precautions / Restrictions Precautions Precautions: Knee Restrictions Other Position/Activity Restrictions: WBAT      Mobility  Bed Mobility Overal bed mobility: Needs Assistance Bed Mobility: Supine to Sit;Sit to Supine     Supine to sit: Min guard;HOB elevated Sit to supine: Min guard   General bed mobility comments: verbal cues for technique, pt self assists L LE  Transfers Overall transfer level: Needs assistance Equipment used: Rolling walker (2 wheeled) Transfers: Sit to/from Stand Sit to Stand: Min assist;From elevated surface         General transfer comment: assist to rise and steady, cues for UE and LE positioning  Ambulation/Gait Ambulation/Gait assistance: Min guard Gait Distance (Feet): 60 Feet Assistive device: Rolling walker (2 wheeled) Gait Pattern/deviations: Step-to pattern;Decreased stance time - left;Antalgic     General Gait Details: verbal cues for sequence, RW positioning, posture, step length, distance to tolerance  Stairs            Wheelchair Mobility    Modified Rankin (Stroke Patients  Only)       Balance                                             Pertinent Vitals/Pain Pain Assessment: 0-10 Pain Score: 7  Pain Location: L knee Pain Descriptors / Indicators: Aching;Sore Pain Intervention(s): Limited activity within patient's tolerance;Monitored during session;Repositioned;Patient requesting pain meds-RN notified    Home Living Family/patient expects to be discharged to:: Private residence Living Arrangements: Spouse/significant other Available Help at Discharge: Friend(s) Type of Home: House Home Access: Stairs to enter Entrance Stairs-Rails: None Entrance Stairs-Number of Steps: 1 Home Layout: One level Home Equipment: Environmental consultant - 2 wheels;Cane - single point;Bedside commode      Prior Function Level of Independence: Independent with assistive device(s)               Hand Dominance        Extremity/Trunk Assessment        Lower Extremity Assessment Lower Extremity Assessment: LLE deficits/detail LLE Deficits / Details: good quad contraction, pt self assist L LE, ROM TBA       Communication   Communication: No difficulties  Cognition Arousal/Alertness: Awake/alert Behavior During Therapy: WFL for tasks assessed/performed Overall Cognitive Status: Within Functional Limits for tasks assessed  General Comments      Exercises     Assessment/Plan    PT Assessment Patient needs continued PT services  PT Problem List Decreased strength;Decreased range of motion;Decreased mobility;Decreased knowledge of precautions;Decreased activity tolerance;Decreased knowledge of use of DME;Pain       PT Treatment Interventions Stair training;Gait training;Therapeutic exercise;Patient/family education;Therapeutic activities;DME instruction;Functional mobility training    PT Goals (Current goals can be found in the Care Plan section)  Acute Rehab PT Goals PT Goal Formulation:  With patient Time For Goal Achievement: 08/17/18 Potential to Achieve Goals: Good    Frequency 7X/week   Barriers to discharge        Co-evaluation               AM-PAC PT "6 Clicks" Daily Activity  Outcome Measure Difficulty turning over in bed (including adjusting bedclothes, sheets and blankets)?: A Lot Difficulty moving from lying on back to sitting on the side of the bed? : Unable Difficulty sitting down on and standing up from a chair with arms (e.g., wheelchair, bedside commode, etc,.)?: Unable Help needed moving to and from a bed to chair (including a wheelchair)?: A Little Help needed walking in hospital room?: A Little Help needed climbing 3-5 steps with a railing? : A Lot 6 Click Score: 12    End of Session Equipment Utilized During Treatment: Gait belt Activity Tolerance: Patient limited by pain Patient left: in bed;with call bell/phone within reach;with family/visitor present Nurse Communication: Mobility status;Patient requests pain meds PT Visit Diagnosis: Other abnormalities of gait and mobility (R26.89)    Time: 4193-7902 PT Time Calculation (min) (ACUTE ONLY): 19 min   Charges:   PT Evaluation $PT Eval Low Complexity: 1 Low         Zenovia Jarred, PT, DPT 08/12/2018 Pager: [phone]  Maida Sale E 08/12/2018, 12:15 PM

## 2018-08-12 NOTE — Progress Notes (Signed)
Bladder scan at 2:07 was 456. I&O at 23:10 yielded 850 of clear yellow urine. Patient refused I&O at this time.

## 2018-08-12 NOTE — Progress Notes (Signed)
PATIENT ID: Harold Sandoval  MRN: [id number]  DOB/AGE:  1941/05/15 / 77 y.o.  1 Day Post-Op Procedure(s) (LRB): LEFT TOTAL KNEE ARTHROPLASTY (Left)    PROGRESS NOTE Subjective: Patient is alert, oriented, no Nausea, no Vomiting, yes passing gas. Taking PO well. Denies SOB, Chest or Calf Pain. Using Incentive Spirometer, PAS in place. Ambulate WBAT with pt not yet working with therapy, Patient reports pain as 3/10 .    Objective: Vital signs in last 24 hours: Vitals:   08/11/18 2008 08/11/18 2100 08/12/18 0154 08/12/18 0644  BP: (!) 143/80 (!) 169/89 129/65 (!) 103/53  Pulse: 70 71 60 79  Resp: 15 17 16 18   Temp: 97.9 F (36.6 C) 98.4 F (36.9 C) 97.6 F (36.4 C) 98 F (36.7 C)  TempSrc: Oral Oral Oral Oral  SpO2: 100% 100% 100% 100%  Weight:      Height:          Intake/Output from previous day: I/O last 3 completed shifts: In: 4124.2 [P.O.:1272; I.V.:2652.2; IV Piggyback:200] Out: 2450 [Urine:2325; Blood:125]   Intake/Output this shift: Total I/O In: -  Out: 200 [Urine:200]   LABORATORY DATA: Recent Labs    08/11/18 1829 08/11/18 2133 08/12/18 0514 08/12/18 0737  WBC  --   --  8.9  --   HGB  --   --  12.7*  --   HCT  --   --  37.8*  --   PLT  --   --  192  --   NA  --   --  142  --   K  --   --  3.6  --   CL  --   --  107  --   CO2  --   --  27  --   BUN  --   --  14  --   CREATININE  --   --  0.86  --   GLUCOSE  --   --  199*  --   GLUCAP 139* 248*  --  169*  CALCIUM  --   --  9.1  --     Examination: Neurologically intact Neurovascular intact Sensation intact distally Intact pulses distally Dorsiflexion/Plantar flexion intact Incision: dressing C/D/I and no drainage No cellulitis present Compartment soft}  Assessment:   1 Day Post-Op Procedure(s) (LRB): LEFT TOTAL KNEE ARTHROPLASTY (Left) ADDITIONAL DIAGNOSIS: Expected Acute Blood Loss Anemia, Diabetes Anticipated LOS equal to or greater than 2 midnights due to - Age 7 and older with one or  more of the following:  - Obesity  - Expected need for hospital services (PT, OT, Nursing) required for safe  discharge  - Anticipated need for postoperative skilled nursing care or inpatient rehab  - Active co-morbidities: Diabetes     Plan: PT/OT WBAT, AROM and PROM  DVT Prophylaxis:  SCDx72hrs, ASA 81 mg BID x 2 weeks DISCHARGE PLAN: Home, when pt passes therapy goals DISCHARGE NEEDS: HHPT, Walker and 3-in-1 comode seat     Dannielle Burn 08/12/2018, 8:21 AM

## 2018-08-13 LAB — CBC
HCT: 36.5 % — ABNORMAL LOW (ref 39.0–52.0)
Hemoglobin: 12.1 g/dL — ABNORMAL LOW (ref 13.0–17.0)
MCH: 29.6 pg (ref 26.0–34.0)
MCHC: 33.2 g/dL (ref 30.0–36.0)
MCV: 89.2 fL (ref 78.0–100.0)
Platelets: 189 10*3/uL (ref 150–400)
RBC: 4.09 MIL/uL — ABNORMAL LOW (ref 4.22–5.81)
RDW: 13 % (ref 11.5–15.5)
WBC: 11.3 10*3/uL — ABNORMAL HIGH (ref 4.0–10.5)

## 2018-08-13 LAB — GLUCOSE, CAPILLARY
Glucose-Capillary: 97 mg/dL (ref 70–99)
Glucose-Capillary: 243 mg/dL — ABNORMAL HIGH (ref 70–99)

## 2018-08-13 NOTE — Plan of Care (Signed)
Patient discharged home. Discharge instructions given to patient and wife, both verbalized understanding. IV dc'd. Rx given.

## 2018-08-13 NOTE — Progress Notes (Signed)
PATIENT ID: Harold Sandoval  MRN: [id number]  DOB/AGE:  May 17, 1941 / 77 y.o.  2 Days Post-Op Procedure(s) (LRB): LEFT TOTAL KNEE ARTHROPLASTY (Left)    PROGRESS NOTE Subjective: Patient is alert, oriented, no Nausea, no Vomiting, yes passing gas. Taking PO well. Denies SOB, Chest or Calf Pain. Using Incentive Spirometer, PAS in place. Ambulate WBAT with pt walking 185 ft with therapy, Patient reports pain as mild at rest and moderate to sever with activity .    Objective: Vital signs in last 24 hours: Vitals:   08/12/18 1116 08/12/18 1426 08/12/18 2144 08/13/18 0502  BP: 119/67 129/67 (!) 95/47 135/72  Pulse: (!) 59 67 61 60  Resp: 16 15 16    Temp: 98.2 F (36.8 C) 97.9 F (36.6 C) 98.2 F (36.8 C) 97.9 F (36.6 C)  TempSrc: Oral Oral Oral Oral  SpO2: 100% 97% 97% 97%  Weight:      Height:          Intake/Output from previous day: I/O last 3 completed shifts: In: 4217.2 [P.O.:2410; I.V.:1807.2] Out: 3475 [Urine:3475]   Intake/Output this shift: No intake/output data recorded.   LABORATORY DATA: Recent Labs    08/12/18 0514  08/12/18 1759 08/12/18 2210 08/13/18 0454 08/13/18 0751  WBC 8.9  --   --   --  11.3*  --   HGB 12.7*  --   --   --  12.1*  --   HCT 37.8*  --   --   --  36.5*  --   PLT 192  --   --   --  189  --   NA 142  --   --   --   --   --   K 3.6  --   --   --   --   --   CL 107  --   --   --   --   --   CO2 27  --   --   --   --   --   BUN 14  --   --   --   --   --   CREATININE 0.86  --   --   --   --   --   GLUCOSE 199*  --   --   --   --   --   GLUCAP  --    < > 185* 185*  --  97  CALCIUM 9.1  --   --   --   --   --    < > = values in this interval not displayed.    Examination: Neurologically intact Neurovascular intact Sensation intact distally Intact pulses distally Dorsiflexion/Plantar flexion intact Incision: dressing C/D/I and no drainage No cellulitis present Compartment soft}  Assessment:   2 Days Post-Op Procedure(s)  (LRB): LEFT TOTAL KNEE ARTHROPLASTY (Left) ADDITIONAL DIAGNOSIS: Expected Acute Blood Loss Anemia, Diabetes Anticipated LOS equal to or greater than 2 midnights due to - Age 79 and older with one or more of the following:  - Obesity  - Expected need for hospital services (PT, OT, Nursing) required for safe  discharge  - Anticipated need for postoperative skilled nursing care or inpatient rehab  - Active co-morbidities: Diabetes    Plan: PT/OT WBAT, AROM and PROM  DVT Prophylaxis:  SCDx72hrs, ASA 81 mg BID x 2 weeks DISCHARGE PLAN: Home DISCHARGE NEEDS: HHPT Pt has follow up appt with Dr. Turner Daniels on 9/16 at 9:15    Harold Sandoval  Harold Sandoval 08/13/2018, 8:57 AM

## 2018-08-13 NOTE — Discharge Summary (Signed)
Patient ID: Harold Sandoval MRN: [id number] DOB/AGE: 1941/09/08 77 y.o.  Admit date: 08/11/2018 Discharge date: 08/13/2018  Admission Diagnoses:  Principal Problem:   Degenerative arthritis of left knee Active Problems:   Primary osteoarthritis of left knee   Discharge Diagnoses:  Same  Past Medical History:  Diagnosis Date  . Arthritis    "all over" (02/10/2018)  . Balance problem    when first stands.  Since stroke.  . Constipation   . 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 Degenerative joint disease (DJD) of hip   . Diabetic neuropathy (HCC)   . Diabetic neuropathy (HCC)    fingers  . 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 Glaucoma, both eyes   . High cholesterol   . History of gout   . 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 Macular degeneration, left eye   . Sinus headache    resolved  . Type II diabetes mellitus (HCC)     Surgeries: Procedure(s): LEFT TOTAL KNEE ARTHROPLASTY on 08/11/2018   Consultants:   Discharged Condition: Improved  Hospital Course: Harold Sandoval is an 77 y.o. male who was admitted 08/11/2018 for operative treatment ofDegenerative arthritis of left knee. Patient has severe unremitting pain that affects sleep, daily activities, and work/hobbies. After pre-op clearance the patient was taken to the operating room on 08/11/2018 and underwent  Procedure(s): LEFT TOTAL KNEE ARTHROPLASTY.    Patient was given perioperative antibiotics:  Anti-infectives (From admission, onward)   Start     Dose/Rate Route Frequency Ordered Stop   08/11/18 1130  ceFAZolin (ANCEF) IVPB 2g/100 mL premix     2 g 200 mL/hr over 30 Minutes Intravenous On call to O.R. 08/11/18 1127 08/11/18 1418       Patient was given sequential compression devices, early ambulation, and chemoprophylaxis to prevent DVT.  Patient benefited maximally from  hospital stay and there were no complications.    Recent vital signs:  Patient Vitals for the past 24 hrs:  BP Temp Temp src Pulse Resp SpO2  08/13/18 0502 135/72 97.9 F (36.6 C) Oral 60 - 97 %  08/12/18 2144 (!) 95/47 98.2 F (36.8 C) Oral 61 16 97 %  08/12/18 1426 129/67 97.9 F (36.6 C) Oral 67 15 97 %  08/12/18 1116 119/67 98.2 F (36.8 C) Oral (!) 59 16 100 %     Recent laboratory studies:  Recent Labs    08/12/18 0514 08/13/18 0454  WBC 8.9 11.3*  HGB 12.7* 12.1*  HCT 37.8* 36.5*  PLT 192 189  NA 142  --   K 3.6  --   CL 107  --   CO2 27  --   BUN 14  --   CREATININE 0.86  --   GLUCOSE 199*  --   CALCIUM 9.1  --      Discharge Medications:   Allergies as of 08/13/2018   No Known Allergies     Medication List    STOP taking these medications   acetaminophen 325 MG tablet Commonly known as:  TYLENOL   apixaban 2.5 MG Tabs tablet Commonly known as:  ELIQUIS   aspirin 81 MG chewable tablet Replaced by:  aspirin EC 81 MG tablet   traMADol 50 MG tablet Commonly known as:  ULTRAM     TAKE these medications   aspirin EC 81 MG tablet Take 1 tablet (  81 mg total) by mouth 2 (two) times daily. Replaces:  aspirin 81 MG chewable tablet   atorvastatin 80 MG tablet Commonly known as:  LIPITOR Take 80 mg by mouth every evening.   brimonidine 0.2 % ophthalmic solution Commonly known as:  ALPHAGAN Place 1 drop into both eyes 2 (two) times daily.   brinzolamide 1 % ophthalmic suspension Commonly known as:  AZOPT Place 1 drop into both eyes 2 (two) times daily.   desonide 0.05 % lotion Commonly known as:  DESOWEN Apply 1 application topically 2 (two) times daily.   fluticasone 50 MCG/ACT nasal spray Commonly known as:  FLONASE Place 2 sprays into both nostrils daily as needed for rhinitis.   indomethacin 75 MG CR capsule Commonly known as:  INDOCIN SR Take 75 mg by mouth 2 (two) times daily as needed for pain.   latanoprost 0.005 % ophthalmic  solution Commonly known as:  XALATAN Place 1 drop into both eyes at bedtime.   metFORMIN 500 MG 24 hr tablet Commonly known as:  GLUCOPHAGE-XR Take 500 mg by mouth 2 (two) times daily.   oxyCODONE-acetaminophen 5-325 MG tablet Commonly known as:  PERCOCET/ROXICET Take 1 tablet by mouth every 4 (four) hours as needed for severe pain.   senna 8.6 MG tablet Commonly known as:  SENOKOT Take 2 tablets by mouth daily as needed for constipation.   tiZANidine 2 MG tablet Commonly known as:  ZANAFLEX Take 1 tablet (2 mg total) by mouth every 6 (six) hours as needed. What changed:  reasons to take this   Vitamin D3 2000 units capsule Take 2,000 Units by mouth daily.            Durable Medical Equipment  (From admission, onward)         Start     Ordered   08/11/18 1805  DME Walker rolling  Once    Question:  Patient needs a walker to treat with the following condition  Answer:  Status post total left knee replacement   08/11/18 1804   08/11/18 1805  DME 3 n 1  Once     08/11/18 1804           Discharge Care Instructions  (From admission, onward)         Start     Ordered   08/13/18 0000  Weight bearing as tolerated     08/13/18 0900          Diagnostic Studies: No results found.  Disposition: Discharge disposition: 01-Home or Self Care       Discharge Instructions    Call MD / Call 911   Complete by:  As directed    If you experience chest pain or shortness of breath, CALL 911 and be transported to the hospital emergency room.  If you develope a fever above 101 F, pus (white drainage) or increased drainage or redness at the wound, or calf pain, call your surgeon's office.   Constipation Prevention   Complete by:  As directed    Drink plenty of fluids.  Prune juice may be helpful.  You may use a stool softener, such as Colace (over the counter) 100 mg twice a day.  Use MiraLax (over the counter) for constipation as needed.   Diet - low sodium heart  healthy   Complete by:  As directed    Driving restrictions   Complete by:  As directed    No driving for 2 weeks   Increase activity slowly  as tolerated   Complete by:  As directed    Patient may shower   Complete by:  As directed    You may shower without a dressing once there is no drainage.  Do not wash over the wound.  If drainage remains, cover wound with plastic wrap and then shower.   Weight bearing as tolerated   Complete by:  As directed       Follow-up Information    Gean Birchwood, MD In 2 weeks.   Specialty:  Orthopedic Surgery Contact information: [address] [place] [address] [phone]        Jobe Gibbon, [facility] Follow up.   Specialty:  Home Health Services Contact information: [address] [place] [address] [phone]            Signed: Dannielle Burn 08/13/2018, 9:00 AM

## 2018-08-13 NOTE — Progress Notes (Signed)
Physical Therapy Treatment Patient Details Name: Harold Sandoval MRN: [id number] DOB: 19-Aug-1941 Today's Date: 08/13/2018    History of Present Illness Pt is a 77 year old male s/p L TKA with hx of L THA 02/2018, macular degeneration, CVA, DMII, balance issues, and peripheral neuropathy    PT Comments    Pt performed LE exercises in bed and provided with HEP handout.  Pt also ambulated good distance in hallway and practiced steps again.  Spouse was sleeping however awake end of session so verbally reviewed exercises with spouse.  Plan to return and ambulate and practice steps once more prior to d/c per spouse and pt request.    Follow Up Recommendations  Follow surgeon's recommendation for DC plan and follow-up therapies     Equipment Recommendations  None recommended by PT    Recommendations for Other Services       Precautions / Restrictions Precautions Precautions: Knee;Fall Restrictions Weight Bearing Restrictions: No Other Position/Activity Restrictions: WBAT    Mobility  Bed Mobility Overal bed mobility: Needs Assistance Bed Mobility: Supine to Sit;Sit to Supine     Supine to sit: Min guard;HOB elevated Sit to supine: Min guard   General bed mobility comments: verbal cues for technique, pt self assists L LE  Transfers Overall transfer level: Needs assistance Equipment used: Rolling walker (2 wheeled) Transfers: Sit to/from Stand Sit to Stand: Min guard         General transfer comment: cues for UE and LE positioning  Ambulation/Gait Ambulation/Gait assistance: Min guard Gait Distance (Feet): 200 Feet Assistive device: Rolling walker (2 wheeled) Gait Pattern/deviations: Step-to pattern;Decreased stance time - left;Antalgic     General Gait Details: verbal cues for sequence, RW positioning, posture, step length   Stairs Stairs: Yes Stairs assistance: Min guard Stair Management: Step to pattern;Backwards;With walker Number of Stairs: 2 General stair  comments: verbal cues for sequence, safety, RW positioning; pt unable to recall correctly from yesterday so increased cues provided   Wheelchair Mobility    Modified Rankin (Stroke Patients Only)       Balance                                            Cognition Arousal/Alertness: Awake/alert Behavior During Therapy: WFL for tasks assessed/performed Overall Cognitive Status: Within Functional Limits for tasks assessed                                        Exercises Total Joint Exercises Ankle Circles/Pumps: AROM;Both;10 reps Quad Sets: AROM;10 reps;Both Short Arc Quad: 10 reps;Left;AAROM Heel Slides: AAROM;10 reps;Left Hip ABduction/ADduction: AROM;10 reps;Left Straight Leg Raises: AAROM;Left Goniometric ROM: L knee approx -3-90* AAROM supine    General Comments        Pertinent Vitals/Pain Pain Assessment: 0-10 Pain Score: 4  Pain Location: L knee Pain Descriptors / Indicators: Aching;Sore Pain Intervention(s): Limited activity within patient's tolerance;Repositioned;Monitored during session;Premedicated before session    Home Living                      Prior Function            PT Goals (current goals can now be found in the care plan section) Progress towards PT goals: Progressing toward goals    Frequency  7X/week      PT Plan Current plan remains appropriate    Co-evaluation              AM-PAC PT "6 Clicks" Daily Activity  Outcome Measure  Difficulty turning over in bed (including adjusting bedclothes, sheets and blankets)?: A Little Difficulty moving from lying on back to sitting on the side of the bed? : A Little Difficulty sitting down on and standing up from a chair with arms (e.g., wheelchair, bedside commode, etc,.)?: A Little Help needed moving to and from a bed to chair (including a wheelchair)?: A Little Help needed walking in hospital room?: A Little Help needed climbing 3-5  steps with a railing? : A Little 6 Click Score: 18    End of Session Equipment Utilized During Treatment: Gait belt Activity Tolerance: Patient tolerated treatment well Patient left: in bed;with call bell/phone within reach;with family/visitor present;with bed alarm set   PT Visit Diagnosis: Other abnormalities of gait and mobility (R26.89)     Time: 1020-1045 PT Time Calculation (min) (ACUTE ONLY): 25 min  Charges:  $Gait Training: 8-22 mins $Therapeutic Exercise: 8-22 mins                     Zenovia Jarred, PT, DPT 08/13/2018 Pager: [phone]  Maida Sale E 08/13/2018, 12:00 PM

## 2018-08-13 NOTE — Care Plan (Signed)
Met with patient and wife at bedside with PA on rounds. Doing well. Planning for discharge today. ACE wrap removed. Will have thigh high TEDS applied prior to discharge. Questions answered. Discharge plans in place. Has DME at home.  MD appt and OPPT reminders given to patient   Thanks

## 2018-08-13 NOTE — Progress Notes (Signed)
Physical Therapy Treatment Patient Details Name: Harold Sandoval MRN: [id number] DOB: [birth year] Today's Date: 08/13/2018    History of Present Illness Pt is a 77 year old male s/p L TKA with hx of L THA 02/2018, macular degeneration, CVA, DMII, balance issues, and peripheral neuropathy    PT Comments    Pt ambulated in hallway and practiced safe stair technique with spouse.  Pt and spouse report understanding.  HEP and stair handouts provided this morning.  Pt feels ready for d/c home today.   Follow Up Recommendations  Follow surgeon's recommendation for DC plan and follow-up therapies     Equipment Recommendations  None recommended by PT    Recommendations for Other Services       Precautions / Restrictions Precautions Precautions: Knee;Fall Restrictions Other Position/Activity Restrictions: WBAT    Mobility  Bed Mobility Overal bed mobility: Needs Assistance Bed Mobility: Supine to Sit;Sit to Supine     Supine to sit: Supervision Sit to supine: Supervision   General bed mobility comments: verbal cues for technique, pt self assists L LE  Transfers Overall transfer level: Needs assistance Equipment used: Rolling walker (2 wheeled) Transfers: Sit to/from Stand Sit to Stand: Min guard;Supervision         General transfer comment: cues for UE and LE positioning  Ambulation/Gait Ambulation/Gait assistance: Min guard;Supervision Gait Distance (Feet): 180 Feet Assistive device: Rolling walker (2 wheeled) Gait Pattern/deviations: Step-to pattern;Decreased stance time - left;Antalgic;Step-through pattern     General Gait Details: verbal cues for sequence, RW positioning, posture, step length; steadiness improved this afternoon   Stairs Stairs: Yes Stairs assistance: Min guard Stair Management: Step to pattern;Backwards;With walker Number of Stairs: 2 General stair comments: verbal cues for sequence, safety, RW positioning; spouse present and educated; spouse  held RW for pt, performed twice   Wheelchair Mobility    Modified Rankin (Stroke Patients Only)       Balance                                            Cognition Arousal/Alertness: Awake/alert Behavior During Therapy: WFL for tasks assessed/performed Overall Cognitive Status: Within Functional Limits for tasks assessed                                           General Comments        Pertinent Vitals/Pain Pain Assessment: 0-10 Pain Score: 3  Pain Location: L knee Pain Descriptors / Indicators: Aching;Sore Pain Intervention(s): Limited activity within patient's tolerance;Repositioned;Monitored during session    Home Living                      Prior Function            PT Goals (current goals can now be found in the care plan section) Progress towards PT goals: Progressing toward goals    Frequency    7X/week      PT Plan Current plan remains appropriate    Co-evaluation              AM-PAC PT "6 Clicks" Daily Activity  Outcome Measure  Difficulty turning over in bed (including adjusting bedclothes, sheets and blankets)?: A Little Difficulty moving from lying on back to sitting on the side of  the bed? : A Little Difficulty sitting down on and standing up from a chair with arms (e.g., wheelchair, bedside commode, etc,.)?: A Little Help needed moving to and from a bed to chair (including a wheelchair)?: A Little Help needed walking in hospital room?: A Little Help needed climbing 3-5 steps with a railing? : A Little 6 Click Score: 18    End of Session Equipment Utilized During Treatment: Gait belt Activity Tolerance: Patient tolerated treatment well Patient left: in bed;with call bell/phone within reach;with family/visitor present Nurse Communication: Mobility status PT Visit Diagnosis: Other abnormalities of gait and mobility (R26.89)     Time: 4235-3614 PT Time Calculation (min) (ACUTE ONLY):  12 min  Charges:  $Gait Training: 8-22 mins                     Zenovia Jarred, PT, DPT 08/13/2018 Pager: [phone]  Maida Sale E 08/13/2018, 3:00 PM

## 2018-09-06 ENCOUNTER — Encounter: Payer: Self-pay | Admitting: Emergency Medicine

## 2018-09-06 ENCOUNTER — Emergency Department: Payer: Medicare HMO

## 2018-09-06 ENCOUNTER — Inpatient Hospital Stay
Admission: EM | Admit: 2018-09-06 | Discharge: 2018-09-07 | DRG: 066 | Disposition: A | Discharge status: Home / Self Care | Payer: Medicare HMO | Attending: Internal Medicine | Admitting: Internal Medicine

## 2018-09-06 ENCOUNTER — Other Ambulatory Visit: Payer: Self-pay

## 2018-09-06 DIAGNOSIS — E1140 Type 2 diabetes mellitus with diabetic neuropathy, unspecified: Secondary | ICD-10-CM | POA: Diagnosis present

## 2018-09-06 DIAGNOSIS — I639 Cerebral infarction, unspecified: Secondary | ICD-10-CM | POA: Diagnosis present

## 2018-09-06 DIAGNOSIS — R29702 NIHSS score 2: Secondary | ICD-10-CM | POA: Diagnosis present

## 2018-09-06 DIAGNOSIS — Z96642 Presence of left artificial hip joint: Secondary | ICD-10-CM | POA: Diagnosis present

## 2018-09-06 DIAGNOSIS — I4891 Unspecified atrial fibrillation: Secondary | ICD-10-CM | POA: Diagnosis present

## 2018-09-06 DIAGNOSIS — Z96652 Presence of left artificial knee joint: Secondary | ICD-10-CM | POA: Diagnosis present

## 2018-09-06 DIAGNOSIS — R413 Other amnesia: Secondary | ICD-10-CM | POA: Diagnosis present

## 2018-09-06 DIAGNOSIS — H538 Other visual disturbances: Secondary | ICD-10-CM | POA: Diagnosis present

## 2018-09-06 DIAGNOSIS — E785 Hyperlipidemia, unspecified: Secondary | ICD-10-CM | POA: Diagnosis present

## 2018-09-06 DIAGNOSIS — I10 Essential (primary) hypertension: Secondary | ICD-10-CM | POA: Diagnosis present

## 2018-09-06 DIAGNOSIS — Z7982 Long term (current) use of aspirin: Secondary | ICD-10-CM | POA: Diagnosis not present

## 2018-09-06 DIAGNOSIS — Z79891 Long term (current) use of opiate analgesic: Secondary | ICD-10-CM

## 2018-09-06 DIAGNOSIS — I6340 Cerebral infarction due to embolism of unspecified cerebral artery: Secondary | ICD-10-CM | POA: Diagnosis not present

## 2018-09-06 DIAGNOSIS — R410 Disorientation, unspecified: Secondary | ICD-10-CM

## 2018-09-06 DIAGNOSIS — Z79899 Other long term (current) drug therapy: Secondary | ICD-10-CM | POA: Diagnosis not present

## 2018-09-06 DIAGNOSIS — H409 Unspecified glaucoma: Secondary | ICD-10-CM | POA: Diagnosis present

## 2018-09-06 DIAGNOSIS — E7800 Pure hypercholesterolemia, unspecified: Secondary | ICD-10-CM | POA: Diagnosis present

## 2018-09-06 LAB — CBC
HCT: 43.3 % (ref 40.0–52.0)
Hemoglobin: 14.7 g/dL (ref 13.0–18.0)
MCH: 30.3 pg (ref 26.0–34.0)
MCHC: 34.1 g/dL (ref 32.0–36.0)
MCV: 88.8 fL (ref 80.0–100.0)
Platelets: 286 10*3/uL (ref 150–440)
RBC: 4.87 MIL/uL (ref 4.40–5.90)
RDW: 14.5 % (ref 11.5–14.5)
WBC: 5.7 10*3/uL (ref 3.8–10.6)

## 2018-09-06 LAB — GLUCOSE, CAPILLARY
Glucose-Capillary: 117 mg/dL — ABNORMAL HIGH (ref 70–99)
Glucose-Capillary: 119 mg/dL — ABNORMAL HIGH (ref 70–99)

## 2018-09-06 LAB — TROPONIN I: Troponin I: <0.03 ng/mL

## 2018-09-06 LAB — COMPREHENSIVE METABOLIC PANEL WITH GFR
ALT: 16 U/L (ref 0–44)
AST: 17 U/L (ref 15–41)
Albumin: 4.6 g/dL (ref 3.5–5.0)
Alkaline Phosphatase: 135 U/L — ABNORMAL HIGH (ref 38–126)
Anion gap: 8 (ref 5–15)
BUN: 11 mg/dL (ref 8–23)
CO2: 27 mmol/L (ref 22–32)
Calcium: 10.1 mg/dL (ref 8.9–10.3)
Chloride: 103 mmol/L (ref 98–111)
Creatinine, Ser: 0.78 mg/dL (ref 0.61–1.24)
GFR calc Af Amer: >60 mL/min
GFR calc non Af Amer: >60 mL/min
Glucose, Bld: 172 mg/dL — ABNORMAL HIGH (ref 70–99)
Potassium: 4.1 mmol/L (ref 3.5–5.1)
Sodium: 138 mmol/L (ref 135–145)
Total Bilirubin: 1.6 mg/dL — ABNORMAL HIGH (ref 0.3–1.2)
Total Protein: 6.9 g/dL (ref 6.5–8.1)

## 2018-09-06 LAB — LIPID PANEL
Cholesterol: 99 mg/dL (ref 0–200)
HDL: 31 mg/dL — ABNORMAL LOW
LDL Cholesterol: 58 mg/dL (ref 0–99)
Total CHOL/HDL Ratio: 3.2 ratio
Triglycerides: 52 mg/dL
VLDL: 10 mg/dL (ref 0–40)

## 2018-09-06 LAB — ETHANOL: Alcohol, Ethyl (B): <10 mg/dL

## 2018-09-06 LAB — AMMONIA: Ammonia: 9 umol/L (ref 9–35)

## 2018-09-06 MED ORDER — FLUTICASONE PROPIONATE 50 MCG/ACT NA SUSP
2.0000 | Freq: Every day | NASAL | Status: DC | PRN
  Filled 2018-09-06: qty 16

## 2018-09-06 MED ORDER — ACETAMINOPHEN 650 MG RE SUPP: 650.0000 mg | RECTAL | Status: DC | PRN

## 2018-09-06 MED ORDER — INFLUENZA VAC SPLIT HIGH-DOSE 0.5 ML IM SUSY
0.5000 mL | PREFILLED_SYRINGE | INTRAMUSCULAR | Status: DC
  Filled 2018-09-06: qty 0.5

## 2018-09-06 MED ORDER — PNEUMOCOCCAL VAC POLYVALENT 25 MCG/0.5ML IJ INJ: 0.5000 mL | INJECTION | INTRAMUSCULAR | Status: DC

## 2018-09-06 MED ORDER — TIZANIDINE HCL 2 MG PO TABS
2.0000 mg | ORAL_TABLET | Freq: Two times a day (BID) | ORAL | Status: DC
  Administered 2018-09-06 – 2018-09-07 (×2): 2 mg via ORAL
  Filled 2018-09-06 (×3): qty 1

## 2018-09-06 MED ORDER — ATORVASTATIN CALCIUM 20 MG PO TABS
80.0000 mg | ORAL_TABLET | Freq: Every evening | ORAL | Status: DC
  Administered 2018-09-06 – 2018-09-07 (×2): 80 mg via ORAL
  Filled 2018-09-06 (×2): qty 4

## 2018-09-06 MED ORDER — METFORMIN HCL 500 MG PO TABS
500.0000 mg | ORAL_TABLET | Freq: Two times a day (BID) | ORAL | Status: DC
  Administered 2018-09-06 – 2018-09-07 (×3): 500 mg via ORAL
  Filled 2018-09-06 (×3): qty 1

## 2018-09-06 MED ORDER — VITAMIN D 1000 UNITS PO TABS
2000.0000 [IU] | ORAL_TABLET | Freq: Every day | ORAL | Status: DC
  Administered 2018-09-07: 2000 [IU] via ORAL
  Filled 2018-09-06 (×3): qty 2

## 2018-09-06 MED ORDER — ASPIRIN EC 81 MG PO TBEC
81.0000 mg | DELAYED_RELEASE_TABLET | Freq: Two times a day (BID) | ORAL | Status: DC
  Administered 2018-09-06 – 2018-09-07 (×2): 81 mg via ORAL
  Filled 2018-09-06 (×2): qty 1

## 2018-09-06 MED ORDER — ENOXAPARIN SODIUM 40 MG/0.4ML ~~LOC~~ SOLN: 40.0000 mg | SUBCUTANEOUS | Status: DC

## 2018-09-06 MED ORDER — BRINZOLAMIDE 1 % OP SUSP
1.0000 [drp] | Freq: Two times a day (BID) | OPHTHALMIC | Status: DC
  Administered 2018-09-06 – 2018-09-07 (×2): 1 [drp] via OPHTHALMIC
  Filled 2018-09-06: qty 10

## 2018-09-06 MED ORDER — SODIUM CHLORIDE 0.9 % IV SOLN: Freq: Once | INTRAVENOUS | Status: DC

## 2018-09-06 MED ORDER — ACETAMINOPHEN 160 MG/5ML PO SOLN
650.0000 mg | ORAL | Status: DC | PRN
  Filled 2018-09-06: qty 20.3

## 2018-09-06 MED ORDER — INSULIN ASPART 100 UNIT/ML ~~LOC~~ SOLN: 0.0000 [IU] | Freq: Three times a day (TID) | SUBCUTANEOUS | Status: DC

## 2018-09-06 MED ORDER — BRIMONIDINE TARTRATE 0.2 % OP SOLN
1.0000 [drp] | Freq: Two times a day (BID) | OPHTHALMIC | Status: DC
  Administered 2018-09-06 – 2018-09-07 (×2): 1 [drp] via OPHTHALMIC
  Filled 2018-09-06: qty 5

## 2018-09-06 MED ORDER — OXYCODONE-ACETAMINOPHEN 5-325 MG PO TABS
1.0000 | ORAL_TABLET | ORAL | Status: DC | PRN
  Administered 2018-09-06: 1 via ORAL
  Filled 2018-09-06: qty 1

## 2018-09-06 MED ORDER — STROKE: EARLY STAGES OF RECOVERY BOOK: Freq: Once | Status: DC

## 2018-09-06 MED ORDER — ACETAMINOPHEN 325 MG PO TABS
650.0000 mg | ORAL_TABLET | ORAL | Status: DC | PRN
  Administered 2018-09-07: 13:00:00 650 mg via ORAL
  Filled 2018-09-06: qty 2

## 2018-09-06 MED ORDER — SENNA 8.6 MG PO TABS: 2.0000 | ORAL_TABLET | Freq: Every day | ORAL | Status: DC | PRN

## 2018-09-06 MED ORDER — LATANOPROST 0.005 % OP SOLN
1.0000 [drp] | Freq: Every day | OPHTHALMIC | Status: DC
  Administered 2018-09-06: 1 [drp] via OPHTHALMIC
  Filled 2018-09-06: qty 2.5

## 2018-09-06 NOTE — H&P (Signed)
[facility] [place] [facility]   PATIENT NAME: Harold Sandoval    MR#:  [id number]  DATE OF BIRTH:  [birth year]  DATE OF ADMISSION:  09/06/2018  PRIMARY CARE PHYSICIAN: Gracelyn Nurse, MD   REQUESTING/REFERRING PHYSICIAN: dr Alphonzo Lemmings  CHIEF COMPLAINT:   Left finger numbness, memory issue for few days  HISTORY OF PRESENT ILLNESS:  Harold Sandoval  is a 77 y.o. male with a known history of status post large left MCA territory stroke three years ago underwent IV TPA treatment with no neuro- deficit at present, hypertension, diabetes who underwent left knee total arthroplasty recently discharged on 96 2019 getting physical therapy at home comes to the emergency room after his girlfriend got concerned about patient having memory issues remembering telephone numbers date and month along with place and left finger numbness on and off for last couple days. Patient denies any fall. Denies any difficulty swallowing. He's been noticing some blurred vision which is been clearing of an intermittently getting blurred vision.  Patient has the Medtronic loop recorder which has been implanted for last several years to determine if he has any arrhythmias/atrial fibrillation. He follows with Dr. Lady Gary. He has been off eliquis since the loop recorder did not determine any arrhythmia. Patient takes aspirin 81 mg daily  CT head is negative for acute CVA. Old stroke changes are noted. He is being admitted for further evaluation on TIA versus CVA.  PAST MEDICAL HISTORY:   Past Medical History:  Diagnosis Date  . Arthritis    "all over" (02/10/2018)  . Balance problem    when first stands.  Since stroke.  . Constipation   . 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 Degenerative joint disease (DJD) of hip   . Diabetic neuropathy (HCC)   . Diabetic neuropathy (HCC)    fingers  . Diverticulosis of rectosigmoid 06/26/2016    multiple rectosigmoid colon noted on CT ABD/PELVIS  . Glaucoma, both eyes   . High cholesterol   . History of gout   . 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 Macular degeneration, left eye   . Sinus headache    resolved  . Type II diabetes mellitus (HCC)     PAST SURGICAL HISTOIRY:   Past Surgical History:  Procedure Laterality Date  . APPENDECTOMY    . BACK SURGERY    . CATARACT EXTRACTION, BILATERAL    . COLONOSCOPY WITH PROPOFOL N/A 12/29/2016   Procedure: COLONOSCOPY WITH PROPOFOL;  Surgeon: Midge Minium, MD;  Location: [facility];  Service: Endoscopy;  Laterality: N/A;  Diabetic - oral meds  . JOINT REPLACEMENT    . LAPAROSCOPIC CHOLECYSTECTOMY    . LOOP RECORDER INSERTION N/A 02/24/2017   Procedure: Loop Recorder Insertion;  Surgeon: Marcina Millard, MD;  Location: [facility];  Service: Cardiovascular;  Laterality: N/A;  . LUMBAR DISC SURGERY    . TOTAL HIP ARTHROPLASTY Left 02/10/2018   Procedure: TOTAL HIP ARTHROPLASTY ANTERIOR APPROACH;  Surgeon: Gean Birchwood, MD;  Location: [facility] OR;  Service: Orthopedics;  Laterality: Left;  . TOTAL KNEE ARTHROPLASTY Left 08/11/2018   Procedure: LEFT TOTAL KNEE ARTHROPLASTY;  Surgeon: Gean Birchwood, MD;  Location: [facility];  Service: Orthopedics;  Laterality: Left;    SOCIAL HISTORY:   Social History   Tobacco Use  . Smoking status: Never Smoker  . Smokeless tobacco: Never Used  Substance Use Topics  . Alcohol use:  No    Alcohol/week: 0.0 standard drinks    FAMILY HISTORY:   Family History  Problem Relation Age of Onset  . Cancer Mother   . Cancer Father     DRUG ALLERGIES:  No Known Allergies  REVIEW OF SYSTEMS:  Review of Systems  Constitutional: Negative for chills, fever and weight loss.  HENT: Negative for ear discharge, ear pain and nosebleeds.   Eyes: Negative for blurred vision, pain and discharge.  Respiratory: Negative for sputum production, shortness of breath, wheezing and stridor.    Cardiovascular: Negative for chest pain, palpitations, orthopnea and PND.  Gastrointestinal: Negative for abdominal pain, diarrhea, nausea and vomiting.  Genitourinary: Negative for frequency and urgency.  Musculoskeletal: Negative for back pain and joint pain.  Neurological: Negative for sensory change, speech change, focal weakness and weakness.  Psychiatric/Behavioral: Negative for depression and hallucinations. The patient is not nervous/anxious.      MEDICATIONS AT HOME:   Prior to Admission medications   Medication Sig Start Date End Date Taking? Authorizing Provider  aspirin EC 81 MG tablet Take 1 tablet (81 mg total) by mouth 2 (two) times daily. 08/11/18  Yes Dannielle Burn K, PA-C  atorvastatin (LIPITOR) 80 MG tablet Take 80 mg by mouth every evening.    Yes [provider]  brimonidine (ALPHAGAN) 0.2 % ophthalmic solution Place 1 drop into both eyes 2 (two) times daily.    Yes [provider]  brinzolamide (AZOPT) 1 % ophthalmic suspension Place 1 drop into both eyes 2 (two) times daily.   Yes [provider]  Cholecalciferol (VITAMIN D3) 2000 units capsule Take 2,000 Units by mouth daily.   Yes [provider]  desonide (DESOWEN) 0.05 % lotion Apply 1 application topically 2 (two) times daily.   Yes [provider]  fluticasone (FLONASE) 50 MCG/ACT nasal spray Place 2 sprays into both nostrils daily as needed for rhinitis.   Yes [provider]  indomethacin (INDOCIN SR) 75 MG CR capsule Take 75 mg by mouth 2 (two) times daily as needed for pain. 07/06/18  Yes [provider]  latanoprost (XALATAN) 0.005 % ophthalmic solution Place 1 drop into both eyes at bedtime.    Yes [provider]  metFORMIN (GLUCOPHAGE) 500 MG tablet Take 500 mg by mouth 2 (two) times daily with a meal. 08/03/18  Yes [provider]  oxyCODONE-acetaminophen (PERCOCET/ROXICET) 5-325 MG tablet Take 1 tablet by mouth every 4 (four)  hours as needed for severe pain. 08/11/18  Yes Allena Katz, PA-C  senna (SENOKOT) 8.6 MG tablet Take 2 tablets by mouth daily as needed for constipation.   Yes [provider]  tiZANidine (ZANAFLEX) 2 MG tablet Take 1 tablet (2 mg total) by mouth every 6 (six) hours as needed. Patient taking differently: Take 2 mg by mouth 2 (two) times daily.  08/11/18  Yes Allena Katz, PA-C      VITAL SIGNS:  Blood pressure (!) 153/83, pulse 80, temperature 98 F (36.7 C), temperature source Oral, resp. rate 15, height 6\' 4"  (1.93 m), weight 88.5 kg, SpO2 97 %.  PHYSICAL EXAMINATION:  GENERAL:  77 y.o.-year-old patient lying in the bed with no acute distress.  EYES: Pupils equal, round, reactive to light and accommodation. No scleral icterus. Extraocular muscles intact.  HEENT: Head atraumatic, normocephalic. Oropharynx and nasopharynx clear.  NECK:  Supple, no jugular venous distention. No thyroid enlargement, no tenderness.  LUNGS: Normal breath sounds bilaterally, no wheezing, rales,rhonchi or crepitation. No use  of accessory muscles of respiration.  CARDIOVASCULAR: S1, S2 normal. No murmurs, rubs, or gallops.  ABDOMEN: Soft, nontender, nondistended. Bowel sounds present. No organomegaly or mass.  EXTREMITIES: No pedal edema, cyanosis, or clubbing.  NEUROLOGIC: Cranial nerves II through XII are intact. Muscle strength 5/5 in all extremities.no focal weakness+, Sensation intact. Gait not checked.  PSYCHIATRIC: The patient is alert and oriented x 3.  SKIN: No obvious rash, lesion, or ulcer.   LABORATORY PANEL:   CBC Recent Labs  Lab 09/06/18 1234  WBC 5.7  HGB 14.7  HCT 43.3  PLT 286   ------------------------------------------------------------------------------------------------------------------  Chemistries  Recent Labs  Lab 09/06/18 1201  NA 138  K 4.1  CL 103  CO2 27  GLUCOSE 172*  BUN 11  CREATININE 0.78  CALCIUM 10.1  AST 17  ALT 16  ALKPHOS 135*  BILITOT  1.6*   ------------------------------------------------------------------------------------------------------------------  Cardiac Enzymes Recent Labs  Lab 09/06/18 1201  TROPONINI <0.03   ------------------------------------------------------------------------------------------------------------------  RADIOLOGY:  Ct Head Wo Contrast  Result Date: 09/06/2018 CLINICAL DATA:  Dizziness intermittent since yesterday. Intermittent L fingers numbness x 2 weeks. Smile symmetrical, grips and leg strength equal. States had stroke 3 years ago but did not leave him with weakness. History of L knee surgery September 6th this year. EXAM: CT HEAD WITHOUT CONTRAST TECHNIQUE: Contiguous axial images were obtained from the base of the skull through the vertex without intravenous contrast. COMPARISON:  None. FINDINGS: Brain: No evidence of acute infarction, hemorrhage, hydrocephalus, extra-axial collection or mass lesion/mass effect. There is a small area of encephalomalacia involving the posterior left frontal lobe consistent with an old MCA distribution infarct. Patchy areas of periventricular white matter hypoattenuation are noted bilaterally consistent with mild chronic microvascular ischemic change. Small old lacunar infarct in the left caudate nucleus head. Vascular: No hyperdense vessel or unexpected calcification. Skull: Normal. Negative for fracture or focal lesion. Sinuses/Orbits: Visualized globes and orbits are unremarkable. Visualized sinuses and mastoid air cells are clear. Other: None. IMPRESSION: 1. No acute intracranial abnormalities. 2. Old left MCA distribution infarct involving the posterior left frontal lobe. Mild chronic microvascular ischemic change. Electronically Signed   By: Amie Portland M.D.   On: 09/06/2018 14:28    EKG:    IMPRESSION AND PLAN:   Harold Sandoval  is a 77 y.o. male with a known history of status post large left MCA territory stroke three years ago underwent IV TPA  treatment with no neuro- deficit at present, hypertension, diabetes who underwent left knee total arthroplasty recently discharged on 96 2019 getting physical therapy at home comes to the emergency room after his girlfriend got concerned about patient having memory issues remembering telephone numbers date and month along with place and left finger numbness on and off for last couple days. Patient denies any fall. Denies any difficulty swallowing. He's been noticing some blurred vision which is been clearing of an intermittently getting blurred vision.  1. left finger numbness, memory loss, blurred vision intermittently suggestive of TIA versus CVA -and has history of large MCH surgery stroke three years ago no neural deficits from that stroke -continue aspirin 81 mg daily for now -MRI/MRA of the brain.--- Patient has a Medtronic loop recorder-- not sure if this is MRI compatible discussed with MRI tech-yohana -neurology consultation placed -echo, ultrasound carotid Doppler  2. HTN -allow permissive HTN -cont home meds  3. Recent left total knee arthroplasty -gets outpatient physical therapy--- can be resumed after discharge  4. Diabetes type II -sliding scale  insulin and home meds  Patient and friend in the room  All the records are reviewed and case discussed with ED provider. Management plans discussed with the patient, family and they are in agreement.  CODE STATUS: full  TOTAL TIME TAKING CARE OF THIS PATIENT: *45* minutes.    Enedina Finner M.D on 09/06/2018 at 3:39 PM  Between 7am to 6pm - Pager - [phone]  After 6pm go to [email or web address] - password [email or web address] [facility]  [facility]  Office  [phone]  CC: Primary care physician; Gracelyn Nurse, MD

## 2018-09-06 NOTE — Progress Notes (Signed)
Family Meeting Note  Patient came in with left upper extremity numbness mainly fingers along with memory loss and blurred vision intermittently. He has history of large MCA territory stroke about three years ago. History of hypertension and diabetes. Admitting forced TIA versus CVA. Discuss code status patient wishes to be full code. [name]  girlfriend was in the room.  Time 16 mins  Enedina Finner, MD

## 2018-09-06 NOTE — ED Triage Notes (Signed)
Dizziness intermittent since yesterday. Intermittent L fingers numbness x 2 weeks. Smile symmetrical, grips and leg strength equal. States had stroke 3 years ago but did not leave him with weakness. History of L knee surgery September 6th this year. Speech clear and coherent. Denies headache, chest pain or SOB at present.

## 2018-09-06 NOTE — Consult Note (Signed)
Referring Physician:     Chief Complaint: Left hand numbness and tingling, blurred vision and confusion  HPI: Harold Sandoval is an 77 y.o. 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  Per significant other who provides most history today, patient had knee surgery on September 4 and was discharged on September 6.  Due to needing assistance with ambulation he moved in with significant ither until Saturday when patient moved back to his residence.  Per significant other, that is the last time she saw him at baseline.  Patient's significant other states that she  called him on Sunday 09/05/2018 sometime between 9-10 pm to check on him and noticed that he was not his normal self.  Patient told her that he could not recall her telephone number which was very unusual for him.  Patient also complained of numbness and tingling sensation in his left finger and blurry vision. Significant other noticed that he appeared confused and had trouble telling time.  On arrival to the ED patient had initial stroke work-up including CT head  Which showed no acute intracranial abnormalities other than old left MCA stroke involving the posterior left frontal lobe.  Initial NIH stroke scale 2. Due to his prior strokes and stroke risk factors he was admitted for further work-up.  Date last known well: Date: 09/04/2018 Time last known well: Unable to determine tPA Given: No: outside window period.  Past Medical History:  Diagnosis Date  . Arthritis    "all over" (02/10/2018)  . Balance problem    when first stands.  Since stroke.  . Constipation   . 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 Degenerative joint disease (DJD)  of hip   . Diabetic neuropathy (HCC)   . Diabetic neuropathy (HCC)    fingers  . 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 Glaucoma, both eyes   . High cholesterol   . History of gout   . 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 Macular degeneration, left eye   . Sinus headache    resolved  . Type II diabetes mellitus (HCC)     Past Surgical History:  Procedure Laterality Date  . APPENDECTOMY    . BACK SURGERY    . CATARACT EXTRACTION, BILATERAL    . COLONOSCOPY WITH PROPOFOL N/A 12/29/2016   Procedure: COLONOSCOPY WITH PROPOFOL;  Surgeon: Midge Minium, MD;  Location: [facility];  Service: Endoscopy;  Laterality: N/A;  Diabetic - oral meds  . JOINT REPLACEMENT    . LAPAROSCOPIC CHOLECYSTECTOMY    . LOOP RECORDER INSERTION N/A 02/24/2017   Procedure: Loop Recorder Insertion;  Surgeon: Marcina Millard, MD;  Location: [facility];  Service: Cardiovascular;  Laterality: N/A;  . LUMBAR DISC SURGERY    . TOTAL HIP ARTHROPLASTY Left 02/10/2018   Procedure: TOTAL HIP ARTHROPLASTY ANTERIOR APPROACH;  Surgeon: Gean Birchwood, MD;  Location: [facility];  Service: Orthopedics;  Laterality: Left;  . TOTAL KNEE ARTHROPLASTY Left 08/11/2018   Procedure: LEFT TOTAL KNEE ARTHROPLASTY;  Surgeon: Gean Birchwood, MD;  Location: [facility];  Service: Orthopedics;  Laterality: Left;    Family History  Problem Relation Age of Onset  . Cancer Mother   .  Cancer Father    Social History:  reports that he has never smoked. He has never used smokeless tobacco. He reports that he does not drink alcohol or use drugs.  Allergies: No Known Allergies  Medications:  I have reviewed the patient's current medications. Prior to Admission:  Medications Prior to Admission  Medication Sig Dispense Refill Last Dose  . aspirin EC 81 MG tablet Take 1 tablet (81 mg total) by mouth 2 (two) times daily. 60 tablet 0 09/05/2018 at 1930  . atorvastatin  (LIPITOR) 80 MG tablet Take 80 mg by mouth every evening.    09/05/2018 at 0830  . brimonidine (ALPHAGAN) 0.2 % ophthalmic solution Place 1 drop into both eyes 2 (two) times daily.   0 09/05/2018 at 1930  . brinzolamide (AZOPT) 1 % ophthalmic suspension Place 1 drop into both eyes 2 (two) times daily.   09/05/2018 at 1930  . Cholecalciferol (VITAMIN D3) 2000 units capsule Take 2,000 Units by mouth daily.   09/05/2018 at 0800  . desonide (DESOWEN) 0.05 % lotion Apply 1 application topically 2 (two) times daily.   09/05/2018 at 1930  . fluticasone (FLONASE) 50 MCG/ACT nasal spray Place 2 sprays into both nostrils daily as needed for rhinitis.   PRN at PRN  . indomethacin (INDOCIN SR) 75 MG CR capsule Take 75 mg by mouth 2 (two) times daily.   1 09/05/2018 at 1930  . latanoprost (XALATAN) 0.005 % ophthalmic solution Place 1 drop into both eyes at bedtime.   0 09/05/2018 at 1930  . metFORMIN (GLUCOPHAGE) 500 MG tablet Take 500 mg by mouth 2 (two) times daily with a meal.  11 09/06/2018 at 0830  . oxyCODONE-acetaminophen (PERCOCET/ROXICET) 5-325 MG tablet Take 1 tablet by mouth every 4 (four) hours as needed for severe pain. 30 tablet 0 PRN at PRN  . senna (SENOKOT) 8.6 MG tablet Take 2 tablets by mouth daily as needed for constipation.   PRN at PRN  . tiZANidine (ZANAFLEX) 2 MG tablet Take 1 tablet (2 mg total) by mouth every 6 (six) hours as needed. (Patient taking differently: Take 2 mg by mouth 2 (two) times daily. ) 60 tablet 0 09/05/2018 at 1930   Scheduled: .  stroke: mapping our early stages of recovery book   Does not apply Once  . aspirin EC  81 mg Oral BID  . atorvastatin  80 mg Oral QPM  . brimonidine  1 drop Both Eyes BID  . brinzolamide  1 drop Both Eyes BID  . cholecalciferol  2,000 Units Oral Daily  . enoxaparin (LOVENOX) injection  40 mg Subcutaneous Q24H  . Influenza vac split quadrivalent PF  0.5 mL Intramuscular Tomorrow-1000  . insulin aspart  0-9 Units Subcutaneous TID WC  .  latanoprost  1 drop Both Eyes QHS  . metFORMIN  500 mg Oral BID WC  . pneumococcal 23 valent vaccine  0.5 mL Intramuscular Tomorrow-1000  . tiZANidine  2 mg Oral BID    ROS: History obtained from the patient   General ROS: negative for - chills, fatigue, fever, night sweats, weight gain or weight loss Psychological ROS: negative for - behavioral disorder, hallucinations, memory difficulties, mood swings or suicidal ideation Ophthalmic ROS: negative for - double vision, eye pain or loss of vision. Positive for blurry vision ENT ROS: negative for - epistaxis, nasal discharge, oral lesions, sore throat, tinnitus or vertigo Allergy and Immunology ROS: negative for - hives or itchy/watery eyes Hematological and Lymphatic ROS: negative for - bleeding  problems, bruising or swollen lymph nodes Endocrine ROS: negative for - galactorrhea, hair pattern changes, polydipsia/polyuria or temperature intolerance Respiratory ROS: negative for - cough, hemoptysis, shortness of breath or wheezing Cardiovascular ROS: negative for - chest pain, dyspnea on exertion, edema or irregular heartbeat Gastrointestinal ROS: negative for - abdominal pain, diarrhea, hematemesis, nausea/vomiting or stool incontinence Genito-Urinary ROS: negative for - dysuria, hematuria, incontinence or urinary frequency/urgency Musculoskeletal ROS: negative for - joint swelling or muscular weakness Neurological ROS: as noted in HPI Dermatological ROS: negative for rash and skin lesion changes  Physical Exam   Vitals Blood pressure 129/74, pulse 82, temperature 98.1 F (36.7 C), temperature source Oral, resp. rate 18, height 6\' 4"  (1.93 m), weight 88.5 kg, SpO2 98 %.   HEENT-  Normocephalic, no lesions, without obvious abnormality.  Normal external eye and conjunctiva.  Normal TM's bilaterally.  Normal auditory canals and external ears. Normal external nose, mucus membranes and septum.  Normal pharynx. Cardiovascular- S1, S2 normal,  pulses palpable throughout   Lungs- chest clear, no wheezing, rales, normal symmetric air entry Abdomen- soft, non-tender; bowel sounds normal; no masses,  no organomegaly Extremities- no edema Lymph-no adenopathy palpable Musculoskeletal-no joint tenderness, deformity or swelling Skin-warm and dry, no hyperpigmentation, vitiligo, or suspicious lesions  Neurological Exam   Mental Status: Alert, oriented, thought content appropriate.  Speech fluent without evidence of aphasia.  Able to follow 3 step commands without difficulty. Attention span and concentration seemed appropriate  Cranial Nerves: II: Discs flat bilaterally; Visual fields grossly normal, pupils equal, round, reactive to light and accommodation III,IV, VI: ptosis not present, extra-ocular motions intact bilaterally V,VII: smile symmetric, facial light touch sensation intact VIII: hard of hearing bilaterally IX,X: gag reflex present XI: bilateral shoulder shrug XII: midline tongue extension Motor: Right :  Upper extremity   5/5 without pronator drift      Left: Upper extremity   5/5 without pronator drift Right:   Lower extremity   5/5                                          Left: Lower extremity   4/5, due to recent hip and knee surgery Tone and bulk:normal tone throughout; no atrophy noted Sensory: Pinprick and light touch  intact bilateral Deep Tendon Reflexes: 2+ in the upper extremities, 1+ at the right knee, left knee deferred due to recent surgery, absent AJ's bilaterally Plantars: Right: equivocal                             Left: downgoing  Cerebellar: Finger-to-nose testing intact bilaterally. Heel to shin testing not tested on the left due to recent surgery Gait: not tested due to safety concerns  Data Reviewed  Laboratory Studies:  Basic Metabolic Panel: Recent Labs  Lab 09/06/18 1201  NA 138  K 4.1  CL 103  CO2 27  GLUCOSE 172*  BUN 11  CREATININE 0.78  CALCIUM 10.1    Liver Function  Tests: Recent Labs  Lab 09/06/18 1201  AST 17  ALT 16  ALKPHOS 135*  BILITOT 1.6*  PROT 6.9  ALBUMIN 4.6   No results for input(s): LIPASE, AMYLASE in the last 168 hours. No results for input(s): AMMONIA in the last 168 hours.  CBC: Recent Labs  Lab 09/06/18 1234  WBC 5.7  HGB 14.7  HCT 43.3  MCV 88.8  PLT 286    Cardiac Enzymes: Recent Labs  Lab 09/06/18 1201  TROPONINI <0.03    BNP: Invalid input(s): POCBNP  CBG: No results for input(s): GLUCAP in the last 168 hours.  Microbiology: Results for orders placed or performed during the hospital encounter of 08/04/18  Surgical pcr screen     Status: None   Collection Time: 08/04/18 11:41 AM  Result Value Ref Range Status   MRSA, PCR NEGATIVE NEGATIVE Final   Staphylococcus aureus NEGATIVE NEGATIVE Final    Comment: (NOTE) The Xpert SA Assay (FDA approved for NASAL specimens in patients 65 years of age and older), is one component of a comprehensive surveillance program. It is not intended to diagnose infection nor to guide or monitor treatment. Performed at [facility], [address] [place], [place] [address]     Coagulation Studies: No results for input(s): LABPROT, INR in the last 72 hours.  Urinalysis: No results for input(s): COLORURINE, LABSPEC, PHURINE, GLUCOSEU, HGBUR, BILIRUBINUR, KETONESUR, PROTEINUR, UROBILINOGEN, NITRITE, LEUKOCYTESUR in the last 168 hours.  Invalid input(s): APPERANCEUR  Lipid Panel: No results found for: CHOL, TRIG, HDL, CHOLHDL, VLDL, LDLCALC  HgbA1C:  Lab Results  Component Value Date   HGBA1C 7.1 (H) 08/11/2018    Urine Drug Screen:  No results found for: LABOPIA, COCAINSCRNUR, LABBENZ, AMPHETMU, THCU, LABBARB  Alcohol Level:  Recent Labs  Lab 09/06/18 1234  ETH <10    Other results: EKG: sinus tachycardia, PAC's noted. Vent. rate 105 BPM PR interval 138 ms QRS duration 82 ms QT/QTc 332/438 ms P-R-T axes 76 12 63  Imaging: Ct  Head Wo Contrast  Result Date: 09/06/2018 CLINICAL DATA:  Dizziness intermittent since yesterday. Intermittent L fingers numbness x 2 weeks. Smile symmetrical, grips and leg strength equal. States had stroke 3 years ago but did not leave him with weakness. History of L knee surgery September 6th this year. EXAM: CT HEAD WITHOUT CONTRAST TECHNIQUE: Contiguous axial images were obtained from the base of the skull through the vertex without intravenous contrast. COMPARISON:  None. FINDINGS: Brain: No evidence of acute infarction, hemorrhage, hydrocephalus, extra-axial collection or mass lesion/mass effect. There is a small area of encephalomalacia involving the posterior left frontal lobe consistent with an old MCA distribution infarct. Patchy areas of periventricular white matter hypoattenuation are noted bilaterally consistent with mild chronic microvascular ischemic change. Small old lacunar infarct in the left caudate nucleus head. Vascular: No hyperdense vessel or unexpected calcification. Skull: Normal. Negative for fracture or focal lesion. Sinuses/Orbits: Visualized globes and orbits are unremarkable. Visualized sinuses and mastoid air cells are clear. Other: None. IMPRESSION: 1. No acute intracranial abnormalities. 2. Old left MCA distribution infarct involving the posterior left frontal lobe. Mild chronic microvascular ischemic change. Electronically Signed   By: Amie Portland M.D.   On: 09/06/2018 14:28    Patient seen and examined.  Clinical course and management discussed.  Necessary edits performed.  I agree with the above.  Assessment and plan of care developed and discussed below.     Assessment: 77 y.o. 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  MRI/MRA head personally reviewed and shows an acute small  right occipital infarct, old left MCA infarct and lacunar infarcts in the basal ganglia.  Etiology likely embolic.  MRA brain showed no large vessel occlusion or significant stenosis.  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  However this was recently discontinued on 04/2018 due to hip and knee surgery.  He was on aspirin 81 mg prior to this event.  He currently has a Paediatric nurse device for evaluation for evidence of atrial fibrillation, so far there is been no evidence of atrial fibrillation.  Hemoglobin A1c 6.4, LDL 58.  Carotid dopplers show no evidence of hemodynamically significant stenosis.  Echocardiogram shows no cardiac source of emboli with an EF of 60-65%.    Stroke Risk Factors - diabetes mellitus, family history, hyperlipidemia and hypertension  Plan: 1. Recommend resumption of Eliquis once resumed safe based on recent surgery.  With resumption of Eliquis would discontinue ASA.   2. Continue statin 3. PT consult, OT consult, Speech consult 4. . NPO until RN stroke swallow screen 5. Telemetry monitoring 6. Frequent neuro checks   This patient was staffed with Dr. Verlon Au, Thad Ranger who personally evaluated patient, reviewed documentation and agreed with assessment and plan of care as above.  Webb Silversmith, DNP, FNP-BC Board certified Nurse Practitioner Neurology Department  09/06/2018, 3:44 PM  Thana Farr, MD Neurology [phone]  09/07/2018  3:03 PM

## 2018-09-06 NOTE — ED Provider Notes (Signed)
[facility] Emergency Department Provider Note  ____________________________________________   I have reviewed the triage vital signs and the nursing notes. Where available I have reviewed prior notes and, if possible and indicated, outside hospital notes.    HISTORY  Chief Complaint Dizziness and Numbness    HPI Harold Sandoval is a 77 y.o. male  CVA in March 2019 status post TPA, denies residual at baseline , this is been having some tingling in his left hand off and on for some time.  He is very vague about how long.  Perhaps months.  Is been perhaps more noticeable over the last couple days.  Family are concerned because he forgot their phone number, that is atypical for him.  He has not had an issue like that before.  Normally calls them with no difficulty.  He had a recent knee replacement, and went home to his own house from his girlfriends on Saturday.  He try to call her last night and could not call her because he could not find the number.  At this moment he still does not know the phone number which is very concerning he does not know the date which is very concerning family states these are absolutely different from his baseline.  Patient does not have any focal numbness or weakness at this time he states sometimes there is a tingling that goes from one bigger to the other on the left hand.  This is not happening at this time however.  He does not believe that he is having word finding difficulty but he is having trouble telling what time it is.     Past Medical History:  Diagnosis Date  . Arthritis    "all over" (02/10/2018)  . Balance problem    when first stands.  Since stroke.  . Constipation   . 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 Degenerative joint disease (DJD) of hip   . Diabetic neuropathy (HCC)   . Diabetic neuropathy (HCC)    fingers  . Diverticulosis of rectosigmoid  06/26/2016   multiple rectosigmoid colon noted on CT ABD/PELVIS  . Glaucoma, both eyes   . High cholesterol   . History of gout   . 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 Macular degeneration, left eye   . Sinus headache    resolved  . Type II diabetes mellitus [facility])     Patient Active Problem List   Diagnosis Date Noted  . Primary osteoarthritis of left knee 08/11/2018  . Degenerative arthritis of left knee 08/10/2018  . 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 Osteoarthritis of left hip 02/09/2018  . Constipation   . Change in bowel habits   . Chest pain 05/12/2016  . Strain of rectus abdominis muscle 08/01/2015    Past Surgical History:  Procedure Laterality Date  . APPENDECTOMY    . BACK SURGERY    . CATARACT EXTRACTION, BILATERAL    . COLONOSCOPY WITH PROPOFOL N/A 12/29/2016   Procedure: COLONOSCOPY WITH PROPOFOL;  Surgeon: Midge Minium, MD;  Location: [facility];  Service: Endoscopy;  Laterality: N/A;  Diabetic - oral meds  . JOINT REPLACEMENT    . LAPAROSCOPIC CHOLECYSTECTOMY    . LOOP RECORDER INSERTION N/A 02/24/2017   Procedure: Loop Recorder Insertion;  Surgeon: Marcina Millard, MD;  Location: [facility] INVASIVE CV LAB;  Service: Cardiovascular;  Laterality: N/A;  . LUMBAR DISC SURGERY    .  TOTAL HIP ARTHROPLASTY Left 02/10/2018   Procedure: TOTAL HIP ARTHROPLASTY ANTERIOR APPROACH;  Surgeon: Gean Birchwood, MD;  Location: [facility] OR;  Service: Orthopedics;  Laterality: Left;  . TOTAL KNEE ARTHROPLASTY Left 08/11/2018   Procedure: LEFT TOTAL KNEE ARTHROPLASTY;  Surgeon: Gean Birchwood, MD;  Location: [facility] ORS;  Service: Orthopedics;  Laterality: Left;    Prior to Admission medications   Medication Sig Start Date End Date Taking? Authorizing Provider  aspirin EC 81 MG tablet Take 1 tablet (81 mg total) by mouth 2 (two) times daily. 08/11/18   Allena Katz, PA-C  atorvastatin (LIPITOR) 80 MG tablet Take 80 mg by mouth every evening.     [provider]   brimonidine (ALPHAGAN) 0.2 % ophthalmic solution Place 1 drop into both eyes 2 (two) times daily.     [provider]  brinzolamide (AZOPT) 1 % ophthalmic suspension Place 1 drop into both eyes 2 (two) times daily.    [provider]  Cholecalciferol (VITAMIN D3) 2000 units capsule Take 2,000 Units by mouth daily.    [provider]  desonide (DESOWEN) 0.05 % lotion Apply 1 application topically 2 (two) times daily.    [provider]  fluticasone (FLONASE) 50 MCG/ACT nasal spray Place 2 sprays into both nostrils daily as needed for rhinitis.    [provider]  indomethacin (INDOCIN SR) 75 MG CR capsule Take 75 mg by mouth 2 (two) times daily as needed for pain. 07/06/18   [provider]  latanoprost (XALATAN) 0.005 % ophthalmic solution Place 1 drop into both eyes at bedtime.     [provider]  metFORMIN (GLUCOPHAGE-XR) 500 MG 24 hr tablet Take 500 mg by mouth 2 (two) times daily.     [provider]  oxyCODONE-acetaminophen (PERCOCET/ROXICET) 5-325 MG tablet Take 1 tablet by mouth every 4 (four) hours as needed for severe pain. 08/11/18   Allena Katz, PA-C  senna (SENOKOT) 8.6 MG tablet Take 2 tablets by mouth daily as needed for constipation.    [provider]  tiZANidine (ZANAFLEX) 2 MG tablet Take 1 tablet (2 mg total) by mouth every 6 (six) hours as needed. 08/11/18   Allena Katz, PA-C    Allergies Patient has no known allergies.  Family History  Problem Relation Age of Onset  . Cancer Mother   . Cancer Father     Social History Social History   Tobacco Use  . Smoking status: Never Smoker  . Smokeless tobacco: Never Used  Substance Use Topics  . Alcohol use: No    Alcohol/week: 0.0 standard drinks  . Drug use: No    Review of Systems Constitutional: No fever/chills Eyes: No visual changes. ENT: No sore throat. No stiff neck no neck pain Cardiovascular: Denies chest  pain. Respiratory: Denies shortness of breath. Gastrointestinal:   no vomiting.  No diarrhea.  No constipation. Genitourinary: Negative for dysuria. Musculoskeletal: Negative lower extremity swelling Skin: Negative for rash. Neurological: see HPI.   ____________________________________________   PHYSICAL EXAM:  VITAL SIGNS: ED Triage Vitals [09/06/18 1158]  Enc Vitals Group     BP 103/80     Pulse Rate (!) 104     Resp 18     Temp 98 F (36.7 C)     Temp Source Oral     SpO2 100 %     Weight 195 lb (88.5 kg)     Height 6\' 4"  (1.93 m)     Head Circumference  Peak Flow      Pain Score 0     Pain Loc      Pain Edu?      Excl. in GC?     Constitutional: Alert and oriented to name and place unsure of the date unsure of who the president is. Well appearing and in no acute distress. Eyes: Conjunctivae are normal Head: Atraumatic HEENT: No congestion/rhinnorhea. Mucous membranes are moist.  Oropharynx non-erythematous Neck:   Nontender with no meningismus, no masses, no stridor Cardiovascular: Normal rate, regular rhythm. Grossly normal heart sounds.  Good peripheral circulation. Respiratory: Normal respiratory effort.  No retractions. Lungs CTAB. Abdominal: Soft and nontender. No distention. No guarding no rebound Back:  There is no focal tenderness or step off.  there is no midline tenderness there are no lesions noted. there is no CVA tenderness Musculoskeletal: No lower extremity tenderness, no upper extremity tenderness. No joint effusions, no DVT signs strong distal pulses no edema Neurologic: Does not have dysarthria ever he has some degree of  difficulty remembering phone numbers and words that he normally would know sometimes and he does not know the date which apparently is different from his baseline.  Family is very concerned that this is a complete change from what he was like as recently as Saturday.  Last known well was Saturday.  Cranial nerves are grossly  intact finger-to-nose is grossly intact.  Strength and sensation are intact.  Aside from acute issues with memory,, he has no other neurologic findings Skin:  Skin is warm, dry and intact. No rash noted. Psychiatric: Mood and affect are normal. Speech and behavior are normal.  ____________________________________________   LABS (all labs ordered are listed, but only abnormal results are displayed)  Labs Reviewed  COMPREHENSIVE METABOLIC PANEL - Abnormal; Notable for the following components:      Result Value   Glucose, Bld 172 (*)    Alkaline Phosphatase 135 (*)    Total Bilirubin 1.6 (*)    All other components within normal limits  TROPONIN I  CBC  URINALYSIS, COMPLETE (UACMP) WITH MICROSCOPIC    Pertinent labs  results that were available during my care of the patient were reviewed by me and considered in my medical decision making (see chart for details). ____________________________________________  EKG  I personally interpreted any EKGs ordered by me or triage Tachycardia rate 105, no acute ST elevation or depression nonspecific ST changes, normal axis. ____________________________________________  RADIOLOGY  Pertinent labs & imaging results that were available during my care of the patient were reviewed by me and considered in my medical decision making (see chart for details). If possible, patient and/or family made aware of any abnormal findings.  Ct Head Wo Contrast  Result Date: 09/06/2018 CLINICAL DATA:  Dizziness intermittent since yesterday. Intermittent L fingers numbness x 2 weeks. Smile symmetrical, grips and leg strength equal. States had stroke 3 years ago but did not leave him with weakness. History of L knee surgery September 6th this year. EXAM: CT HEAD WITHOUT CONTRAST TECHNIQUE: Contiguous axial images were obtained from the base of the skull through the vertex without intravenous contrast. COMPARISON:  None. FINDINGS: Brain: No evidence of acute  infarction, hemorrhage, hydrocephalus, extra-axial collection or mass lesion/mass effect. There is a small area of encephalomalacia involving the posterior left frontal lobe consistent with an old MCA distribution infarct. Patchy areas of periventricular white matter hypoattenuation are noted bilaterally consistent with mild chronic microvascular ischemic change. Small old lacunar infarct in the left caudate  nucleus head. Vascular: No hyperdense vessel or unexpected calcification. Skull: Normal. Negative for fracture or focal lesion. Sinuses/Orbits: Visualized globes and orbits are unremarkable. Visualized sinuses and mastoid air cells are clear. Other: None. IMPRESSION: 1. No acute intracranial abnormalities. 2. Old left MCA distribution infarct involving the posterior left frontal lobe. Mild chronic microvascular ischemic change. Electronically Signed   By: Amie Portland M.D.   On: 09/06/2018 14:28   ____________________________________________    PROCEDURES  Procedure(s) performed: None  Procedures  Critical Care performed: None  ____________________________________________   INITIAL IMPRESSION / ASSESSMENT AND PLAN / ED COURSE  Pertinent labs & imaging results that were available during my care of the patient were reviewed by me and considered in my medical decision making (see chart for details).  Patient neurologically intact but with a concerning confusion about his girlfriend's phone number, who the president is, and what the date is.  This is disorientation, is acute for him but at least new since Saturday.  He was up currently starting to have trouble with phone numbers yesterday.  He also complains of a chronic recurrent tingling in his hand which I cannot elicit which is not present at this moment.  Is not a candidate for TPA however, I am concerned that he may have had a recurrent CVA.  We will send an ammonia level, he does not drink alcohol we will add on an alcohol level, and I  will discuss with the hospitalist further evaluation    ____________________________________________   FINAL CLINICAL IMPRESSION(S) / ED DIAGNOSES  Final diagnoses:  None      This chart was dictated using voice recognition software.  Despite best efforts to proofread,  errors can occur which can change meaning.      Jeanmarie Plant, MD 09/06/18 1450

## 2018-09-07 ENCOUNTER — Inpatient Hospital Stay: Payer: Medicare HMO

## 2018-09-07 ENCOUNTER — Inpatient Hospital Stay
Admit: 2018-09-07 | Discharge: 2018-09-07 | Disposition: A | Discharge status: Home / Self Care | Payer: Medicare HMO | Attending: Internal Medicine | Admitting: Internal Medicine

## 2018-09-07 LAB — HEMOGLOBIN A1C
Hgb A1c MFr Bld: 6.4 % — ABNORMAL HIGH (ref 4.8–5.6)
Mean Plasma Glucose: 137 mg/dL

## 2018-09-07 LAB — GLUCOSE, CAPILLARY
Glucose-Capillary: 115 mg/dL — ABNORMAL HIGH (ref 70–99)
Glucose-Capillary: 118 mg/dL — ABNORMAL HIGH (ref 70–99)
Glucose-Capillary: 103 mg/dL — ABNORMAL HIGH (ref 70–99)

## 2018-09-07 MED ORDER — ASPIRIN EC 81 MG PO TBEC: 81.0000 mg | DELAYED_RELEASE_TABLET | Freq: Every day | ORAL | 0 refills | Status: DC

## 2018-09-07 MED ORDER — OXYCODONE-ACETAMINOPHEN 5-325 MG PO TABS: 1.0000 | ORAL_TABLET | ORAL | Status: DC | PRN

## 2018-09-07 MED ORDER — APIXABAN 5 MG PO TABS: 5.0000 mg | ORAL_TABLET | Freq: Two times a day (BID) | ORAL | 2 refills | Status: AC

## 2018-09-07 NOTE — Progress Notes (Signed)
SLP Cancellation Note  Patient Details Name: Harold Sandoval MRN: [id number] DOB: [birth year]   Cancelled treatment:       Reason Eval/Treat Not Completed: Patient at procedure or test/unavailable(chart reviewed; pt out of room for test). ST services will f/u as time permits today or in the morning.     Jerilynn Som, MS, CCC-SLP Shyniece Scripter 09/07/2018, 11:09 AM

## 2018-09-07 NOTE — Discharge Summary (Addendum)
[facility] [place] at [facility]   PATIENT NAME: Harold Sandoval    MR#:  [id number]  DATE OF BIRTH:  1941/07/11  DATE OF ADMISSION:  09/06/2018   ADMITTING PHYSICIAN: Enedina Finner, MD  DATE OF DISCHARGE:  09/07/18  PRIMARY CARE PHYSICIAN: Gracelyn Nurse, MD   ADMISSION DIAGNOSIS:   Confusion [R41.0]  DISCHARGE DIAGNOSIS:   Active Problems:   Acute CVA (cerebrovascular accident) [facility])   SECONDARY DIAGNOSIS:   Past Medical History:  Diagnosis Date  . Arthritis    "all over" (02/10/2018)  . Balance problem    when first stands.  Since stroke.  . Constipation   . 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 Degenerative joint disease (DJD) of hip   . Diabetic neuropathy (HCC)   . Diabetic neuropathy (HCC)    fingers  . 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 Glaucoma, both eyes   . High cholesterol   . History of gout   . 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 Macular degeneration, left eye   . Sinus headache    resolved  . Type II diabetes mellitus [facility])     HOSPITAL COURSE:   77 year old male with past medical history significant for prior CVA with left middle cerebral arteries stroke requiring TPA, history of Lambl excrescences on the aortic valve, diabetes, hypertension, loop recorder showing A. fib presents to hospital secondary to confusion and visual changes.  1.  Acute CVA-MRI brain confirming right occipital CVA and old infarcts noted in left middle cerebral artery distribution. -Patient was supposed to be on Eliquis, however stopped after his knee surgery according to his girlfriend. -Appreciate neurology consult and cardiology consult.  Looks like an embolic stroke.  They have recommended starting Eliquis back.  Echocardiogram did not show any cardiac source of emboli.  Carotid Dopplers with moderate less than 50%  stenosis bilaterally. -Physical therapy and occupational therapy recommended home health -Cardiology recommended full dose of Eliquis for his A. fib at discharge due to his Linq device showing several episodes of A. fib on interrogation this admission -Patient is already on statin  2.  Diabetes mellitus-on metformin  3.  Hypertension-blood pressure is well controlled.  Not in any medications  4.  Glaucoma-continue eyedrops  5.  Left knee surgery-follow-up with orthopedics as recommended.  Up and ambulatory.  Will be discharged home today   DISCHARGE CONDITIONS:   Guarded  CONSULTS OBTAINED:   Treatment Team:  Kym Groom, MD Thana Farr, MD Dalia Heading, MD  DRUG ALLERGIES:   No Known Allergies DISCHARGE MEDICATIONS:   Allergies as of 09/07/2018   No Known Allergies     Medication List    STOP taking these medications   indomethacin 75 MG CR capsule Commonly known as:  INDOCIN SR     TAKE these medications   apixaban 5 MG Tabs tablet Commonly known as:  ELIQUIS Take 1 tablet (5 mg total) by mouth 2 (two) times daily.   aspirin EC 81 MG tablet Take 1 tablet (81 mg total) by mouth daily. What changed:  when to take this   atorvastatin 80 MG tablet Commonly known as:  LIPITOR Take 80 mg by mouth every evening.   brimonidine 0.2 % ophthalmic solution Commonly known as:  ALPHAGAN Place 1 drop into both eyes 2 (two) times daily.   brinzolamide 1 %  ophthalmic suspension Commonly known as:  AZOPT Place 1 drop into both eyes 2 (two) times daily.   desonide 0.05 % lotion Commonly known as:  DESOWEN Apply 1 application topically 2 (two) times daily.   fluticasone 50 MCG/ACT nasal spray Commonly known as:  FLONASE Place 2 sprays into both nostrils daily as needed for rhinitis.   latanoprost 0.005 % ophthalmic solution Commonly known as:  XALATAN Place 1 drop into both eyes at bedtime.   metFORMIN 500 MG tablet Commonly known as:   GLUCOPHAGE Take 500 mg by mouth 2 (two) times daily with a meal.   oxyCODONE-acetaminophen 5-325 MG tablet Commonly known as:  PERCOCET/ROXICET Take 1 tablet by mouth every 4 (four) hours as needed for severe pain.   senna 8.6 MG tablet Commonly known as:  SENOKOT Take 2 tablets by mouth daily as needed for constipation.   tiZANidine 2 MG tablet Commonly known as:  ZANAFLEX Take 1 tablet (2 mg total) by mouth every 6 (six) hours as needed. What changed:  when to take this   Vitamin D3 2000 units capsule Take 2,000 Units by mouth daily.        DISCHARGE INSTRUCTIONS:   1.  PCP follow-up in 1 to 2 weeks 2.  Neurology follow-up in 2 weeks 3.  Cardiology follow-up in 3 weeks  DIET:   Cardiac diet  ACTIVITY:   Activity as tolerated  OXYGEN:   Home Oxygen: No.  Oxygen Delivery: room air  DISCHARGE LOCATION:   home   If you experience worsening of your admission symptoms, develop shortness of breath, life threatening emergency, suicidal or homicidal thoughts you must seek medical attention immediately by calling 911 or calling your MD immediately  if symptoms less severe.  You Must read complete instructions/literature along with all the possible adverse reactions/side effects for all the Medicines you take and that have been prescribed to you. Take any new Medicines after you have completely understood and accpet all the possible adverse reactions/side effects.   Please note  You were cared for by a hospitalist during your hospital stay. If you have any questions about your discharge medications or the care you received while you were in the hospital after you are discharged, you can call the unit and asked to speak with the hospitalist on call if the hospitalist that took care of you is not available. Once you are discharged, your primary care physician will handle any further medical issues. Please note that NO REFILLS for any discharge medications will be authorized  once you are discharged, as it is imperative that you return to your primary care physician (or establish a relationship with a primary care physician if you do not have one) for your aftercare needs so that they can reassess your need for medications and monitor your lab values.    On the day of Discharge:  VITAL SIGNS:   Blood pressure (!) 113/59, pulse 82, temperature 98.2 F (36.8 C), temperature source Oral, resp. rate 18, height 6\' 4"  (1.93 m), weight 88.5 kg, SpO2 98 %.  PHYSICAL EXAMINATION:    GENERAL:  77 y.o.-year-old patient lying in the bed with no acute distress.  EYES: Pupils equal, round, reactive to light and accommodation. No scleral icterus. Extraocular muscles intact.  HEENT: Head atraumatic, normocephalic. Oropharynx and nasopharynx clear.  NECK:  Supple, no jugular venous distention. No thyroid enlargement, no tenderness.  LUNGS: Normal breath sounds bilaterally, no wheezing, rales,rhonchi or crepitation. No use of accessory muscles of respiration.  CARDIOVASCULAR: S1, S2 normal. No  rubs, or gallops.  2/6 systolic murmur present. loop recorder in place on the chest. ABDOMEN: Soft, non-tender, non-distended. Bowel sounds present. No organomegaly or mass.  EXTREMITIES: No pedal edema, cyanosis, or clubbing.  NEUROLOGIC: Cranial nerves II through XII are intact.  Right-sided hemianopsia.  Muscle strength 5/5 in all extremities. Sensation intact. Gait not checked.  PSYCHIATRIC: The patient is alert and oriented x 3.  SKIN: No obvious rash, lesion, or ulcer.   DATA REVIEW:   CBC Recent Labs  Lab 09/06/18 1234  WBC 5.7  HGB 14.7  HCT 43.3  PLT 286    Chemistries  Recent Labs  Lab 09/06/18 1201  NA 138  K 4.1  CL 103  CO2 27  GLUCOSE 172*  BUN 11  CREATININE 0.78  CALCIUM 10.1  AST 17  ALT 16  ALKPHOS 135*  BILITOT 1.6*     Microbiology Results  Results for orders placed or performed during the hospital encounter of 08/04/18  Surgical pcr  screen     Status: None   Collection Time: 08/04/18 11:41 AM  Result Value Ref Range Status   MRSA, PCR NEGATIVE NEGATIVE Final   Staphylococcus aureus NEGATIVE NEGATIVE Final    Comment: (NOTE) The Xpert SA Assay (FDA approved for NASAL specimens in patients 63 years of age and older), is one component of a comprehensive surveillance program. It is not intended to diagnose infection nor to guide or monitor treatment. Performed at [facility], [address], [place], [place] [address]     RADIOLOGY:  Mr Brain Wo Contrast  Result Date: 09/07/2018 CLINICAL DATA:  Worsening memory loss and intermittent LEFT hand numbness. Blurry vision. History of stroke, diabetes. EXAM: MRI HEAD WITHOUT CONTRAST MRA HEAD WITHOUT CONTRAST TECHNIQUE: Multiplanar, multiecho pulse sequences of the brain and surrounding structures were obtained without intravenous contrast. Angiographic images of the head were obtained using MRA technique without contrast. COMPARISON:  CT HEAD FINDINGS: MRI HEAD FINDINGS INTRACRANIAL CONTENTS: 1 cm reduced diffusion RIGHT occipital cortex with low ADC values. LEFT basal ganglia chronic microhemorrhage. LEFT parietal lobe encephalomalacia. Old bilateral basal ganglia and LEFT thalamus lacunar infarcts. Patchy to confluent supratentorial white matter FLAIR T2 hyperintensities. No midline shift, mass effect or masses. Mild ex vacuo dilatation LEFT atrium, no global parenchymal brain volume loss for age. No hydrocephalus. No abnormal extra-axial fluid collections. VASCULAR: Normal major intracranial vascular flow voids present at skull base. SKULL AND UPPER CERVICAL SPINE: No abnormal sellar expansion. No suspicious calvarial bone marrow signal. Craniocervical junction maintained. SINUSES/ORBITS: LEFT ethmoid mucosal thickening. Trace RIGHT mastoid effusion. Included ocular globes and orbital contents are non-suspicious. Status post bilateral ocular lens implants. OTHER:  None. MRA HEAD FINDINGS ANTERIOR CIRCULATION: Normal flow related enhancement of the included cervical, petrous, cavernous and supraclinoid internal carotid arteries. Mildly ectatic LEFT cavernous ICA. Luminal irregularity of bilateral carotid siphon compatible with atherosclerosis. Patent anterior communicating artery. Patent anterior and middle cerebral arteries. No large vessel occlusion, flow limiting stenosis, aneurysm. POSTERIOR CIRCULATION: RIGHT vertebral artery is dominant. Vertebrobasilar arteries are patent, with normal flow related enhancement of the main branch vessels. Patent posterior cerebral arteries. Small RIGHT and possibly LEFT posterior communicating arteries present. No large vessel occlusion, flow limiting stenosis,  aneurysm. ANATOMIC VARIANTS: None. Source images and MIP images were reviewed. IMPRESSION: MRI HEAD: 1. Acute 1 cm RIGHT occipital/PCA territory infarct. 2. Old LEFT parietal/MCA territory infarct. 3. Old lacunar infarcts and moderate chronic small vessel ischemic changes. MRA  HEAD: 1. No emergent large vessel occlusion or flow-limiting stenosis. Electronically Signed   By: Awilda Metro M.D.   On: 09/07/2018 01:30   US Carotid Bilateral (at [facility] Only)  Result Date: 09/07/2018 CLINICAL DATA:  77 year old male with acute right PCA territory infarct. EXAM: BILATERAL CAROTID DUPLEX ULTRASOUND TECHNIQUE: Wallace Cullens scale imaging, color Doppler and duplex ultrasound were performed of bilateral carotid and vertebral arteries in the neck. COMPARISON:  Prior duplex carotid ultrasound 01/17/1998 FINDINGS: Criteria: Quantification of carotid stenosis is based on velocity parameters that correlate the residual internal carotid diameter with NASCET-based stenosis levels, using the diameter of the distal internal carotid lumen as the denominator for stenosis measurement. The following velocity measurements were obtained: RIGHT ICA: 91/34 cm/sec CCA: 118/22 cm/sec SYSTOLIC ICA/CCA  RATIO:  0.8 ECA:  105 cm/sec LEFT ICA: 94/36 cm/sec CCA: 125/24 cm/sec SYSTOLIC ICA/CCA RATIO:  0.8 ECA:  81 cm/sec RIGHT CAROTID ARTERY: Mild heterogeneous atherosclerotic plaque in the proximal internal carotid artery. The plaque is smooth. By peak systolic velocity criteria, the estimated stenosis remains less than 50%. RIGHT VERTEBRAL ARTERY:  Patent with normal antegrade flow. LEFT CAROTID ARTERY: Mild smooth heterogeneous atherosclerotic plaque in the proximal internal carotid artery. By peak systolic velocity criteria, the estimated stenosis remains less than 50%. LEFT VERTEBRAL ARTERY:  Patent with normal antegrade flow. IMPRESSION: 1. Mild (1-49%) stenosis proximal right internal carotid artery secondary to mild smooth heterogeneous atherosclerotic plaque. 2. Mild (1-49%) stenosis proximal left internal carotid artery secondary to mild smooth heterogeneous atherosclerotic plaque. 3. Vertebral arteries are patent with normal antegrade flow. Signed, Sterling Big, MD, RPVI [facility] Electronically Signed   By: Malachy Moan M.D.   On: 09/07/2018 11:42   Mr Maxine Glenn Head/brain ZO Cm  Result Date: 09/07/2018 CLINICAL DATA:  Worsening memory loss and intermittent LEFT hand numbness. Blurry vision. History of stroke, diabetes. EXAM: MRI HEAD WITHOUT CONTRAST MRA HEAD WITHOUT CONTRAST TECHNIQUE: Multiplanar, multiecho pulse sequences of the brain and surrounding structures were obtained without intravenous contrast. Angiographic images of the head were obtained using MRA technique without contrast. COMPARISON:  CT HEAD FINDINGS: MRI HEAD FINDINGS INTRACRANIAL CONTENTS: 1 cm reduced diffusion RIGHT occipital cortex with low ADC values. LEFT basal ganglia chronic microhemorrhage. LEFT parietal lobe encephalomalacia. Old bilateral basal ganglia and LEFT thalamus lacunar infarcts. Patchy to confluent supratentorial white matter FLAIR T2  hyperintensities. No midline shift, mass effect or masses. Mild ex vacuo dilatation LEFT atrium, no global parenchymal brain volume loss for age. No hydrocephalus. No abnormal extra-axial fluid collections. VASCULAR: Normal major intracranial vascular flow voids present at skull base. SKULL AND UPPER CERVICAL SPINE: No abnormal sellar expansion. No suspicious calvarial bone marrow signal. Craniocervical junction maintained. SINUSES/ORBITS: LEFT ethmoid mucosal thickening. Trace RIGHT mastoid effusion. Included ocular globes and orbital contents are non-suspicious. Status post bilateral ocular lens implants. OTHER: None. MRA HEAD FINDINGS ANTERIOR CIRCULATION: Normal flow related enhancement of the included cervical, petrous, cavernous and supraclinoid internal carotid arteries. Mildly ectatic LEFT cavernous ICA. Luminal irregularity of bilateral carotid siphon compatible with atherosclerosis. Patent anterior communicating artery. Patent anterior and middle cerebral arteries. No large vessel occlusion, flow limiting stenosis, aneurysm. POSTERIOR CIRCULATION: RIGHT vertebral artery is dominant. Vertebrobasilar arteries are patent, with normal flow related enhancement of the main branch vessels. Patent posterior cerebral arteries. Small RIGHT and possibly LEFT posterior communicating arteries present. No large vessel occlusion, flow limiting stenosis,  aneurysm. ANATOMIC VARIANTS: None. Source images and MIP images were reviewed.  IMPRESSION: MRI HEAD: 1. Acute 1 cm RIGHT occipital/PCA territory infarct. 2. Old LEFT parietal/MCA territory infarct. 3. Old lacunar infarcts and moderate chronic small vessel ischemic changes. MRA HEAD: 1. No emergent large vessel occlusion or flow-limiting stenosis. Electronically Signed   By: Awilda Metro M.D.   On: 09/07/2018 01:30     Management plans discussed with the patient, family and they are in agreement.  CODE STATUS:     Code Status Orders  (From admission,  onward)         Start     Ordered   09/06/18 1714  Full code  Continuous     09/06/18 1713        Code Status History    Date Active Date Inactive Code Status Order ID Comments User Context   08/11/2018 1804 08/13/2018 1720 Full Code [id number]  Janetta Hora Inpatient   02/10/2018 1912 02/12/2018 2237 Full Code [id number]  Janetta Hora Inpatient   05/12/2016 2033 05/13/2016 1704 Full Code [id number]  Shaune Pollack, MD Inpatient      TOTAL TIME TAKING CARE OF THIS PATIENT: 38 minutes.    Angeni Chaudhuri M.D on 09/07/2018 at 3:40 PM  Between 7am to 6pm - Pager - [phone]  After 6pm go to [email or web address] - Social research officer, government  [facility] [place] [facility]  Office  [phone]  CC: Primary care physician; Gracelyn Nurse, MD   Note: This dictation was prepared with Dragon dictation along with smaller phrase technology. Any transcriptional errors that result from this process are unintentional.

## 2018-09-07 NOTE — Care Management (Addendum)
Patient just discharged from [facility] home health 2 weeks ago per patient's wife.  I have notified [place] with [facility] that patient would like to use them again. Patient's friend Katha Hamming states that [facility] has taken Monia Pouch in the recent past and was in-network.  Update from [place] with [facility] at 1620: [facility] cannot take patient for home health at this time.  I am checking with [facility]- per Elnita Maxwell with [facility] they can accept patient for home health. I will update patient/friend. Address:[address] [place] [phone].

## 2018-09-07 NOTE — Evaluation (Signed)
Physical Therapy Evaluation Patient Details Name: Harold Sandoval MRN: [id number] DOB: [birth year] Today's Date: 09/07/2018   History of Present Illness  Patient is a 77 year old male admitted for stroke workup following c/o weakness of L fingers and confusion.  PMH includes macular degeneration, glaucoma, L TKA, cryptogenic stroke and balance problems following stroke,.  Clinical Impression  Patient is a 77 year old male who lives alone in a one story home.  He is independent with use of SPC at baseline and has been driving to OP PT following L TKA 08/13/2018.  [name] significant other stated that pt drove home on the "left side of the road" the day she called EMS and that pt also has macular degeneration and needs glasses.  He presented with some confusion during interview and required clarification of questions.  He demonstrated good overall strength of UE's and LE's with some pain in L knee.  RN in to administer pain medication for L knee during evaluation.  Pt was independent with bed mobility and demonstrated difficulty in sitting upright without use of UE's at EOB.  He stood from bedside with multiple attempts and use of SPC, appearing unsteady on feet.  He demonstrated ability to perform static standing functional activity with unilateral UE assist and appearing slightly unsteady on feet.  Pt able to ambulate 50 ft in room with [facility] with gait deviations indicative of fall risk.  Pt will continue to benefit from skilled PT with focus on balance and fall prevention, pain management of L knee and safe functional mobility.  Due to vision and confusion causing pt to be unsafe to drive, pt will benefit from PT in the home health setting following discharge.    Follow Up Recommendations Home health PT;Supervision for mobility/OOB    Equipment Recommendations  None recommended by PT    Recommendations for Other Services       Precautions / Restrictions Precautions Precautions: Fall;Knee Precaution  Comments: Recent TKA Restrictions Weight Bearing Restrictions: No      Mobility  Bed Mobility Overal bed mobility: Independent                Transfers Overall transfer level: Needs assistance Equipment used: Straight cane Transfers: Sit to/from Stand Sit to Stand: Supervision         General transfer comment: Difficulty in standing; able to stand from bedside with 1-2 attempts and bed elevated.  Pt unsteady on feet and slow to stand upright.  Ambulation/Gait Ambulation/Gait assistance: Min guard Gait Distance (Feet): 50 Feet Assistive device: Straight cane     Gait velocity interpretation: 1.31 - 2.62 ft/sec, indicative of limited community ambulator General Gait Details: Good foot clearanace, step length, use of SPC.  Pt slow to turn when walking but able to do so without PT assistance.  Stairs            Wheelchair Mobility    Modified Rankin (Stroke Patients Only)       Balance Overall balance assessment: Needs assistance Sitting-balance support: Bilateral upper extremity supported;Feet supported Sitting balance-Leahy Scale: Fair Sitting balance - Comments: Pt is able to sit upright without UE support but prefers to use UE for support due to fatigue. Postural control: Posterior lean Standing balance support: Single extremity supported Standing balance-Leahy Scale: Fair Standing balance comment: Able to stand and simulate opening cabinets doors bilaterally, bend to lift object with use of SPC for support.  No LOB's.  Would prefer that pt be supervised when performing standing functional tasks  at this time.                             Pertinent Vitals/Pain Pain Assessment: Faces Faces Pain Scale: Hurts a little bit Pain Location: L knee and HA Pain Intervention(s): Monitored during session;RN gave pain meds during session    Home Living Family/patient expects to be discharged to:: Private residence Living Arrangements:  Alone Available Help at Discharge: Family;Available PRN/intermittently Type of Home: House Home Access: Stairs to enter Entrance Stairs-Rails: None Entrance Stairs-Number of Steps: 1 Home Layout: One level Home Equipment: Walker - 2 wheels;Cane - single point      Prior Function Level of Independence: Independent with assistive device(s)         Comments: has weaned to [facility] recently following RW use s/p TKA.     Hand Dominance        Extremity/Trunk Assessment   Upper Extremity Assessment Upper Extremity Assessment: Overall WFL for tasks assessed(Still reports N/T in distal 1/2 of digits 2-5 of L hand.)    Lower Extremity Assessment Lower Extremity Assessment: Overall WFL for tasks assessed(Grossly 4+/5 bilaterally.  No sensation loss reported.)    Cervical / Trunk Assessment Cervical / Trunk Assessment: Normal  Communication   Communication: No difficulties  Cognition Arousal/Alertness: Awake/alert Behavior During Therapy: WFL for tasks assessed/performed Overall Cognitive Status: Impaired/Different from baseline Area of Impairment: Memory                     Memory: Decreased short-term memory         General Comments: [name] significant other stated that he has had difficulty remembering things like her phone number.  She stated that this has gotten better but that he is still showing mild deficits today.  Pt needed prompting to respond to questions intermittently.      General Comments      Exercises     Assessment/Plan    PT Assessment Patient needs continued PT services  PT Problem List Decreased balance;Impaired sensation;Decreased activity tolerance       PT Treatment Interventions DME instruction;Therapeutic activities;Gait training;Therapeutic exercise;Patient/family education;Stair training;Balance training;Functional mobility training;Neuromuscular re-education    PT Goals (Current goals can be found in the Care Plan section)  Acute  Rehab PT Goals Patient Stated Goal: To return to general daily activity with as much independence as possible. PT Goal Formulation: With patient Time For Goal Achievement: 09/21/18 Potential to Achieve Goals: Good    Frequency 7X/week   Barriers to discharge        Co-evaluation               AM-PAC PT "6 Clicks" Daily Activity  Outcome Measure Difficulty turning over in bed (including adjusting bedclothes, sheets and blankets)?: None Difficulty moving from lying on back to sitting on the side of the bed? : A Little Difficulty sitting down on and standing up from a chair with arms (e.g., wheelchair, bedside commode, etc,.)?: A Little Help needed moving to and from a bed to chair (including a wheelchair)?: A Little Help needed walking in hospital room?: A Little Help needed climbing 3-5 steps with a railing? : A Little 6 Click Score: 19    End of Session Equipment Utilized During Treatment: Gait belt Activity Tolerance: Patient tolerated treatment well Patient left: in bed;with bed alarm set;with call bell/phone within reach;with family/visitor present Nurse Communication: Mobility status PT Visit Diagnosis: Unsteadiness on feet (R26.81)  Time: 1610-9604 PT Time Calculation (min) (ACUTE ONLY): 21 min   Charges:   PT Evaluation $PT Eval Low Complexity: 1 Low          Glenetta Hew, PT, DPT   Glenetta Hew 09/07/2018, 1:47 PM

## 2018-09-07 NOTE — Progress Notes (Signed)
*  PRELIMINARY RESULTS* Echocardiogram 2D Echocardiogram has been performed.  Joanette Gula Neesa Knapik 09/07/2018, 10:19 AM

## 2018-09-07 NOTE — Progress Notes (Signed)
   09/07/18 0945  Clinical Encounter Type  Visited With Patient and family together  Visit Type Initial;Spiritual support  Referral From Nurse  Consult/Referral To Chaplain  Spiritual Encounters  Spiritual Needs Brochure;Other (Comment)   CH received an OR to educate Harold Sandoval on the AD process. When entering the room the patient was receiving a test from respitory care. The patient's wife was present and informed me that I would need to come back later. I asked if I could just leave the paper work with her, which she agreed. I will follow up if needed.

## 2018-09-07 NOTE — Consult Note (Signed)
Cardiology Consultation Note    Patient ID: Harold Sandoval, MRN: [id number], DOB/AGE: 06/08/1941 77 y.o. Admit date: 09/06/2018   Date of Consult: 09/07/2018 Primary Physician: Gracelyn Nurse, MD Primary Cardiologist: Dr. Lady Gary  Chief Complaint: cva Reason for Consultation: eval for possible afib Requesting MD: Fr. Nemiah Commander  HPI: Harold Sandoval is a 77 y.o. 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  Of note the patient had knee surgery on September 4 was discharged on September 6.  He was off of Eliquis from several days prior to that procedure until recently.  He presented to the emergency room where head CT showed no acute intracranial abnormalities other than an old left middle cerebral artery stroke involving the posterior left frontal lobe.  Electrocardiogram revealed no ischemia.  Normal sinus rhythm.  Pulse ox was 98%.  He has a Medtronic Linq device in place and initially it has shown no atrial fibrillation.  Echocardiogram showed normal LV function with no cardiac source of emboli.  Carotid Dopplers revealed 1 to 49% stenosis of the proximal right internal carotid artery and a 1 to 49% stenosis of the proximal left carotid artery.  MRA of the brain showed acute 1 cm right occipital/PCA territory infarct.  He appears to be neurologically intact with some improvement.  Interrogation of his Linq device revealed multiple episodes of nonsustained atrial fibrillation.  Past Medical History:  Diagnosis Date  . Arthritis    "all over" (02/10/2018)  . Balance problem    when first stands.  Since stroke.  . Constipation   . 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 Degenerative joint  disease (DJD) of hip   . Diabetic neuropathy (HCC)   . Diabetic neuropathy (HCC)    fingers  . 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 Glaucoma, both eyes   . High cholesterol   . History of gout   . 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 Macular degeneration, left eye   . Sinus headache    resolved  . Type II diabetes mellitus (HCC)       Surgical History:  Past Surgical History:  Procedure Laterality Date  . APPENDECTOMY    . BACK SURGERY    . CATARACT EXTRACTION, BILATERAL    . COLONOSCOPY WITH PROPOFOL N/A 12/29/2016   Procedure: COLONOSCOPY WITH PROPOFOL;  Surgeon: Midge Minium, MD;  Location: [facility];  Service: Endoscopy;  Laterality: N/A;  Diabetic - oral meds  . JOINT REPLACEMENT    . LAPAROSCOPIC CHOLECYSTECTOMY    . LOOP RECORDER INSERTION N/A 02/24/2017   Procedure: Loop Recorder Insertion;  Surgeon: Marcina Millard, MD;  Location: [facility];  Service: Cardiovascular;  Laterality: N/A;  . LUMBAR DISC SURGERY    . TOTAL HIP ARTHROPLASTY Left 02/10/2018   Procedure: TOTAL HIP ARTHROPLASTY ANTERIOR APPROACH;  Surgeon: Gean Birchwood, MD;  Location: [facility] OR;  Service: Orthopedics;  Laterality: Left;  . TOTAL KNEE ARTHROPLASTY Left 08/11/2018   Procedure: LEFT TOTAL KNEE ARTHROPLASTY;  Surgeon: Gean Birchwood, MD;  Location: [facility];  Service: Orthopedics;  Laterality: Left;     Home Meds: Prior to Admission medications   Medication Sig  Start Date End Date Taking? Authorizing Provider  aspirin EC 81 MG tablet Take 1 tablet (81 mg total) by mouth 2 (two) times daily. 08/11/18  Yes Dannielle Burn K, PA-C  atorvastatin (LIPITOR) 80 MG tablet Take 80 mg by mouth every evening.    Yes [provider]  brimonidine (ALPHAGAN) 0.2 % ophthalmic solution Place 1 drop into both eyes 2 (two) times daily.    Yes [provider]  brinzolamide (AZOPT) 1 % ophthalmic suspension Place 1 drop into  both eyes 2 (two) times daily.   Yes [provider]  Cholecalciferol (VITAMIN D3) 2000 units capsule Take 2,000 Units by mouth daily.   Yes [provider]  desonide (DESOWEN) 0.05 % lotion Apply 1 application topically 2 (two) times daily.   Yes [provider]  fluticasone (FLONASE) 50 MCG/ACT nasal spray Place 2 sprays into both nostrils daily as needed for rhinitis.   Yes [provider]  indomethacin (INDOCIN SR) 75 MG CR capsule Take 75 mg by mouth 2 (two) times daily.  07/06/18  Yes [provider]  latanoprost (XALATAN) 0.005 % ophthalmic solution Place 1 drop into both eyes at bedtime.    Yes [provider]  metFORMIN (GLUCOPHAGE) 500 MG tablet Take 500 mg by mouth 2 (two) times daily with a meal. 08/03/18  Yes [provider]  oxyCODONE-acetaminophen (PERCOCET/ROXICET) 5-325 MG tablet Take 1 tablet by mouth every 4 (four) hours as needed for severe pain. 08/11/18  Yes Allena Katz, PA-C  senna (SENOKOT) 8.6 MG tablet Take 2 tablets by mouth daily as needed for constipation.   Yes [provider]  tiZANidine (ZANAFLEX) 2 MG tablet Take 1 tablet (2 mg total) by mouth every 6 (six) hours as needed. Patient taking differently: Take 2 mg by mouth 2 (two) times daily.  08/11/18  Yes Allena Katz, PA-C  apixaban (ELIQUIS) 5 MG TABS tablet Take 1 tablet (5 mg total) by mouth 2 (two) times daily. 09/07/18   Enid Baas, MD    Inpatient Medications:  .  stroke: mapping our early stages of recovery book   Does not apply Once  . aspirin EC  81 mg Oral BID  . atorvastatin  80 mg Oral QPM  . brimonidine  1 drop Both Eyes BID  . brinzolamide  1 drop Both Eyes BID  . cholecalciferol  2,000 Units Oral Daily  . enoxaparin (LOVENOX) injection  40 mg Subcutaneous Q24H  . Influenza vac split quadrivalent PF  0.5 mL Intramuscular Tomorrow-1000  . insulin aspart  0-9 Units Subcutaneous TID WC  . latanoprost  1 drop Both Eyes  QHS  . metFORMIN  500 mg Oral BID WC  . pneumococcal 23 valent vaccine  0.5 mL Intramuscular Tomorrow-1000  . tiZANidine  2 mg Oral BID   . sodium chloride      Allergies: No Known Allergies  Social History   Socioeconomic History  . Marital status: Widowed    Spouse name: Not on file  . Number of children: Not on file  . Years of education: Not on file  . Highest education level: Not on file  Occupational History  . Not on file  Social Needs  . Financial resource strain: Not on file  . Food insecurity:    Worry: Not on file    Inability: Not on file  . Transportation needs:    Medical: Not on file    Non-medical: Not on file  Tobacco Use  .  Smoking status: Never Smoker  . Smokeless tobacco: Never Used  Substance and Sexual Activity  . Alcohol use: No    Alcohol/week: 0.0 standard drinks  . Drug use: No  . Sexual activity: Yes    Partners: Female  Lifestyle  . Physical activity:    Days per week: Not on file    Minutes per session: Not on file  . Stress: Not on file  Relationships  . Social connections:    Talks on phone: Not on file    Gets together: Not on file    Attends religious service: Not on file    Active member of club or organization: Not on file    Attends meetings of clubs or organizations: Not on file    Relationship status: Not on file  . Intimate partner violence:    Fear of current or ex partner: Not on file    Emotionally abused: Not on file    Physically abused: Not on file    Forced sexual activity: Not on file  Other Topics Concern  . Not on file  Social History Narrative  . Not on file     Family History  Problem Relation Age of Onset  . Cancer Mother   . Cancer Father      Review of Systems: A 12-system review of systems was performed and is negative except as noted in the HPI.  Labs: Recent Labs    09/06/18 1201  TROPONINI <0.03   Lab Results  Component Value Date   WBC 5.7 09/06/2018   HGB 14.7 09/06/2018   HCT  43.3 09/06/2018   MCV 88.8 09/06/2018   PLT 286 09/06/2018    Recent Labs  Lab 09/06/18 1201  NA 138  K 4.1  CL 103  CO2 27  BUN 11  CREATININE 0.78  CALCIUM 10.1  PROT 6.9  BILITOT 1.6*  ALKPHOS 135*  ALT 16  AST 17  GLUCOSE 172*   Lab Results  Component Value Date   CHOL 99 09/06/2018   HDL 31 (L) 09/06/2018   LDLCALC 58 09/06/2018   TRIG 52 09/06/2018   No results found for: DDIMER  Radiology/Studies:  Ct Head Wo Sandoval  Result Date: 09/06/2018 CLINICAL DATA:  Dizziness intermittent since yesterday. Intermittent L fingers numbness x 2 weeks. Smile symmetrical, grips and leg strength equal. States had stroke 3 years ago but did not leave him with weakness. History of L knee surgery September 6th this year. EXAM: CT HEAD WITHOUT Sandoval TECHNIQUE: Contiguous axial images were obtained from the base of the skull through the vertex without intravenous Sandoval. COMPARISON:  None. FINDINGS: Brain: No evidence of acute infarction, hemorrhage, hydrocephalus, extra-axial collection or mass lesion/mass effect. There is a small area of encephalomalacia involving the posterior left frontal lobe consistent with an old MCA distribution infarct. Patchy areas of periventricular white matter hypoattenuation are noted bilaterally consistent with mild chronic microvascular ischemic change. Small old lacunar infarct in the left caudate nucleus head. Vascular: No hyperdense vessel or unexpected calcification. Skull: Normal. Negative for fracture or focal lesion. Sinuses/Orbits: Visualized globes and orbits are unremarkable. Visualized sinuses and mastoid air cells are clear. Other: None. IMPRESSION: 1. No acute intracranial abnormalities. 2. Old left MCA distribution infarct involving the posterior left frontal lobe. Mild chronic microvascular ischemic change. Electronically Signed   By: Amie Portland M.D.   On: 09/06/2018 14:28   Harold Sandoval  Result Date: 09/07/2018 CLINICAL DATA:   Worsening memory loss and  intermittent LEFT hand numbness. Blurry vision. History of stroke, diabetes. EXAM: MRI HEAD WITHOUT Sandoval MRA HEAD WITHOUT Sandoval TECHNIQUE: Multiplanar, multiecho pulse sequences of the brain and surrounding structures were obtained without intravenous Sandoval. Angiographic images of the head were obtained using MRA technique without Sandoval. COMPARISON:  CT HEAD FINDINGS: MRI HEAD FINDINGS INTRACRANIAL CONTENTS: 1 cm reduced diffusion RIGHT occipital cortex with low ADC values. LEFT basal ganglia chronic microhemorrhage. LEFT parietal lobe encephalomalacia. Old bilateral basal ganglia and LEFT thalamus lacunar infarcts. Patchy to confluent supratentorial white matter FLAIR T2 hyperintensities. No midline shift, mass effect or masses. Mild ex vacuo dilatation LEFT atrium, no global parenchymal brain volume loss for age. No hydrocephalus. No abnormal extra-axial fluid collections. VASCULAR: Normal major intracranial vascular flow voids present at skull base. SKULL AND UPPER CERVICAL SPINE: No abnormal sellar expansion. No suspicious calvarial bone marrow signal. Craniocervical junction maintained. SINUSES/ORBITS: LEFT ethmoid mucosal thickening. Trace RIGHT mastoid effusion. Included ocular globes and orbital contents are non-suspicious. Status post bilateral ocular lens implants. OTHER: None. MRA HEAD FINDINGS ANTERIOR CIRCULATION: Normal flow related enhancement of the included cervical, petrous, cavernous and supraclinoid internal carotid arteries. Mildly ectatic LEFT cavernous ICA. Luminal irregularity of bilateral carotid siphon compatible with atherosclerosis. Patent anterior communicating artery. Patent anterior and middle cerebral arteries. No large vessel occlusion, flow limiting stenosis, aneurysm. POSTERIOR CIRCULATION: RIGHT vertebral artery is dominant. Vertebrobasilar arteries are patent, with normal flow related enhancement of the main branch vessels. Patent posterior  cerebral arteries. Small RIGHT and possibly LEFT posterior communicating arteries present. No large vessel occlusion, flow limiting stenosis,  aneurysm. ANATOMIC VARIANTS: None. Source images and MIP images were reviewed. IMPRESSION: MRI HEAD: 1. Acute 1 cm RIGHT occipital/PCA territory infarct. 2. Old LEFT parietal/MCA territory infarct. 3. Old lacunar infarcts and moderate chronic small vessel ischemic changes. MRA HEAD: 1. No emergent large vessel occlusion or flow-limiting stenosis. Electronically Signed   By: Awilda Metro M.D.   On: 09/07/2018 01:30   US Carotid Bilateral (at [facility] And [facility] Only)  Result Date: 09/07/2018 CLINICAL DATA:  77 year old male with acute right PCA territory infarct. EXAM: BILATERAL CAROTID DUPLEX ULTRASOUND TECHNIQUE: Wallace Cullens scale imaging, color Doppler and duplex ultrasound were performed of bilateral carotid and vertebral arteries in the neck. COMPARISON:  Prior duplex carotid ultrasound 01/17/1998 FINDINGS: Criteria: Quantification of carotid stenosis is based on velocity parameters that correlate the residual internal carotid diameter with NASCET-based stenosis levels, using the diameter of the distal internal carotid lumen as the denominator for stenosis measurement. The following velocity measurements were obtained: RIGHT ICA: 91/34 cm/sec CCA: 118/22 cm/sec SYSTOLIC ICA/CCA RATIO:  0.8 ECA:  105 cm/sec LEFT ICA: 94/36 cm/sec CCA: 125/24 cm/sec SYSTOLIC ICA/CCA RATIO:  0.8 ECA:  81 cm/sec RIGHT CAROTID ARTERY: Mild heterogeneous atherosclerotic plaque in the proximal internal carotid artery. The plaque is smooth. By peak systolic velocity criteria, the estimated stenosis remains less than 50%. RIGHT VERTEBRAL ARTERY:  Patent with normal antegrade flow. LEFT CAROTID ARTERY: Mild smooth heterogeneous atherosclerotic plaque in the proximal internal carotid artery. By peak systolic velocity criteria, the estimated stenosis remains less than 50%. LEFT VERTEBRAL ARTERY:  Patent  with normal antegrade flow. IMPRESSION: 1. Mild (1-49%) stenosis proximal right internal carotid artery secondary to mild smooth heterogeneous atherosclerotic plaque. 2. Mild (1-49%) stenosis proximal left internal carotid artery secondary to mild smooth heterogeneous atherosclerotic plaque. 3. Vertebral arteries are patent with normal antegrade flow. Signed, Sterling Big, MD, RPVI [facility] Radiology Electronically Signed  By: Malachy Moan M.D.   On: 09/07/2018 11:42   Harold Sandoval Head/brain ZO Cm  Result Date: 09/07/2018 CLINICAL DATA:  Worsening memory loss and intermittent LEFT hand numbness. Blurry vision. History of stroke, diabetes. EXAM: MRI HEAD WITHOUT Sandoval MRA HEAD WITHOUT Sandoval TECHNIQUE: Multiplanar, multiecho pulse sequences of the brain and surrounding structures were obtained without intravenous Sandoval. Angiographic images of the head were obtained using MRA technique without Sandoval. COMPARISON:  CT HEAD FINDINGS: MRI HEAD FINDINGS INTRACRANIAL CONTENTS: 1 cm reduced diffusion RIGHT occipital cortex with low ADC values. LEFT basal ganglia chronic microhemorrhage. LEFT parietal lobe encephalomalacia. Old bilateral basal ganglia and LEFT thalamus lacunar infarcts. Patchy to confluent supratentorial white matter FLAIR T2 hyperintensities. No midline shift, mass effect or masses. Mild ex vacuo dilatation LEFT atrium, no global parenchymal brain volume loss for age. No hydrocephalus. No abnormal extra-axial fluid collections. VASCULAR: Normal major intracranial vascular flow voids present at skull base. SKULL AND UPPER CERVICAL SPINE: No abnormal sellar expansion. No suspicious calvarial bone marrow signal. Craniocervical junction maintained. SINUSES/ORBITS: LEFT ethmoid mucosal thickening. Trace RIGHT mastoid effusion. Included ocular globes and orbital contents are non-suspicious. Status post bilateral ocular lens implants. OTHER:  None. MRA HEAD FINDINGS ANTERIOR CIRCULATION: Normal flow related enhancement of the included cervical, petrous, cavernous and supraclinoid internal carotid arteries. Mildly ectatic LEFT cavernous ICA. Luminal irregularity of bilateral carotid siphon compatible with atherosclerosis. Patent anterior communicating artery. Patent anterior and middle cerebral arteries. No large vessel occlusion, flow limiting stenosis, aneurysm. POSTERIOR CIRCULATION: RIGHT vertebral artery is dominant. Vertebrobasilar arteries are patent, with normal flow related enhancement of the main branch vessels. Patent posterior cerebral arteries. Small RIGHT and possibly LEFT posterior communicating arteries present. No large vessel occlusion, flow limiting stenosis,  aneurysm. ANATOMIC VARIANTS: None. Source images and MIP images were reviewed. IMPRESSION: MRI HEAD: 1. Acute 1 cm RIGHT occipital/PCA territory infarct. 2. Old LEFT parietal/MCA territory infarct. 3. Old lacunar infarcts and moderate chronic small vessel ischemic changes. MRA HEAD: 1. No emergent large vessel occlusion or flow-limiting stenosis. Electronically Signed   By: Awilda Metro M.D.   On: 09/07/2018 01:30    Wt Readings from Last 3 Encounters:  09/06/18 88.5 kg  08/11/18 89 kg  08/09/18 89 kg    EKG: Normal sinus rhythm with no ischemia  Physical Exam:  Blood pressure (!) 113/59, pulse 82, temperature 98.2 F (36.8 C), temperature source Oral, resp. rate 18, height 6\' 4"  (1.93 m), weight 88.5 kg, SpO2 98 %. Body mass index is 23.74 kg/m. General: Well developed, well nourished, in no acute distress. Head: Normocephalic, atraumatic, sclera non-icteric, no xanthomas, nares are without discharge.  Neck: Negative for carotid bruits. JVD not elevated. Lungs: Clear bilaterally to auscultation without wheezes, rales, or rhonchi. Breathing is unlabored. Heart: RRR with S1 S2. No murmurs, rubs, or gallops appreciated. Abdomen: Soft, non-tender,  non-distended with normoactive bowel sounds. No hepatomegaly. No rebound/guarding. No obvious abdominal masses. Msk:  Strength and tone appear normal for age. Extremities: No clubbing or cyanosis. No edema.  Distal pedal pulses are 2+ and equal bilaterally.      Assessment and Plan  Patient with history of CVAs in the past who had been on Eliquis however this was discontinued for knee surgery and has been off of it for several days to week.  Interrogation of his Medtronic Linq after admission for new neurologic symptoms revealed episodic episodes of atrial fibrillation.  These are nonsustained.  Patient's chads score is 6.  Etiology of his  CVA is likely due to the atrial fibrillation.  Carotid Dopplers were unremarkable.  Would proceed with resuming Eliquis at 5 mg twice daily if acceptable from neurology.  We will follow-up as an outpatient.  Signed, Dalia Heading MD 09/07/2018, 3:18 PM Pager: [phone]

## 2018-09-07 NOTE — Evaluation (Signed)
Occupational Therapy Evaluation Patient Details Name: Harold Sandoval MRN: [id number] DOB: 10-Oct-1941 Today's Date: 09/07/2018    History of Present Illness Patient is a 77 year old male admitted for stroke workup following c/o weakness of L fingers and confusion.  PMH includes macular degeneration, glaucoma, L TKA, cryptogenic stroke and balance problems following stroke,. He had a L knee replacement on 08-11-18.   Clinical Impression   Pt is 77 year old male who presented with new onset of weakness and numbness in L fingers with confusion.  His girlfriend who is the the room with him stated that he was driving on the wrong side of the road on Sat and did not "feel right" when driving on Sun.  He has hx of macular degeneration and glaucoma with treatment.  He did not have any abnormal findings with the Amsler Grid as a screening for vision deficits due to macular degeneration and had normal peripheral vision and tracking with all visual fields.  He had difficulty with upward gaze but when asked to look up instead to the ceiiling, then he had normal upward gaze. Rec pt not drive while this sporadic change in vision occurs since he is not safe and to follow up with primary care doctor before resuming driving. He has good strength in BUEs and hands with intact sensation.  Mild leaning backwards when sitting at EOB but no complete LOB but had decreased awareness of this. He had some intermittent confusion when answering questions about his bathroom set up and other personal details which his girlfriend helped with.  He used his SPC to ambulate to the bathroom to urinate standing with CGA with mild sway when standing.  He was using AD for LB dressing since L knee replacement on 08-11-18 and would benefit from continued ADL training while in hospital to continue to work on increasing independence in ADLs, balance and functional mobility training, coordination exercises and family ed and training and OT [facility] after  discharge.    Follow Up Recommendations  Home health OT    Equipment Recommendations  Tub/shower seat    Recommendations for Other Services       Precautions / Restrictions Precautions Precautions: Fall;Knee Precaution Comments: Recent L TKA Restrictions Weight Bearing Restrictions: No      Mobility Bed Mobility Overal bed mobility: Independent                Transfers Overall transfer level: Needs assistance Equipment used: Straight cane Transfers: Sit to/from Stand Sit to Stand: Supervision         General transfer comment: Difficulty in standing; able to stand from bedside with 1-2 attempts and bed elevated.  Pt unsteady on feet and slow to stand upright.    Balance Overall balance assessment: Needs assistance Sitting-balance support: Bilateral upper extremity supported;Feet supported Sitting balance-Leahy Scale: Fair Sitting balance - Comments: Pt is able to sit upright without UE support but prefers to use UE for support due to fatigue. Postural control: Posterior lean Standing balance support: Single extremity supported Standing balance-Leahy Scale: Fair Standing balance comment: Able to stand and simulate opening cabinets doors bilaterally, bend to lift object with use of SPC for support.  No LOB's.  Would prefer that pt be supervised when performing standing functional tasks at this time.                           ADL either performed or assessed with clinical judgement  ADL Overall ADL's : Needs assistance/impaired Eating/Feeding: Independent;Set up   Grooming: Wash/dry hands;Wash/dry face;Oral care;Applying deodorant;Set up;Independent           Upper Body Dressing : Independent;Set up   Lower Body Dressing: Set up;Minimal assistance;With adaptive equipment;Sit to/from stand   Toilet Transfer: Regular Toilet;Supervision/safety;Stand-pivot Toilet Transfer Details (indicate cue type and reason): mild decrease in balance when  standing to urinate in toilet and bumped into wall on left one time but no LOB entirely         Functional mobility during ADLs: Cane;Supervision/safety       Vision Baseline Vision/History: Glaucoma;Macular Degeneration Patient Visual Report: No change from baseline Additional Comments: baseline screening to assess vision with Amsler grid and no change in shape of lines with one eye closed or any missing areas on grid reported by patient.  Decreased upward tracking but when asked to look up to ceiling he had full ROM for upward gaze.  Intact peripheral vision.     Perception     Praxis      Pertinent Vitals/Pain Pain Assessment: No/denies pain Faces Pain Scale: Hurts a little bit Pain Location: L knee and HA Pain Intervention(s): Monitored during session;RN gave pain meds during session     Hand Dominance Right   Extremity/Trunk Assessment Upper Extremity Assessment Upper Extremity Assessment: Overall WFL for tasks assessed(c/o intermittent numbness in L hand that comes and goes and rec he talk to his doctor about imaging for his cervical area if it persists )   Lower Extremity Assessment Lower Extremity Assessment: Defer to PT evaluation   Cervical / Trunk Assessment Cervical / Trunk Assessment: Normal   Communication Communication Communication: No difficulties   Cognition Arousal/Alertness: Awake/alert Behavior During Therapy: WFL for tasks assessed/performed Overall Cognitive Status: Impaired/Different from baseline Area of Impairment: Memory                     Memory: Decreased short-term memory         General Comments: [name] significant other stated that he has had difficulty remembering things like her phone number.  She stated that this has gotten better but that he is still showing mild deficits today.  Pt needed prompting to respond to questions intermittently.   General Comments       Exercises     Shoulder Instructions      Home  Living Family/patient expects to be discharged to:: Private residence Living Arrangements: Alone Available Help at Discharge: Family;Available PRN/intermittently Type of Home: House Home Access: Stairs to enter [employer] of Steps: 1 Entrance Stairs-Rails: None Home Layout: One level     Bathroom Shower/Tub: Tub/shower unit;Curtain   Bathroom Toilet: Handicapped height Bathroom Accessibility: Yes   Home Equipment: Environmental consultant - 2 wheels;Cane - single point;Bedside commode;Adaptive equipment Adaptive Equipment: Reacher;Sock aid        Prior Functioning/Environment Level of Independence: Independent with assistive device(s)        Comments: has weaned to [facility] recently following RW use s/p TKA.        OT Problem List: Impaired vision/perception;Decreased activity tolerance      OT Treatment/Interventions: Self-care/ADL training;Visual/perceptual remediation/compensation;Patient/family education;Balance training;DME and/or AE instruction    OT Goals(Current goals can be found in the care plan section) Acute Rehab OT Goals Patient Stated Goal: To return to general daily activity with as much independence as possible. OT Goal Formulation: With patient/family Time For Goal Achievement: 09/21/18 Potential to Achieve Goals: Good ADL Goals Pt Will Perform Lower  Body Dressing: with supervision;with adaptive equipment;sit to/from stand  OT Frequency: Min 1X/week   Barriers to D/C:    was living at home alone but has a significant other to help as needed but they do not live together       Co-evaluation              AM-PAC PT "6 Clicks" Daily Activity     Outcome Measure Help from another person eating meals?: None Help from another person taking care of personal grooming?: None Help from another person toileting, which includes using toliet, bedpan, or urinal?: A Little Help from another person bathing (including washing, rinsing, drying)?: A Little Help from  another person to put on and taking off regular upper body clothing?: None Help from another person to put on and taking off regular lower body clothing?: A Little 6 Click Score: 21   End of Session    Activity Tolerance: Patient tolerated treatment well Patient left: in bed;with call bell/phone within reach;with bed alarm set;with family/visitor present  OT Visit Diagnosis: Unsteadiness on feet (R26.81);Low vision, both eyes (H54.2)                Time: 1450-1530 OT Time Calculation (min): 40 min Charges:  OT General Charges $OT Visit: 1 Visit OT Evaluation $OT Eval Low Complexity: 1 Low OT Treatments $Self Care/Home Management : 23-37 mins  Susanne Borders, OTR/L ascom [phone] 09/07/18, 3:57 PM

## 2018-09-07 NOTE — Progress Notes (Signed)
Pt is being discharged home.  Discharge papers given and explained to pt and pt's girlfriend, Katha Hamming.  Meds and f/u appointments reviewed.  RX sent to pharmacy. Pt made aware.

## 2018-09-07 NOTE — Progress Notes (Signed)
PT Cancellation Note  Patient Details Name: Harold Sandoval MRN: [id number] DOB: [birth year]   Cancelled Treatment:    Reason Eval/Treat Not Completed: Patient at procedure or test/unavailable.  Nursing and imaging in pt's room as PT arrived.  Will re-attempt when pt is available.   Glenetta Hew, PT, DPT 09/07/2018, 10:34 AM

## 2018-09-07 NOTE — Progress Notes (Signed)
OT Cancellation Note  Patient Details Name: Harold Sandoval MRN: [id number] DOB: 10-13-1941   Cancelled Treatment:    Reason Eval/Treat Not Completed: Patient at procedure or test/ unavailable.  Order received for OT evaluation and chart reviewed.  Met with pt's significant other who was in the room and stated he was at imaging.  She stated he had a knee surgery 3 weeks ago at [facility] which she was reassured that the team knew about this and would take good care of him.  Will attempt evaluation again in the afternoon.  Thank you for the referral.  Chrys Racer, OTR/L ascom [phone] 09/07/18, 11:12 AM

## 2018-09-23 ENCOUNTER — Ambulatory Visit
Admission: RE | Admit: 2018-09-23 | Discharge: 2018-09-23 | Disposition: A | Discharge status: Home / Self Care | Payer: Medicare HMO | Source: Ambulatory Visit | Attending: Physician Assistant | Admitting: Physician Assistant

## 2018-09-23 ENCOUNTER — Other Ambulatory Visit: Payer: Self-pay | Admitting: Physician Assistant

## 2018-09-23 DIAGNOSIS — K5730 Diverticulosis of large intestine without perforation or abscess without bleeding: Secondary | ICD-10-CM | POA: Diagnosis not present

## 2018-09-23 DIAGNOSIS — R1032 Left lower quadrant pain: Secondary | ICD-10-CM | POA: Insufficient documentation

## 2018-09-23 MED ORDER — IOPAMIDOL (ISOVUE-300) INJECTION 61%
100.0000 mL | Freq: Once | INTRAVENOUS | Status: AC | PRN
  Administered 2018-09-23: 100 mL via INTRAVENOUS

## 2018-10-01 ENCOUNTER — Encounter: Payer: Self-pay | Admitting: Internal Medicine

## 2018-12-18 ENCOUNTER — Other Ambulatory Visit: Payer: Self-pay

## 2018-12-18 ENCOUNTER — Emergency Department: Payer: Medicare HMO

## 2018-12-18 ENCOUNTER — Encounter: Payer: Self-pay | Admitting: Emergency Medicine

## 2018-12-18 DIAGNOSIS — Z8673 Personal history of transient ischemic attack (TIA), and cerebral infarction without residual deficits: Secondary | ICD-10-CM | POA: Insufficient documentation

## 2018-12-18 DIAGNOSIS — I480 Paroxysmal atrial fibrillation: Secondary | ICD-10-CM | POA: Insufficient documentation

## 2018-12-18 DIAGNOSIS — E1140 Type 2 diabetes mellitus with diabetic neuropathy, unspecified: Secondary | ICD-10-CM | POA: Diagnosis not present

## 2018-12-18 DIAGNOSIS — I10 Essential (primary) hypertension: Secondary | ICD-10-CM | POA: Insufficient documentation

## 2018-12-18 DIAGNOSIS — Z794 Long term (current) use of insulin: Secondary | ICD-10-CM | POA: Diagnosis not present

## 2018-12-18 DIAGNOSIS — Z7982 Long term (current) use of aspirin: Secondary | ICD-10-CM | POA: Diagnosis not present

## 2018-12-18 DIAGNOSIS — E7800 Pure hypercholesterolemia, unspecified: Secondary | ICD-10-CM | POA: Insufficient documentation

## 2018-12-18 DIAGNOSIS — G459 Transient cerebral ischemic attack, unspecified: Secondary | ICD-10-CM | POA: Diagnosis present

## 2018-12-18 DIAGNOSIS — Z79899 Other long term (current) drug therapy: Secondary | ICD-10-CM | POA: Diagnosis not present

## 2018-12-18 DIAGNOSIS — H409 Unspecified glaucoma: Secondary | ICD-10-CM | POA: Insufficient documentation

## 2018-12-18 DIAGNOSIS — M109 Gout, unspecified: Secondary | ICD-10-CM | POA: Insufficient documentation

## 2018-12-18 DIAGNOSIS — R262 Difficulty in walking, not elsewhere classified: Secondary | ICD-10-CM | POA: Diagnosis not present

## 2018-12-18 DIAGNOSIS — Z7901 Long term (current) use of anticoagulants: Secondary | ICD-10-CM | POA: Diagnosis not present

## 2018-12-18 DIAGNOSIS — I672 Cerebral atherosclerosis: Secondary | ICD-10-CM | POA: Insufficient documentation

## 2018-12-18 DIAGNOSIS — G9389 Other specified disorders of brain: Secondary | ICD-10-CM | POA: Diagnosis not present

## 2018-12-18 DIAGNOSIS — R200 Anesthesia of skin: Secondary | ICD-10-CM | POA: Diagnosis present

## 2018-12-18 LAB — CBC WITH DIFFERENTIAL/PLATELET
Abs Immature Granulocytes: 0.01 10*3/uL (ref 0.00–0.07)
Basophils Absolute: 0.1 10*3/uL (ref 0.0–0.1)
Basophils Relative: 1 %
Eosinophils Absolute: 0.2 10*3/uL (ref 0.0–0.5)
Eosinophils Relative: 3 %
HCT: 46.6 % (ref 39.0–52.0)
Hemoglobin: 15.2 g/dL (ref 13.0–17.0)
Immature Granulocytes: 0 %
Lymphocytes Relative: 40 %
Lymphs Abs: 2.7 10*3/uL (ref 0.7–4.0)
MCH: 29.6 pg (ref 26.0–34.0)
MCHC: 32.6 g/dL (ref 30.0–36.0)
MCV: 90.7 fL (ref 80.0–100.0)
Monocytes Absolute: 0.4 10*3/uL (ref 0.1–1.0)
Monocytes Relative: 6 %
Neutro Abs: 3.5 10*3/uL (ref 1.7–7.7)
Neutrophils Relative %: 50 %
Platelets: 130 10*3/uL — ABNORMAL LOW (ref 150–400)
RBC: 5.14 MIL/uL (ref 4.22–5.81)
RDW: 13.2 % (ref 11.5–15.5)
Smear Review: UNDETERMINED
WBC: 6.9 10*3/uL (ref 4.0–10.5)
nRBC: 0 % (ref 0.0–0.2)

## 2018-12-18 LAB — BASIC METABOLIC PANEL WITH GFR
Anion gap: 8 (ref 5–15)
BUN: 14 mg/dL (ref 8–23)
CO2: 30 mmol/L (ref 22–32)
Calcium: 9.9 mg/dL (ref 8.9–10.3)
Chloride: 104 mmol/L (ref 98–111)
Creatinine, Ser: 0.9 mg/dL (ref 0.61–1.24)
GFR calc Af Amer: >60 mL/min
GFR calc non Af Amer: >60 mL/min
Glucose, Bld: 127 mg/dL — ABNORMAL HIGH (ref 70–99)
Potassium: 4.1 mmol/L (ref 3.5–5.1)
Sodium: 142 mmol/L (ref 135–145)

## 2018-12-18 LAB — TROPONIN I: Troponin I: <0.03 ng/mL

## 2018-12-18 LAB — PROTIME-INR
INR: 1.1
Prothrombin Time: 14.1 s (ref 11.4–15.2)

## 2018-12-18 LAB — ETHANOL: Alcohol, Ethyl (B): <10 mg/dL

## 2018-12-18 NOTE — ED Triage Notes (Signed)
Pt arrives ambulatory to ED with c/o left arm numbness which has been "coming and going" since around 1700. Pt has hx of stroke x 2 but reports that they presented with aphasia and not numbness. Pt has clear speech with bilateral strong hand grips at this time in triage. Pt is able to move all extremities equally and has no neurological deficits observed by this RN at this time.

## 2018-12-19 ENCOUNTER — Observation Stay: Payer: Medicare HMO

## 2018-12-19 ENCOUNTER — Observation Stay
Admission: EM | Admit: 2018-12-19 | Discharge: 2018-12-21 | Disposition: A | Discharge status: Home Health | Payer: Medicare HMO | Attending: Internal Medicine | Admitting: Internal Medicine

## 2018-12-19 ENCOUNTER — Observation Stay
Admit: 2018-12-19 | Discharge: 2018-12-19 | Disposition: A | Discharge status: Home / Self Care | Payer: Medicare HMO | Attending: Internal Medicine | Admitting: Internal Medicine

## 2018-12-19 DIAGNOSIS — R200 Anesthesia of skin: Secondary | ICD-10-CM

## 2018-12-19 DIAGNOSIS — I639 Cerebral infarction, unspecified: Secondary | ICD-10-CM | POA: Diagnosis not present

## 2018-12-19 DIAGNOSIS — G459 Transient cerebral ischemic attack, unspecified: Secondary | ICD-10-CM | POA: Diagnosis present

## 2018-12-19 LAB — LIPID PANEL
Cholesterol: 103 mg/dL (ref 0–200)
HDL: 47 mg/dL
LDL Cholesterol: 47 mg/dL (ref 0–99)
Total CHOL/HDL Ratio: 2.2 ratio
Triglycerides: 47 mg/dL
VLDL: 9 mg/dL (ref 0–40)

## 2018-12-19 LAB — GLUCOSE, CAPILLARY
Glucose-Capillary: 101 mg/dL — ABNORMAL HIGH (ref 70–99)
Glucose-Capillary: 111 mg/dL — ABNORMAL HIGH (ref 70–99)
Glucose-Capillary: 103 mg/dL — ABNORMAL HIGH (ref 70–99)
Glucose-Capillary: 117 mg/dL — ABNORMAL HIGH (ref 70–99)

## 2018-12-19 LAB — TSH: TSH: 1.703 u[IU]/mL (ref 0.350–4.500)

## 2018-12-19 MED ORDER — APIXABAN 5 MG PO TABS
5.0000 mg | ORAL_TABLET | Freq: Two times a day (BID) | ORAL | Status: DC
  Administered 2018-12-19 – 2018-12-21 (×5): 5 mg via ORAL
  Filled 2018-12-19 (×5): qty 1

## 2018-12-19 MED ORDER — ATORVASTATIN CALCIUM 20 MG PO TABS
80.0000 mg | ORAL_TABLET | Freq: Every evening | ORAL | Status: DC
  Administered 2018-12-19 – 2018-12-20 (×2): 80 mg via ORAL
  Filled 2018-12-19 (×2): qty 4

## 2018-12-19 MED ORDER — POLYETHYLENE GLYCOL 3350 17 G PO PACK
17.0000 g | PACK | Freq: Every day | ORAL | Status: DC
  Administered 2018-12-19 – 2018-12-21 (×3): 17 g via ORAL
  Filled 2018-12-19 (×3): qty 1

## 2018-12-19 MED ORDER — ACETAMINOPHEN 650 MG RE SUPP: 650.0000 mg | Freq: Four times a day (QID) | RECTAL | Status: DC | PRN

## 2018-12-19 MED ORDER — ENOXAPARIN SODIUM 40 MG/0.4ML ~~LOC~~ SOLN: 40.0000 mg | SUBCUTANEOUS | Status: DC

## 2018-12-19 MED ORDER — BRINZOLAMIDE 1 % OP SUSP
1.0000 [drp] | Freq: Two times a day (BID) | OPHTHALMIC | Status: DC
  Administered 2018-12-19 – 2018-12-21 (×4): 1 [drp] via OPHTHALMIC
  Filled 2018-12-19: qty 10

## 2018-12-19 MED ORDER — ONDANSETRON HCL 4 MG/2ML IJ SOLN: 4.0000 mg | Freq: Four times a day (QID) | INTRAMUSCULAR | Status: DC | PRN

## 2018-12-19 MED ORDER — FLUTICASONE PROPIONATE 50 MCG/ACT NA SUSP
2.0000 | Freq: Every day | NASAL | Status: DC
  Administered 2018-12-19 – 2018-12-21 (×3): 2 via NASAL
  Filled 2018-12-19: qty 16

## 2018-12-19 MED ORDER — LATANOPROST 0.005 % OP SOLN
1.0000 [drp] | Freq: Every day | OPHTHALMIC | Status: DC
  Administered 2018-12-19 – 2018-12-20 (×2): 1 [drp] via OPHTHALMIC
  Filled 2018-12-19: qty 2.5

## 2018-12-19 MED ORDER — ONDANSETRON HCL 4 MG PO TABS: 4.0000 mg | ORAL_TABLET | Freq: Four times a day (QID) | ORAL | Status: DC | PRN

## 2018-12-19 MED ORDER — INSULIN ASPART 100 UNIT/ML ~~LOC~~ SOLN: 0.0000 [IU] | Freq: Every day | SUBCUTANEOUS | Status: DC

## 2018-12-19 MED ORDER — ACETAMINOPHEN 325 MG PO TABS
650.0000 mg | ORAL_TABLET | Freq: Four times a day (QID) | ORAL | Status: DC | PRN
  Administered 2018-12-20: 05:00:00 650 mg via ORAL
  Filled 2018-12-19 (×2): qty 2

## 2018-12-19 MED ORDER — INSULIN ASPART 100 UNIT/ML ~~LOC~~ SOLN
0.0000 [IU] | Freq: Three times a day (TID) | SUBCUTANEOUS | Status: DC
  Administered 2018-12-20: 18:00:00 1 [IU] via SUBCUTANEOUS
  Filled 2018-12-19 (×2): qty 1

## 2018-12-19 MED ORDER — DOCUSATE SODIUM 100 MG PO CAPS
100.0000 mg | ORAL_CAPSULE | Freq: Two times a day (BID) | ORAL | Status: DC
  Administered 2018-12-19 – 2018-12-21 (×4): 100 mg via ORAL
  Filled 2018-12-19 (×5): qty 1

## 2018-12-19 MED ORDER — BRIMONIDINE TARTRATE 0.2 % OP SOLN
1.0000 [drp] | Freq: Two times a day (BID) | OPHTHALMIC | Status: DC
  Administered 2018-12-19 – 2018-12-21 (×4): 1 [drp] via OPHTHALMIC
  Filled 2018-12-19: qty 5

## 2018-12-19 MED ORDER — ASPIRIN EC 81 MG PO TBEC
81.0000 mg | DELAYED_RELEASE_TABLET | Freq: Every day | ORAL | Status: DC
  Administered 2018-12-19 – 2018-12-21 (×3): 81 mg via ORAL
  Filled 2018-12-19 (×4): qty 1

## 2018-12-19 MED ORDER — STROKE: EARLY STAGES OF RECOVERY BOOK
Freq: Once | Status: AC
  Administered 2018-12-19: 05:00:00

## 2018-12-19 NOTE — H&P (Signed)
Harold Sandoval is an 78 y.o. male.   Chief Complaint: Arm pain HPI: The patient with past medical history of stroke, diabetes and hyperlipidemia presents to the emergency department complaining of left upper extremity pain.  The patient reports that an aching sensation comes and goes in his left hand and radiates into his forearm and axilla.  The patient denies chest pain, shortness of breath, difficulty swallowing or speaking, changes in vision or headache.  The aching sensation eventually becomes numbness starting with his fifth and fourth digits then encompassing his forearm and dorsal hand.  The pain lasts approximately 3 minutes and may abate for 5 minutes before returning.  After the pain resolves the patient admits to a heavy feeling in his left arm and slight weakness.  CT of his head was negative for acute stroke but due to his history of cerebrovascular accidents and intermittent symptoms the emergency department staff asked the hospitalist service for further evaluation.  Past Medical History:  Diagnosis Date  . Arthritis    "all over" (02/10/2018)  . Balance problem    when first stands.  Since stroke.  . Constipation   . 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 Degenerative joint disease (DJD) of hip   . Diabetic neuropathy (HCC)   . Diabetic neuropathy (HCC)    fingers  . 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 Glaucoma, both eyes   . High cholesterol   . History of gout   . 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 Macular degeneration, left eye   . Sinus headache    resolved  . Type II diabetes mellitus (HCC)     Past Surgical History:  Procedure Laterality Date  . APPENDECTOMY    . BACK SURGERY    . CATARACT EXTRACTION, BILATERAL    . COLONOSCOPY WITH PROPOFOL N/A 12/29/2016   Procedure: COLONOSCOPY WITH PROPOFOL;  Surgeon: Midge Minium, MD;   Location: [facility];  Service: Endoscopy;  Laterality: N/A;  Diabetic - oral meds  . JOINT REPLACEMENT    . LAPAROSCOPIC CHOLECYSTECTOMY    . LOOP RECORDER INSERTION N/A 02/24/2017   Procedure: Loop Recorder Insertion;  Surgeon: Marcina Millard, MD;  Location: [facility];  Service: Cardiovascular;  Laterality: N/A;  . LUMBAR DISC SURGERY    . TOTAL HIP ARTHROPLASTY Left 02/10/2018   Procedure: TOTAL HIP ARTHROPLASTY ANTERIOR APPROACH;  Surgeon: Gean Birchwood, MD;  Location: [facility] OR;  Service: Orthopedics;  Laterality: Left;  . TOTAL KNEE ARTHROPLASTY Left 08/11/2018   Procedure: LEFT TOTAL KNEE ARTHROPLASTY;  Surgeon: Gean Birchwood, MD;  Location: [facility];  Service: Orthopedics;  Laterality: Left;    Family History  Problem Relation Age of Onset  . Cancer Mother   . Cancer Father    Social History:  reports that he has never smoked. He has never used smokeless tobacco. He reports that he does not drink alcohol or use drugs.  Allergies: No Known Allergies  Medications Prior to Admission  Medication Sig Dispense Refill  . apixaban (ELIQUIS) 5 MG TABS tablet Take 1 tablet (5 mg total) by mouth 2 (two) times daily. 60 tablet 2  . aspirin EC 81 MG tablet Take 1 tablet (81 mg total) by mouth daily. 60 tablet 0  . atorvastatin (LIPITOR) 80 MG tablet Take 80 mg by mouth every evening.     . brimonidine (ALPHAGAN)  0.2 % ophthalmic solution Place 1 drop into both eyes 2 (two) times daily.   0  . brinzolamide (AZOPT) 1 % ophthalmic suspension Place 1 drop into both eyes 2 (two) times daily.    . Cholecalciferol (VITAMIN D3) 2000 units capsule Take 2,000 Units by mouth daily.    Marland Kitchen. desonide (DESOWEN) 0.05 % lotion Apply 1 application topically 2 (two) times daily.    . fluticasone (FLONASE) 50 MCG/ACT nasal spray Place 2 sprays into both nostrils daily as needed for rhinitis.    Marland Kitchen. latanoprost (XALATAN) 0.005 % ophthalmic solution Place 1 drop into both eyes at bedtime.   0  .  metFORMIN (GLUCOPHAGE) 500 MG tablet Take 500 mg by mouth 2 (two) times daily with a meal.  11  . oxyCODONE-acetaminophen (PERCOCET/ROXICET) 5-325 MG tablet Take 1 tablet by mouth every 4 (four) hours as needed for severe pain. 30 tablet 0  . senna (SENOKOT) 8.6 MG tablet Take 2 tablets by mouth daily as needed for constipation.    Marland Kitchen. tiZANidine (ZANAFLEX) 2 MG tablet Take 1 tablet (2 mg total) by mouth every 6 (six) hours as needed. (Patient taking differently: Take 2 mg by mouth 2 (two) times daily. ) 60 tablet 0    Results for orders placed or performed during the hospital encounter of 12/19/18 (from the past 48 hour(s))  Troponin I - ONCE - STAT     Status: None   Collection Time: 12/18/18  9:04 PM  Result Value Ref Range   Troponin I <0.03 <0.03 ng/mL    Comment: Performed at [facility], [address] [place], [place] [address]  CBC with Differential     Status: Abnormal   Collection Time: 12/18/18  9:04 PM  Result Value Ref Range   WBC 6.9 4.0 - 10.5 K/uL   RBC 5.14 4.22 - 5.81 MIL/uL   Hemoglobin 15.2 13.0 - 17.0 g/dL   HCT 60.446.6 54.039.0 - 98.152.0 %   MCV 90.7 80.0 - 100.0 fL   MCH 29.6 26.0 - 34.0 pg   MCHC 32.6 30.0 - 36.0 g/dL   RDW 19.113.2 47.811.5 - 29.515.5 %   Platelets 130 (L) 150 - 400 K/uL   nRBC 0.0 0.0 - 0.2 %   Neutrophils Relative % 50 %   Neutro Abs 3.5 1.7 - 7.7 K/uL   Lymphocytes Relative 40 %   Lymphs Abs 2.7 0.7 - 4.0 K/uL   Monocytes Relative 6 %   Monocytes Absolute 0.4 0.1 - 1.0 K/uL   Eosinophils Relative 3 %   Eosinophils Absolute 0.2 0.0 - 0.5 K/uL   Basophils Relative 1 %   Basophils Absolute 0.1 0.0 - 0.1 K/uL   Smear Review PLATELET CLUMPS NOTED ON SMEAR, UNABLE TO ESTIMATE    Immature Granulocytes 0 %   Abs Immature Granulocytes 0.01 0.00 - 0.07 K/uL    Comment: Performed at [facility], [address] [place], [place] [address]  Basic metabolic panel     Status: Abnormal   Collection Time: 12/18/18  9:04 PM  Result Value Ref Range    Sodium 142 135 - 145 mmol/L   Potassium 4.1 3.5 - 5.1 mmol/L    Comment: HEMOLYSIS AT THIS LEVEL MAY AFFECT RESULT   Chloride 104 98 - 111 mmol/L   CO2 30 22 - 32 mmol/L   Glucose, Bld 127 (H) 70 - 99 mg/dL   BUN 14 8 - 23 mg/dL   Creatinine, Ser 8.650.90 0.61 - 1.24 mg/dL  Calcium 9.9 8.9 - 10.3 mg/dL   GFR calc non Af Amer >60 >60 mL/min   GFR calc Af Amer >60 >60 mL/min   Anion gap 8 5 - 15    Comment: Performed at [facility], [address] [place], [place] [address]  Ethanol     Status: None   Collection Time: 12/18/18  9:04 PM  Result Value Ref Range   Alcohol, Ethyl (B) <10 <10 mg/dL    Comment: (NOTE) Lowest detectable limit for serum alcohol is 10 mg/dL. For medical purposes only. Performed at [facility], [address] [place], [place] [address]   Protime-INR     Status: None   Collection Time: 12/18/18  9:04 PM  Result Value Ref Range   Prothrombin Time 14.1 11.4 - 15.2 seconds   INR 1.10     Comment: Performed at [facility], [address] [place], [place] [address]   Dg Chest 1 View  Result Date: 12/18/2018 CLINICAL DATA:  Left arm numbness since around 1700 hours. Previous history of stroke. EXAM: CHEST  1 VIEW COMPARISON:  02/03/2018 FINDINGS: Hyperinflation suggesting emphysema. Normal heart size and pulmonary vascularity. No focal airspace disease or consolidation in the lungs. No blunting of costophrenic angles. No pneumothorax. Mediastinal contours appear intact. Loop recorder is present. Calcification of the aorta. Degenerative changes in the spine and shoulders. IMPRESSION: Emphysematous changes in the lungs. No evidence of active pulmonary disease. Electronically Signed   By: Burman Nieves M.D.   On: 12/18/2018 21:33   Ct Head Wo Contrast  Result Date: 12/18/2018 CLINICAL DATA:  Acute onset of LEFT UPPER EXTREMITY numbness that began around 5 o'clock p.m. this afternoon. Personal history of stroke. EXAM: CT HEAD  WITHOUT CONTRAST TECHNIQUE: Contiguous axial images were obtained from the base of the skull through the vertex without intravenous contrast. COMPARISON:  MRI brain 09/07/2018. CT head 09/06/2018. FINDINGS: Brain: Ventricular system normal in size and appearance for age. Remote LEFT parietal cortical stroke with encephalomalacia, unchanged. Remote lacunar stroke in the LEFT basal ganglia, unchanged. Moderate changes of small vessel disease of the white matter diffusely, unchanged. No mass lesion. No midline shift. No acute hemorrhage or hematoma. No extra-axial fluid collections. No evidence of acute infarction. 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 No hyperdense vessel. Skull: No skull fracture or other focal osseous abnormality involving the skull. Sinuses/Orbits: Visualized paranasal sinuses, bilateral mastoid air cells and bilateral middle ear cavities well-aerated. Visualized orbits and globes normal in appearance. Other: None. IMPRESSION: 1. No acute intracranial abnormality. 2. Stable old LEFT parietal cortical stroke with encephalomalacia and old LEFT basal ganglia lacunar stroke. 3. Stable moderate chronic microvascular ischemic changes of the white matter. Electronically Signed   By: Hulan Saas M.D.   On: 12/18/2018 21:39    Review of Systems  Constitutional: Negative for chills and fever.  HENT: Negative for sore throat and tinnitus.   Eyes: Negative for blurred vision and redness.  Respiratory: Negative for cough and shortness of breath.   Cardiovascular: Negative for chest pain, palpitations, orthopnea and PND.  Gastrointestinal: Negative for abdominal pain, diarrhea, nausea and vomiting.  Genitourinary: Negative for dysuria, frequency and urgency.  Musculoskeletal: Negative for joint pain and myalgias.  Skin: Negative for rash.       No lesions  Neurological: Positive for focal weakness. Negative for speech change and weakness.   Endo/Heme/Allergies: Does not bruise/bleed easily.       No temperature intolerance  Psychiatric/Behavioral:  Negative for depression and suicidal ideas.    Blood pressure 133/82, pulse 85, temperature 98 F (36.7 C), temperature source Oral, resp. rate 18, height 6\' 4"  (1.93 m), weight 83.9 kg, SpO2 99 %. Physical Exam  Vitals reviewed. Constitutional: He is oriented to person, place, and time. He appears well-developed and well-nourished. No distress.  HENT:  Head: Normocephalic and atraumatic.  Mouth/Throat: Oropharynx is clear and moist.  Eyes: Pupils are equal, round, and reactive to light. Conjunctivae and EOM are normal. No scleral icterus.  Neck: Normal range of motion. Neck supple. No JVD present. Carotid bruit is not present. No tracheal deviation present. No thyromegaly present.  Cardiovascular: Normal rate, regular rhythm and normal heart sounds. Exam reveals no gallop and no friction rub.  No murmur heard. Respiratory: Effort normal and breath sounds normal. No respiratory distress.  GI: Soft. Bowel sounds are normal. He exhibits no distension. There is no abdominal tenderness.  Genitourinary:    Genitourinary Comments: Deferred   Musculoskeletal: Normal range of motion.        General: No edema.  Lymphadenopathy:    He has no cervical adenopathy.  Neurological: He is alert and oriented to person, place, and time. No cranial nerve deficit.  Skin: Skin is warm and dry. No rash noted. No erythema.  Psychiatric: He has a normal mood and affect. His behavior is normal. Judgment and thought content normal.     Assessment/Plan This is a 78 year old male admitted for TIA. 1.  TIA: Symptoms currently resolved.  Obtain MRI and MRA of the brain.  Carotid ultrasounds as well as echocardiogram ordered.  Consult neurology.  Monitor blood pressure. 2.  Diabetes mellitus type 2: Hold oral diabetic agents.  Sliding scale insulin while hospitalized 3.  Hyperlipidemia: Continue statin  therapy 4.  Glaucoma: Continue eyedrops 5.  DVT prophylaxis: Eliquis 6.  GI prophylaxis: None The patient is a full code.  Time spent on admission orders and patient care approximately 45 minutes  Arnaldo Nataliamond,  Zayveon Raschke S, MD 12/19/2018, 6:30 AM

## 2018-12-19 NOTE — ED Provider Notes (Signed)
[facility] Emergency Department Provider Note ____________________________________________   First MD Initiated Contact with Patient 12/19/18 0141     (approximate)  I have reviewed the triage vital signs and the nursing notes.   HISTORY  Chief Complaint Numbness    HPI Harold Sandoval is a 78 y.o. male with PMH as noted below including prior history of stroke who presents with left arm numbness and some weakness, acute onset around 5 PM yesterday afternoon, and waxing and waning since that time.  It is mainly from around the elbow down to the hand and described as a tingling and altered sensation.  He denies any prior history of this symptom.  He denies any injury to the arm or neck.  He has no neck pain.  He denies any other numbness or weakness, speech disturbance, vision changes, or other acute neurologic symptoms.   Past Medical History:  Diagnosis Date  . Arthritis    "all over" (02/10/2018)  . Balance problem    when first stands.  Since stroke.  . Constipation   . 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 Degenerative joint disease (DJD) of hip   . Diabetic neuropathy (HCC)   . Diabetic neuropathy (HCC)    fingers  . 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 Glaucoma, both eyes   . High cholesterol   . History of gout   . 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 Macular degeneration, left eye   . Sinus headache    resolved  . Type II diabetes mellitus [facility])     Patient Active Problem List   Diagnosis Date Noted  . TIA (transient ischemic attack) 12/19/2018  . Acute CVA (cerebrovascular accident) (HCC) 09/06/2018  . Primary osteoarthritis of left knee 08/11/2018  . Degenerative arthritis of left knee 08/10/2018  . 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 Osteoarthritis of left hip 02/09/2018  . Constipation     . Change in bowel habits   . Chest pain 05/12/2016  . Strain of rectus abdominis muscle 08/01/2015    Past Surgical History:  Procedure Laterality Date  . APPENDECTOMY    . BACK SURGERY    . CATARACT EXTRACTION, BILATERAL    . COLONOSCOPY WITH PROPOFOL N/A 12/29/2016   Procedure: COLONOSCOPY WITH PROPOFOL;  Surgeon: Midge Minium, MD;  Location: [facility];  Service: Endoscopy;  Laterality: N/A;  Diabetic - oral meds  . JOINT REPLACEMENT    . LAPAROSCOPIC CHOLECYSTECTOMY    . LOOP RECORDER INSERTION N/A 02/24/2017   Procedure: Loop Recorder Insertion;  Surgeon: Marcina Millard, MD;  Location: [facility] INVASIVE CV LAB;  Service: Cardiovascular;  Laterality: N/A;  . LUMBAR DISC SURGERY    . TOTAL HIP ARTHROPLASTY Left 02/10/2018   Procedure: TOTAL HIP ARTHROPLASTY ANTERIOR APPROACH;  Surgeon: Gean Birchwood, MD;  Location: [facility] OR;  Service: Orthopedics;  Laterality: Left;  . TOTAL KNEE ARTHROPLASTY Left 08/11/2018   Procedure: LEFT TOTAL KNEE ARTHROPLASTY;  Surgeon: Gean Birchwood, MD;  Location: [facility] ORS;  Service: Orthopedics;  Laterality: Left;    Prior to Admission medications   Medication Sig Start Date End Date Taking? Authorizing Provider  apixaban (ELIQUIS) 5 MG TABS tablet Take 1 tablet (5 mg total) by mouth 2 (two) times daily. 09/07/18   Enid Baas, MD  aspirin EC 81 MG tablet Take 1 tablet (81 mg total) by  mouth daily. 09/07/18   Enid BaasKalisetti, Radhika, MD  atorvastatin (LIPITOR) 80 MG tablet Take 80 mg by mouth every evening.     [provider]  brimonidine (ALPHAGAN) 0.2 % ophthalmic solution Place 1 drop into both eyes 2 (two) times daily.     [provider]  brinzolamide (AZOPT) 1 % ophthalmic suspension Place 1 drop into both eyes 2 (two) times daily.    [provider]  Cholecalciferol (VITAMIN D3) 2000 units capsule Take 2,000 Units by mouth daily.    [provider]  desonide (DESOWEN) 0.05 % lotion Apply 1 application topically 2  (two) times daily.    [provider]  fluticasone (FLONASE) 50 MCG/ACT nasal spray Place 2 sprays into both nostrils daily as needed for rhinitis.    [provider]  latanoprost (XALATAN) 0.005 % ophthalmic solution Place 1 drop into both eyes at bedtime.     [provider]  metFORMIN (GLUCOPHAGE) 500 MG tablet Take 500 mg by mouth 2 (two) times daily with a meal. 08/03/18   [provider]  oxyCODONE-acetaminophen (PERCOCET/ROXICET) 5-325 MG tablet Take 1 tablet by mouth every 4 (four) hours as needed for severe pain. 08/11/18   Allena KatzPhillips, Eric K, PA-C  senna (SENOKOT) 8.6 MG tablet Take 2 tablets by mouth daily as needed for constipation.    [provider]  tiZANidine (ZANAFLEX) 2 MG tablet Take 1 tablet (2 mg total) by mouth every 6 (six) hours as needed. Patient taking differently: Take 2 mg by mouth 2 (two) times daily.  08/11/18   Allena KatzPhillips, Eric K, PA-C    Allergies Patient has no known allergies.  Family History  Problem Relation Age of Onset  . Cancer Mother   . Cancer Father     Social History Social History   Tobacco Use  . Smoking status: Never Smoker  . Smokeless tobacco: Never Used  Substance Use Topics  . Alcohol use: No    Alcohol/week: 0.0 standard drinks  . Drug use: No    Review of Systems  Constitutional: No fever. Eyes: No visual changes. ENT: No neck pain. Cardiovascular: Denies chest pain. Respiratory: Denies shortness of breath. Gastrointestinal: No vomiting.  Genitourinary: Negative for flank pain.  Musculoskeletal: Negative for back pain. Skin: Negative for rash. Neurological: Negative for headaches.  Positive for left arm numbness.   ____________________________________________   PHYSICAL EXAM:  VITAL SIGNS: ED Triage Vitals  Enc Vitals Group     BP 12/18/18 2058 (!) 144/81     Pulse Rate 12/18/18 2058 97     Resp 12/18/18 2058 18     Temp 12/18/18 2058 98.2 F (36.8 C)     Temp src --       SpO2 12/18/18 2058 99 %     Weight 12/18/18 2056 185 lb (83.9 kg)     Height 12/18/18 2056 6\' 4"  (1.93 m)     Head Circumference --      Peak Flow --      Pain Score 12/18/18 2056 1     Pain Loc --      Pain Edu? --      Excl. in GC? --     Constitutional: Alert and oriented.  Relatively well appearing and in no acute distress. Eyes: Conjunctivae are normal.  EOMI.  PERRLA.   Head: Atraumatic. Nose: No congestion/rhinnorhea. Mouth/Throat: Mucous membranes are moist.   Neck: Normal range of motion.  Cardiovascular: Normal rate, regular rhythm. Grossly normal heart sounds.  Good peripheral circulation. Respiratory: Normal respiratory effort.  No retractions. Lungs CTAB. Gastrointestinal: No distention.  Musculoskeletal: Extremities warm and well perfused.  Neurologic:  Normal speech and language.  Motor and sensory intact in all extremities.  Cranial nerves III through XII intact.  Normal coordination with no ataxia.  No neglect.   Skin:  Skin is warm and dry. No rash noted. Psychiatric: Mood and affect are normal. Speech and behavior are normal.  ____________________________________________   LABS (all labs ordered are listed, but only abnormal results are displayed)  Labs Reviewed  CBC WITH DIFFERENTIAL/PLATELET - Abnormal; Notable for the following components:      Result Value   Platelets 130 (*)    All other components within normal limits  BASIC METABOLIC PANEL - Abnormal; Notable for the following components:   Glucose, Bld 127 (*)    All other components within normal limits  TROPONIN I  ETHANOL  PROTIME-INR  LIPID PANEL  TSH   ____________________________________________  EKG  ED ECG REPORT I, Dionne Bucy, the attending physician, personally viewed and interpreted this ECG.  Date: 12/19/2018 EKG Time: 2057 Rate: 91 Rhythm: normal sinus rhythm QRS Axis: normal Intervals: normal ST/T Wave abnormalities: normal Narrative Interpretation: no evidence  of acute ischemia  ____________________________________________  RADIOLOGY  CT head: No acute abnormality CXR: No acute abnormalities ____________________________________________   PROCEDURES  Procedure(s) performed: No  Procedures  Critical Care performed: No ____________________________________________   INITIAL IMPRESSION / ASSESSMENT AND PLAN / ED COURSE  Pertinent labs & imaging results that were available during my care of the patient were reviewed by me and considered in my medical decision making (see chart for details).  78 year old male with prior stroke history with no significant residual deficits presents with left arm primarily sensory symptoms but also with some weakness and decreased grip strength which have been waxing and waning since 5 PM.  When I evaluated the patient around 2 AM, he states that the last time he felt the abnormal sensation was approximately 1 hour prior.   On exam the patient is well-appearing and his vital signs are normal except for slight hypertension.  The remainder of the exam is unremarkable.  There are no neurologic deficits on exam at this time and his NIH stroke scale is 0.  CT and lab work-up obtained from triage show no significant acute findings.  I discussed the results of the work-up with the patient.  Overall I suspect most likely peripheral neuropathy or radiculopathy given that the symptoms are relatively isolated and start somewhat distally, however given his history there is also a significant risk for this being a TIA.  Since the symptoms have continued to wax and wane even while he has been in the emergency department I do not think it would be appropriate to send him home tonight even with additional imaging.  I will plan to admit for further observation, MRI in the morning, and other stroke work-up as needed.  ----------------------------------------- 3:05 AM on 12/19/2018 -----------------------------------------  I  signed the patient out to the hospitalist Dr. Sheryle Hail at approximately 3 AM.  ____________________________________________   FINAL CLINICAL IMPRESSION(S) / ED DIAGNOSES  Final diagnoses:  Numbness  TIA (transient ischemic attack)      NEW MEDICATIONS STARTED DURING THIS VISIT:  Current Discharge Medication List       Note:  This document was prepared using Dragon voice recognition software and may include unintentional dictation errors.    Dionne Bucy, MD 12/19/18 [phone]

## 2018-12-19 NOTE — Plan of Care (Signed)
  Problem: Education: Goal: Knowledge of General Education information will improve Description Including pain rating scale, medication(s)/side effects and non-pharmacologic comfort measures Outcome: Progressing   Problem: Education: Goal: Knowledge of disease or condition will improve Outcome: Progressing Goal: Knowledge of secondary prevention will improve Outcome: Progressing Goal: Knowledge of patient specific risk factors addressed and post discharge goals established will improve Outcome: Progressing   Problem: Coping: Goal: Will identify appropriate support needs Outcome: Progressing   Problem: Health Behavior/Discharge Planning: Goal: Ability to manage health-related needs will improve Outcome: Progressing   Problem: Self-Care: Goal: Ability to communicate needs accurately will improve Outcome: Progressing   Problem: Nutrition: Goal: Risk of aspiration will decrease Outcome: Progressing   Problem: Ischemic Stroke/TIA Tissue Perfusion: Goal: Complications of ischemic stroke/TIA will be minimized Outcome: Progressing   

## 2018-12-19 NOTE — Progress Notes (Signed)
At this morning for left hand pain, weakness, found to have mild acute infarct in the right postcentral gyrus, neurology feels is likely embolic, patient had ultrasound of carotids did not show acute changes, echocardiogram pending, LDL is within goal, hemoglobin A1c is less than 7, patient had 2 strokes in less than 4 months, seen by neurology and she recommends cardiology evaluation, continue Eliquis, aspirin, physical therapy, speech therapy consult.  Cardiology Dr. Lady Gary, patient has history of previous stroke, received TPA, patient is given apixaban because of excrescences that could have caused her stroke, has history of proximal atrial fibrillation, anticoagulated with  apixaban #2 history of cryptogenic stroke, proximal atrial fibrillation, patient does have LinLQ device. Consult cardiology.  Reviewed the medical record from Dr. America Brown office, discussed with neurologist Dr. Thad Ranger.Marland Kitchen

## 2018-12-19 NOTE — Care Management Note (Signed)
Case Management Note  Patient Details  Name: Harold Sandoval MRN: [id number] Date of Birth: July 23, 1941  Subjective/Objective:  Patient admitted to [facility] under observation status for TIA. RNCM consulted on patient to provide MOON letter and complete assessment. Patient is completely independent at baseline and able to complete all activities of daily living. Patient still drives. Requires no DME. PCP is Letitia Libra. [facility]btains [facility] without issue. Patient chief compliant is arm weakness, PT/OT pending. Patient agreeable to see if there are transition of care needs but reports improving strength since hospitalization.                    Action/Plan: PT/OT pending  Expected Discharge Date:                  Expected Discharge Plan:     In-House Referral:     Discharge planning Services     Post Acute Care Choice:    Choice offered to:     DME Arranged:    DME Agency:     HH Arranged:    HH Agency:     Status of Service:     If discussed at [employer] of Stay Meetings, dates discussed:    Additional Comments:  Virgel Manifold, RN 12/19/2018, 10:31 AM

## 2018-12-19 NOTE — ED Notes (Signed)
[name] friend Katha Hamming can be contacted with questions. Her numbers are [phone] and [phone].

## 2018-12-19 NOTE — ED Notes (Signed)
ED TO INPATIENT HANDOFF REPORT  Name/Age/Gender Harold Sandoval 78 y.o. male  Code Status    Code Status Orders  (From admission, onward)         Start     Ordered   12/19/18 0340  Full code  Continuous     12/19/18 0339        Code Status History    Date Active Date Inactive Code Status Order ID Comments User Context   09/06/2018 1713 09/07/2018 2114 Full Code [id number]  Enedina Finner, MD Inpatient   08/11/2018 1804 08/13/2018 1720 Full Code [id number]  Janetta Hora Inpatient   02/10/2018 1912 02/12/2018 2237 Full Code [id number]  Janetta Hora Inpatient   05/12/2016 2033 05/13/2016 1704 Full Code [id number]  Shaune Pollack, MD Inpatient      Home/SNF/Other Home  Chief Complaint Arm numb  Level of Care/Admitting Diagnosis ED Disposition    ED Disposition Condition Comment   Admit  Hospital Area: [facility] [[id number]]  Level of Care: Med-Surg [16]  Diagnosis: TIA (transient ischemic attack) [144315]  Admitting Physician: Arnaldo Natal [[id number]]  Attending Physician: Arnaldo Natal [[id number]]  PT Class (Do Not Modify): Observation [104]  PT Acc Code (Do Not Modify): Observation [10022]       Medical History Past Medical History:  Diagnosis Date  . Arthritis    "all over" (02/10/2018)  . Balance problem    when first stands.  Since stroke.  . Constipation   . 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 Degenerative joint disease (DJD) of hip   . Diabetic neuropathy (HCC)   . Diabetic neuropathy (HCC)    fingers  . 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 Glaucoma, both eyes   . High cholesterol   . History of gout   . 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 Macular degeneration, left eye   . Sinus headache    resolved  . Type II diabetes mellitus (HCC)     Allergies No Known Allergies  IV  Location/Drains/Wounds Patient Lines/Drains/Airways Status   Active Line/Drains/Airways    Name:   Placement date:   Placement time:   Site:   Days:   Peripheral IV 12/19/18 Right Forearm   12/19/18    0244    Forearm   less than 1   Incision (Closed) 08/11/18 Knee Left   08/11/18    1533     130          Labs/Imaging Results for orders placed or performed during the hospital encounter of 12/19/18 (from the past 48 hour(s))  Troponin I - ONCE - STAT     Status: None   Collection Time: 12/18/18  9:04 PM  Result Value Ref Range   Troponin I <0.03 <0.03 ng/mL    Comment: Performed at [facility], [address] [place], [place] [address]  CBC with Differential     Status: Abnormal   Collection Time: 12/18/18  9:04 PM  Result Value Ref Range   WBC 6.9 4.0 - 10.5 K/uL   RBC 5.14 4.22 - 5.81 MIL/uL   Hemoglobin 15.2 13.0 - 17.0 g/dL   HCT 58.0 99.8 - 33.8 %   MCV 90.7 80.0 - 100.0 fL   MCH 29.6 26.0 - 34.0 pg   MCHC 32.6 30.0 - 36.0 g/dL   RDW 25.0  11.5 - 15.5 %   Platelets 130 (L) 150 - 400 K/uL   nRBC 0.0 0.0 - 0.2 %   Neutrophils Relative % 50 %   Neutro Abs 3.5 1.7 - 7.7 K/uL   Lymphocytes Relative 40 %   Lymphs Abs 2.7 0.7 - 4.0 K/uL   Monocytes Relative 6 %   Monocytes Absolute 0.4 0.1 - 1.0 K/uL   Eosinophils Relative 3 %   Eosinophils Absolute 0.2 0.0 - 0.5 K/uL   Basophils Relative 1 %   Basophils Absolute 0.1 0.0 - 0.1 K/uL   Smear Review PLATELET CLUMPS NOTED ON SMEAR, UNABLE TO ESTIMATE    Immature Granulocytes 0 %   Abs Immature Granulocytes 0.01 0.00 - 0.07 K/uL    Comment: Performed at [facility], [address] [place], [place] [address]  Basic metabolic panel     Status: Abnormal   Collection Time: 12/18/18  9:04 PM  Result Value Ref Range   Sodium 142 135 - 145 mmol/L   Potassium 4.1 3.5 - 5.1 mmol/L    Comment: HEMOLYSIS AT THIS LEVEL MAY AFFECT RESULT   Chloride 104 98 - 111 mmol/L   CO2 30 22 - 32 mmol/L   Glucose, Bld 127  (H) 70 - 99 mg/dL   BUN 14 8 - 23 mg/dL   Creatinine, Ser 4.090.90 0.61 - 1.24 mg/dL   Calcium 9.9 8.9 - 81.110.3 mg/dL   GFR calc non Af Amer >60 >60 mL/min   GFR calc Af Amer >60 >60 mL/min   Anion gap 8 5 - 15    Comment: Performed at [facility], [address] [place], [place] [address]  Ethanol     Status: None   Collection Time: 12/18/18  9:04 PM  Result Value Ref Range   Alcohol, Ethyl (B) <10 <10 mg/dL    Comment: (NOTE) Lowest detectable limit for serum alcohol is 10 mg/dL. For medical purposes only. Performed at [facility], [address] [place], [place] [address]   Protime-INR     Status: None   Collection Time: 12/18/18  9:04 PM  Result Value Ref Range   Prothrombin Time 14.1 11.4 - 15.2 seconds   INR 1.10     Comment: Performed at [facility], [address] [place], [place] [address]   Dg Chest 1 View  Result Date: 12/18/2018 CLINICAL DATA:  Left arm numbness since around 1700 hours. Previous history of stroke. EXAM: CHEST  1 VIEW COMPARISON:  02/03/2018 FINDINGS: Hyperinflation suggesting emphysema. Normal heart size and pulmonary vascularity. No focal airspace disease or consolidation in the lungs. No blunting of costophrenic angles. No pneumothorax. Mediastinal contours appear intact. Loop recorder is present. Calcification of the aorta. Degenerative changes in the spine and shoulders. IMPRESSION: Emphysematous changes in the lungs. No evidence of active pulmonary disease. Electronically Signed   By: Burman NievesWilliam  Stevens M.D.   On: 12/18/2018 21:33   Ct Head Wo Contrast  Result Date: 12/18/2018 CLINICAL DATA:  Acute onset of LEFT UPPER EXTREMITY numbness that began around 5 o'clock p.m. this afternoon. Personal history of stroke. EXAM: CT HEAD WITHOUT CONTRAST TECHNIQUE: Contiguous axial images were obtained from the base of the skull through the vertex without intravenous contrast. COMPARISON:  MRI brain 09/07/2018. CT head 09/06/2018.  FINDINGS: Brain: Ventricular system normal in size and appearance for age. Remote LEFT parietal cortical stroke with encephalomalacia, unchanged. Remote lacunar stroke in the LEFT basal ganglia, unchanged. Moderate changes of small vessel disease of  the white matter diffusely, unchanged. No mass lesion. No midline shift. No acute hemorrhage or hematoma. No extra-axial fluid collections. No evidence of acute infarction. 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 No hyperdense vessel. Skull: No skull fracture or other focal osseous abnormality involving the skull. Sinuses/Orbits: Visualized paranasal sinuses, bilateral mastoid air cells and bilateral middle ear cavities well-aerated. Visualized orbits and globes normal in appearance. Other: None. IMPRESSION: 1. No acute intracranial abnormality. 2. Stable old LEFT parietal cortical stroke with encephalomalacia and old LEFT basal ganglia lacunar stroke. 3. Stable moderate chronic microvascular ischemic changes of the white matter. Electronically Signed   By: Hulan Saashomas  Lawrence M.D.   On: 12/18/2018 21:39    Pending Labs Unresulted Labs (From admission, onward)    Start     Ordered   12/26/18 0500  Creatinine, serum  (enoxaparin (LOVENOX)    CrCl >/= 30 ml/min)  Weekly,   STAT    Comments:  while on enoxaparin therapy    12/19/18 0339   12/19/18 0500  Lipid panel  Tomorrow morning,   STAT    Comments:  Fasting    12/19/18 0339   12/19/18 0340  TSH  Add-on,   AD     12/19/18 0339          Vitals/Pain Today's Vitals   12/18/18 2058 12/19/18 0126 12/19/18 0234 12/19/18 0314  BP: (!) 144/81 (!) 145/70 (!) 147/103 (!) 144/77  Pulse: 97 82 82 81  Resp: 18 18 18 18   Temp: 98.2 F (36.8 C)     SpO2: 99% 97% 96% 96%  Weight:      Height:      PainSc:        Isolation Precautions No active isolations  Medications Medications  aspirin EC tablet 81 mg (has no administration in time range)   stroke: mapping  our early stages of recovery book (has no administration in time range)  enoxaparin (LOVENOX) injection 40 mg (has no administration in time range)  acetaminophen (TYLENOL) tablet 650 mg (has no administration in time range)    Or  acetaminophen (TYLENOL) suppository 650 mg (has no administration in time range)  ondansetron (ZOFRAN) tablet 4 mg (has no administration in time range)    Or  ondansetron (ZOFRAN) injection 4 mg (has no administration in time range)  docusate sodium (COLACE) capsule 100 mg (has no administration in time range)  insulin aspart (novoLOG) injection 0-9 Units (has no administration in time range)  insulin aspart (novoLOG) injection 0-5 Units (has no administration in time range)    Mobility walks

## 2018-12-19 NOTE — Consult Note (Signed)
Referring Physician: Sheryle Hail    Chief Complaint: Left arm pain and numbness  HPI: Harold Sandoval is an 78 y.o. 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  Patient reports that he has multiple episodes of left hand pain that first started in the palm, radiated to the fingers and then to the forearm.  Numbness would follow the same distribution and then the arm would become limp.  Symptoms would resolve after a few minutes and this happened multiple times before coming to the hospital.  Initial NIHSS of 0.    Date last known well: Date: 12/18/2018 Time last known well: Time: 17:00 tPA Given: No: Resolution of symptoms  Past Medical History:  Diagnosis Date  . Arthritis    "all over" (02/10/2018)  . Balance problem    when first stands.  Since stroke.  . Constipation   . 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 Degenerative joint disease (DJD) of hip   . Diabetic neuropathy (HCC)   . Diabetic neuropathy (HCC)    fingers  . 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 Glaucoma, both eyes   . High cholesterol   . History of gout   . 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 Macular degeneration, left eye   . Sinus headache    resolved  . Type II diabetes mellitus (HCC)     Past Surgical History:  Procedure Laterality Date  . APPENDECTOMY    . BACK SURGERY    . CATARACT EXTRACTION, BILATERAL    . COLONOSCOPY WITH PROPOFOL N/A 12/29/2016   Procedure: COLONOSCOPY WITH PROPOFOL;  Surgeon: Midge Minium, MD;  Location: [facility];  Service: Endoscopy;  Laterality: N/A;  Diabetic - oral meds  . JOINT REPLACEMENT    . LAPAROSCOPIC CHOLECYSTECTOMY    . LOOP RECORDER INSERTION N/A 02/24/2017    Procedure: Loop Recorder Insertion;  Surgeon: Marcina Millard, MD;  Location: [facility];  Service: Cardiovascular;  Laterality: N/A;  . LUMBAR DISC SURGERY    . TOTAL HIP ARTHROPLASTY Left 02/10/2018   Procedure: TOTAL HIP ARTHROPLASTY ANTERIOR APPROACH;  Surgeon: Gean Birchwood, MD;  Location: [facility];  Service: Orthopedics;  Laterality: Left;  . TOTAL KNEE ARTHROPLASTY Left 08/11/2018   Procedure: LEFT TOTAL KNEE ARTHROPLASTY;  Surgeon: Gean Birchwood, MD;  Location: [facility];  Service: Orthopedics;  Laterality: Left;    Family History  Problem Relation Age of Onset  . Cancer Mother   . Cancer Father    Social History:  reports that he has never smoked. He has never used smokeless tobacco. He reports that he does not drink alcohol or use drugs.  Allergies: No Known Allergies  Medications:  I have reviewed the patient's current medications. Prior to Admission:  Medications Prior to Admission  Medication Sig Dispense Refill Last Dose  . apixaban (ELIQUIS) 5 MG TABS tablet Take 1 tablet (5 mg total) by mouth 2 (two) times daily. 60 tablet 2   . aspirin EC 81 MG tablet Take 1 tablet (81 mg total) by mouth daily. 60 tablet 0   . atorvastatin (LIPITOR) 80 MG tablet Take 80 mg by mouth every evening.    09/05/2018 at 0830  . brimonidine (ALPHAGAN) 0.2 % ophthalmic solution Place 1  drop into both eyes 2 (two) times daily.   0 09/05/2018 at 1930  . brinzolamide (AZOPT) 1 % ophthalmic suspension Place 1 drop into both eyes 2 (two) times daily.   09/05/2018 at 1930  . Cholecalciferol (VITAMIN D3) 2000 units capsule Take 2,000 Units by mouth daily.   09/05/2018 at 0800  . desonide (DESOWEN) 0.05 % lotion Apply 1 application topically 2 (two) times daily.   09/05/2018 at 1930  . fluticasone (FLONASE) 50 MCG/ACT nasal spray Place 2 sprays into both nostrils daily as needed for rhinitis.   PRN at PRN  . latanoprost (XALATAN) 0.005 % ophthalmic solution Place 1 drop into both eyes at bedtime.   0  09/05/2018 at 1930  . metFORMIN (GLUCOPHAGE) 500 MG tablet Take 500 mg by mouth 2 (two) times daily with a meal.  11 09/06/2018 at 0830  . oxyCODONE-acetaminophen (PERCOCET/ROXICET) 5-325 MG tablet Take 1 tablet by mouth every 4 (four) hours as needed for severe pain. 30 tablet 0 PRN at PRN  . senna (SENOKOT) 8.6 MG tablet Take 2 tablets by mouth daily as needed for constipation.   PRN at PRN  . tiZANidine (ZANAFLEX) 2 MG tablet Take 1 tablet (2 mg total) by mouth every 6 (six) hours as needed. (Patient taking differently: Take 2 mg by mouth 2 (two) times daily. ) 60 tablet 0 09/05/2018 at 1930   Scheduled: . apixaban  5 mg Oral BID  . aspirin EC  81 mg Oral Daily  . atorvastatin  80 mg Oral QPM  . docusate sodium  100 mg Oral BID  . insulin aspart  0-5 Units Subcutaneous QHS  . insulin aspart  0-9 Units Subcutaneous TID WC    ROS: History obtained from the patient  General ROS: negative for - chills, fatigue, fever, night sweats, weight gain or weight loss Psychological ROS: memory difficulties Ophthalmic ROS: negative for - blurry vision, double vision, eye pain or loss of vision ENT ROS: negative for - epistaxis, nasal discharge, oral lesions, sore throat, tinnitus or vertigo Allergy and Immunology ROS: negative for - hives or itchy/watery eyes Hematological and Lymphatic ROS: negative for - bleeding problems, bruising or swollen lymph nodes Endocrine ROS: negative for - galactorrhea, hair pattern changes, polydipsia/polyuria or temperature intolerance Respiratory ROS: negative for - cough, hemoptysis, shortness of breath or wheezing Cardiovascular ROS: negative for - chest pain, dyspnea on exertion, edema or irregular heartbeat Gastrointestinal ROS: negative for - abdominal pain, diarrhea, hematemesis, nausea/vomiting or stool incontinence Genito-Urinary ROS: negative for - dysuria, hematuria, incontinence or urinary frequency/urgency Musculoskeletal ROS: knee pain and hip  pain Neurological ROS: as noted in HPI Dermatological ROS: negative for rash and skin lesion changes  Physical Examination: Blood pressure 132/78, pulse 98, temperature 97.7 F (36.5 C), temperature source Oral, resp. rate 16, height 6\' 4"  (1.93 m), weight 83.9 kg, SpO2 98 %.  HEENT-  Normocephalic, no lesions, without obvious abnormality.  Normal external eye and conjunctiva.  Normal TM's bilaterally.  Normal auditory canals and external ears. Normal external nose, mucus membranes and septum.  Normal pharynx. Cardiovascular- S1, S2 normal, pulses palpable throughout   Lungs- chest clear, no wheezing, rales, normal symmetric air entry Abdomen- soft, non-tender; bowel sounds normal; no masses,  no organomegaly Extremities- no edema Lymph-no adenopathy palpable Musculoskeletal-no joint tenderness, deformity or swelling Skin-warm and dry, no hyperpigmentation, vitiligo, or suspicious lesions  Neurological Examination   Mental Status: Alert, oriented, thought content appropriate but easily confused.  Speech fluent without evidence of aphasia.  Able to follow 3 step commands but requires some mild reinforcement. Cranial Nerves: II: Discs flat bilaterally; Visual fields grossly normal, pupils equal, round, reactive to light and accommodation III,IV, VI: ptosis not present, extra-ocular motions intact bilaterally V,VII: smile symmetric, facial light touch sensation normal bilaterally VIII: hearing normal bilaterally IX,X: gag reflex present XI: bilateral shoulder shrug XII: midline tongue extension Motor: Right : Upper extremity   5/5    Left:     Upper extremity   5/5  Lower extremity   5/5     Lower extremity   5/5 Tone and bulk:normal tone throughout; no atrophy noted Sensory: Pinprick and light touch intact throughout, bilaterally Deep Tendon Reflexes: 2+ in the upper extremities and absent in the lower extremities Plantars: Right: downgoing   Left: downgoing Cerebellar: Normal  finger-to-nose and normal heel-to-shin testing bilaterally Gait: not tested due to safety concerns    Laboratory Studies:  Basic Metabolic Panel: Recent Labs  Lab 12/18/18 2103-04-30  NA 142  K 4.1  CL 104  CO2 30  GLUCOSE 127*  BUN 14  CREATININE 0.90  CALCIUM 9.9    Liver Function Tests: No results for input(s): AST, ALT, ALKPHOS, BILITOT, PROT, ALBUMIN in the last 168 hours. No results for input(s): LIPASE, AMYLASE in the last 168 hours. No results for input(s): AMMONIA in the last 168 hours.  CBC: Recent Labs  Lab 12/18/18 30-Apr-2103  WBC 6.9  NEUTROABS 3.5  HGB 15.2  HCT 46.6  MCV 90.7  PLT 130*    Cardiac Enzymes: Recent Labs  Lab 12/18/18 04-30-03  TROPONINI <0.03    BNP: Invalid input(s): POCBNP  CBG: Recent Labs  Lab 12/19/18 0849  GLUCAP 101*    Microbiology: Results for orders placed or performed during the hospital encounter of 08/04/18  Surgical pcr screen     Status: None   Collection Time: 08/04/18 11:41 AM  Result Value Ref Range Status   MRSA, PCR NEGATIVE NEGATIVE Final   Staphylococcus aureus NEGATIVE NEGATIVE Final    Comment: (NOTE) The Xpert SA Assay (FDA approved for NASAL specimens in patients 9 years of age and older), is one component of a comprehensive surveillance program. It is not intended to diagnose infection nor to guide or monitor treatment. Performed at [facility], [address] [place], [place] [address]     Coagulation Studies: Recent Labs    12/18/18 2103/04/30  LABPROT 14.1  INR 1.10    Urinalysis: No results for input(s): COLORURINE, LABSPEC, PHURINE, GLUCOSEU, HGBUR, BILIRUBINUR, KETONESUR, PROTEINUR, UROBILINOGEN, NITRITE, LEUKOCYTESUR in the last 168 hours.  Invalid input(s): APPERANCEUR  Lipid Panel:    Component Value Date/Time   CHOL 99 09/06/2018 1925   TRIG 52 09/06/2018 1925   HDL 31 (L) 09/06/2018 1925   CHOLHDL 3.2 09/06/2018 1925   VLDL 10 09/06/2018 1925   LDLCALC 58  09/06/2018 1925    HgbA1C:  Lab Results  Component Value Date   HGBA1C 6.4 (H) 09/06/2018    Urine Drug Screen:  No results found for: LABOPIA, COCAINSCRNUR, LABBENZ, AMPHETMU, THCU, LABBARB  Alcohol Level:  Recent Labs  Lab 12/18/18 Apr 30, 2103  ETH <10    Other results: EKG: sinus rhythm at 91 bpm.  Imaging: Dg Chest 1 View  Result Date: 12/18/2018 CLINICAL DATA:  Left arm numbness since around 1700 hours. Previous history of stroke. EXAM: CHEST  1 VIEW COMPARISON:  02/03/2018 FINDINGS: Hyperinflation suggesting emphysema. Normal heart size and pulmonary vascularity. No focal airspace disease or consolidation  in the lungs. No blunting of costophrenic angles. No pneumothorax. Mediastinal contours appear intact. Loop recorder is present. Calcification of the aorta. Degenerative changes in the spine and shoulders. IMPRESSION: Emphysematous changes in the lungs. No evidence of active pulmonary disease. Electronically Signed   By: Burman NievesWilliam  Stevens M.D.   On: 12/18/2018 21:33   Ct Head Wo Contrast  Result Date: 12/18/2018 CLINICAL DATA:  Acute onset of LEFT UPPER EXTREMITY numbness that began around 5 o'clock p.m. this afternoon. Personal history of stroke. EXAM: CT HEAD WITHOUT CONTRAST TECHNIQUE: Contiguous axial images were obtained from the base of the skull through the vertex without intravenous contrast. COMPARISON:  MRI brain 09/07/2018. CT head 09/06/2018. FINDINGS: Brain: Ventricular system normal in size and appearance for age. Remote LEFT parietal cortical stroke with encephalomalacia, unchanged. Remote lacunar stroke in the LEFT basal ganglia, unchanged. Moderate changes of small vessel disease of the white matter diffusely, unchanged. No mass lesion. No midline shift. No acute hemorrhage or hematoma. No extra-axial fluid collections. No evidence of acute infarction. 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 No hyperdense vessel. Skull: No skull  fracture or other focal osseous abnormality involving the skull. Sinuses/Orbits: Visualized paranasal sinuses, bilateral mastoid air cells and bilateral middle ear cavities well-aerated. Visualized orbits and globes normal in appearance. Other: None. IMPRESSION: 1. No acute intracranial abnormality. 2. Stable old LEFT parietal cortical stroke with encephalomalacia and old LEFT basal ganglia lacunar stroke. 3. Stable moderate chronic microvascular ischemic changes of the white matter. Electronically Signed   By: Hulan Saashomas  Lawrence M.D.   On: 12/18/2018 21:39   Mr Brain Wo Contrast  Result Date: 12/19/2018 CLINICAL DATA:  Intermittent left upper extremity numbness beginning yesterday at 5:00 p.m. EXAM: MRI HEAD WITHOUT CONTRAST MRA HEAD WITHOUT CONTRAST TECHNIQUE: Multiplanar, multiecho pulse sequences of the brain and surrounding structures were obtained without intravenous contrast. Angiographic images of the head were obtained using MRA technique without contrast. COMPARISON:  CT head without contrast 12/18/2018. FINDINGS: MRI HEAD FINDINGS Brain: A punctate focus of restricted diffusion is present in the postcentral gyrus on image 42 of series 6. No other acute or subacute infarct is present. A remote left parietal infarct is present. Extensive confluent periventricular white matter changes are noted bilaterally. Remote lacunar infarct is present in the left caudate head. Remote lacunar infarct is again noted in the posterior right lentiform nucleus. A remote lacunar infarct is present in the left thalamus. Brainstem and cerebellum are within normal limits. The internal auditory canals are within normal limits. Vascular: Flow is present in the major intracranial arteries. Skull and upper cervical spine: The craniocervical junction is normal. Upper cervical spine is within normal limits. Marrow signal is unremarkable. Sinuses/Orbits: A posterior polyp or mucous retention cyst is again noted in the left maxillary  sinus. Mild mucosal thickening is present in the anterior ethmoid air cells. There is some fluid in the inferior mastoid air cells bilaterally. No obstructing nasopharyngeal lesion is present. Other: MRA HEAD FINDINGS Mild atherosclerotic changes are present within the cavernous internal carotid arteries bilaterally without a significant stenosis. The A1 and M1 segments are within normal limits. Anterior communicating artery is patent. MCA bifurcations are intact. There is mild irregularity of distal MCA branch vessels bilaterally without a significant proximal stenosis or occlusion. ACA branch vessels are within normal limits. Vertebrobasilar junction is normal. PICA origins are not imaged. Prominent AICA vessels are normal bilaterally. The basilar artery is normal. Both posterior cerebral arteries originate from basilar tip. PCA  branch vessels are within normal limits. IMPRESSION: 1. Punctate nonhemorrhagic acute/subacute infarct involving the right postcentral gyrus corresponds with the patient's left upper extremity numbness. 2. No other acute intracranial abnormality. 3. Remote left parietal lobe infarct and encephalomalacia. 4. Mild atrophy with advanced confluent periventricular white matter disease. This likely reflects the sequela of chronic microvascular ischemia. 5. Stable remote lacunar infarcts of the basal ganglia bilaterally. 6. Normal variant MRA circle-of-Willis without significant proximal stenosis, aneurysm, or branch vessel occlusion. 7. Mild atherosclerotic changes within the cavernous internal carotid arteries bilaterally without a significant stenosis. Electronically Signed   By: Marin Roberts M.D.   On: 12/19/2018 08:32   US Carotid Bilateral (at [facility] And [facility] Only)  Result Date: 12/19/2018 CLINICAL DATA:  78 year old male with acute punctate right cortical infarct. EXAM: BILATERAL CAROTID DUPLEX ULTRASOUND TECHNIQUE: Wallace Cullens scale imaging, color Doppler and duplex ultrasound were  performed of bilateral carotid and vertebral arteries in the neck. COMPARISON:  Relatively recent prior carotid duplex ultrasound 09/07/2018 FINDINGS: Criteria: Quantification of carotid stenosis is based on velocity parameters that correlate the residual internal carotid diameter with NASCET-based stenosis levels, using the diameter of the distal internal carotid lumen as the denominator for stenosis measurement. The following velocity measurements were obtained: RIGHT ICA: 79/28 cm/sec CCA: 94/18 cm/sec SYSTOLIC ICA/CCA RATIO:  0.8 ECA:  77 cm/sec LEFT ICA: 82/28 cm/sec CCA: 75/21 cm/sec SYSTOLIC ICA/CCA RATIO:  1.1 ECA:  62 cm/sec RIGHT CAROTID ARTERY: Unchanged mild heterogeneous atherosclerotic plaque in the proximal internal carotid artery. By peak systolic velocity criteria, the estimated stenosis remains less than 50%. RIGHT VERTEBRAL ARTERY:  Patent with normal antegrade flow. LEFT CAROTID ARTERY: Unchanged mild heterogeneous atherosclerotic plaque in the proximal internal carotid artery. By peak systolic velocity criteria, the estimated stenosis remains less than 50%. LEFT VERTEBRAL ARTERY:  Patent with normal antegrade flow. IMPRESSION: 1. No significant change or interval progression of bilateral mild carotid artery disease compared to 09/07/2018. 2. Mild (1-49%) stenosis proximal right internal carotid artery secondary to mild heterogeneous atherosclerotic plaque. 3. Mild (1-49%) stenosis proximal left internal carotid artery secondary to mild heterogeneous atherosclerotic plaque. 4. Vertebral arteries are patent with normal antegrade flow. Electronically Signed   By: Malachy Moan M.D.   On: 12/19/2018 08:41   Mr Maxine Glenn Head Wo Contrast  Result Date: 12/19/2018 CLINICAL DATA:  Intermittent left upper extremity numbness beginning yesterday at 5:00 p.m. EXAM: MRI HEAD WITHOUT CONTRAST MRA HEAD WITHOUT CONTRAST TECHNIQUE: Multiplanar, multiecho pulse sequences of the brain and surrounding structures  were obtained without intravenous contrast. Angiographic images of the head were obtained using MRA technique without contrast. COMPARISON:  CT head without contrast 12/18/2018. FINDINGS: MRI HEAD FINDINGS Brain: A punctate focus of restricted diffusion is present in the postcentral gyrus on image 42 of series 6. No other acute or subacute infarct is present. A remote left parietal infarct is present. Extensive confluent periventricular white matter changes are noted bilaterally. Remote lacunar infarct is present in the left caudate head. Remote lacunar infarct is again noted in the posterior right lentiform nucleus. A remote lacunar infarct is present in the left thalamus. Brainstem and cerebellum are within normal limits. The internal auditory canals are within normal limits. Vascular: Flow is present in the major intracranial arteries. Skull and upper cervical spine: The craniocervical junction is normal. Upper cervical spine is within normal limits. Marrow signal is unremarkable. Sinuses/Orbits: A posterior polyp or mucous retention cyst is again noted in the left maxillary sinus. Mild mucosal thickening is present  in the anterior ethmoid air cells. There is some fluid in the inferior mastoid air cells bilaterally. No obstructing nasopharyngeal lesion is present. Other: MRA HEAD FINDINGS Mild atherosclerotic changes are present within the cavernous internal carotid arteries bilaterally without a significant stenosis. The A1 and M1 segments are within normal limits. Anterior communicating artery is patent. MCA bifurcations are intact. There is mild irregularity of distal MCA branch vessels bilaterally without a significant proximal stenosis or occlusion. ACA branch vessels are within normal limits. Vertebrobasilar junction is normal. PICA origins are not imaged. Prominent AICA vessels are normal bilaterally. The basilar artery is normal. Both posterior cerebral arteries originate from basilar tip. PCA branch  vessels are within normal limits. IMPRESSION: 1. Punctate nonhemorrhagic acute/subacute infarct involving the right postcentral gyrus corresponds with the patient's left upper extremity numbness. 2. No other acute intracranial abnormality. 3. Remote left parietal lobe infarct and encephalomalacia. 4. Mild atrophy with advanced confluent periventricular white matter disease. This likely reflects the sequela of chronic microvascular ischemia. 5. Stable remote lacunar infarcts of the basal ganglia bilaterally. 6. Normal variant MRA circle-of-Willis without significant proximal stenosis, aneurysm, or branch vessel occlusion. 7. Mild atherosclerotic changes within the cavernous internal carotid arteries bilaterally without a significant stenosis. Electronically Signed   By: Marin Roberts M.D.   On: 12/19/2018 08:32    Assessment: 78 y.o. male with a history of stroke on Eliquis, ASA and statin at home who presents wit intermittent complaints of left upper extremity numbness, weakness and pain.  MRI of the brain reviewed and shows a small, acute infarct in the right postcentral gyrus.  Etiology likely embolic.  Carotid dopplers show no evidence of hemodynamically significant stenosis.  Echocardiogram pending. LDL pending.  Stroke Risk Factors - diabetes mellitus, hyperlipidemia and hypertension  Plan: 1. HgbA1c.  Fasting lipid panel pending.  Target A1c,7.0 and target LDL<70.   2. Frequent neuro checks 3. Echocardiogram pending.  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 Prophylactic therapy-Continue Eliquis and ASA 5. NPO until RN stroke swallow screen 6. Telemetry monitoring 7. Follow up with neurology on an outpatient basis.     Case discussed with Dr. Cherylann Banas, MD Neurology [phone] 12/19/2018, 10:42 AM

## 2018-12-19 NOTE — Consult Note (Signed)
Cardiology Consultation Note    Patient ID: Harold Sandoval, MRN: [id number], DOB/AGE: [birth year] 78 y.o. Admit date: 12/19/2018   Date of Consult: 12/19/2018 Primary Physician: Gracelyn Nurse, MD Primary Cardiologist: Dr. Lady Gary  Chief Complaint: left hand and arm numbness Reason for Consultation: cva Requesting MD: Dr. Luberta Mutter  HPI: Harold Sandoval is a 78 y.o. 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 He was evaluated with a transthoracic and transesophageal echo. 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 He has been treated with apixaban due to the fact that the excrescences may have contributed to his stroke. He is fairly active. He wore a Holter monitor and a 30 day monitor with no evidence of sustained atrial arrhythmias. He was referred to [facility] electrophysiology for consideration for ILR implantation however due to insurance issues he was not able to have this completed there. He denies any history of atrial fibrillation but concern over cryptogenic stroke was raised. His chads 2 vasc score would be 2 for stroke, 2 age greater than 19. He has a Medtronic Linq device in place. So far there is been no atrial fibrillation.  He was readmitted after left arm neurologic changes.  Brain MRI revealed punctate acute/subacute infarcts in the right postcentral gyrus consistent with his left upper extremity numbness.  No other acute findings.  Patient has been on Eliquis.  He states he has been compliant with this.  He appears stable at present.  Has ILR in place.  He has been anticoagulated with Eliquis and aspirin.   Past Medical History:  Diagnosis Date  . Arthritis    "all over" (02/10/2018)  . Balance problem    when first stands.  Since stroke.  . Constipation   . 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 Degenerative joint disease (DJD) of hip   . Diabetic  neuropathy (HCC)   . Diabetic neuropathy (HCC)    fingers  . 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 Glaucoma, both eyes   . High cholesterol   . History of gout   . 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 Macular degeneration, left eye   . Sinus headache    resolved  . Type II diabetes mellitus (HCC)       Surgical History:  Past Surgical History:  Procedure Laterality Date  . APPENDECTOMY    . BACK SURGERY    . CATARACT EXTRACTION, BILATERAL    . COLONOSCOPY WITH PROPOFOL N/A 12/29/2016   Procedure: COLONOSCOPY WITH PROPOFOL;  Surgeon: Midge Minium, MD;  Location: [facility];  Service: Endoscopy;  Laterality: N/A;  Diabetic - oral meds  . JOINT REPLACEMENT    . LAPAROSCOPIC CHOLECYSTECTOMY    . LOOP RECORDER INSERTION N/A 02/24/2017   Procedure: Loop Recorder Insertion;  Surgeon: Marcina Millard, MD;  Location: [facility] INVASIVE CV LAB;  Service: Cardiovascular;  Laterality: N/A;  . LUMBAR DISC SURGERY    . TOTAL HIP ARTHROPLASTY Left 02/10/2018   Procedure: TOTAL HIP ARTHROPLASTY ANTERIOR APPROACH;  Surgeon: Gean Birchwood, MD;  Location: [facility] OR;  Service: Orthopedics;  Laterality: Left;  . TOTAL KNEE ARTHROPLASTY Left 08/11/2018   Procedure: LEFT TOTAL KNEE ARTHROPLASTY;  Surgeon: Gean Birchwood, MD;  Location: [facility] ORS;  Service: Orthopedics;  Laterality: Left;     Home Meds: Prior to Admission medications  Medication Sig Start Date End Date Taking? Authorizing Provider  apixaban (ELIQUIS) 5 MG TABS tablet Take 1 tablet (5 mg total) by mouth 2 (two) times daily. 09/07/18   Enid BaasKalisetti, Radhika, MD  aspirin EC 81 MG tablet Take 1 tablet (81 mg total) by mouth daily. 09/07/18   Enid BaasKalisetti, Radhika, MD  atorvastatin (LIPITOR) 80 MG tablet Take 80 mg by mouth every evening.     [provider]  brimonidine (ALPHAGAN) 0.2 % ophthalmic solution Place 1 drop into both eyes 2 (two) times daily.     [provider]   brinzolamide (AZOPT) 1 % ophthalmic suspension Place 1 drop into both eyes 2 (two) times daily.    [provider]  Cholecalciferol (VITAMIN D3) 2000 units capsule Take 2,000 Units by mouth daily.    [provider]  desonide (DESOWEN) 0.05 % lotion Apply 1 application topically 2 (two) times daily.    [provider]  fluticasone (FLONASE) 50 MCG/ACT nasal spray Place 2 sprays into both nostrils daily as needed for rhinitis.    [provider]  latanoprost (XALATAN) 0.005 % ophthalmic solution Place 1 drop into both eyes at bedtime.     [provider]  metFORMIN (GLUCOPHAGE) 500 MG tablet Take 500 mg by mouth 2 (two) times daily with a meal. 08/03/18   [provider]  oxyCODONE-acetaminophen (PERCOCET/ROXICET) 5-325 MG tablet Take 1 tablet by mouth every 4 (four) hours as needed for severe pain. 08/11/18   Allena KatzPhillips, Eric K, PA-C  senna (SENOKOT) 8.6 MG tablet Take 2 tablets by mouth daily as needed for constipation.    [provider]  tiZANidine (ZANAFLEX) 2 MG tablet Take 1 tablet (2 mg total) by mouth every 6 (six) hours as needed. Patient taking differently: Take 2 mg by mouth 2 (two) times daily.  08/11/18   Allena KatzPhillips, Eric K, PA-C    Inpatient Medications:  . apixaban  5 mg Oral BID  . aspirin EC  81 mg Oral Daily  . atorvastatin  80 mg Oral QPM  . docusate sodium  100 mg Oral BID  . insulin aspart  0-5 Units Subcutaneous QHS  . insulin aspart  0-9 Units Subcutaneous TID WC     Allergies: No Known Allergies  Social History   Socioeconomic History  . Marital status: Widowed    Spouse name: Not on file  . Number of children: Not on file  . Years of education: Not on file  . Highest education level: Not on file  Occupational History  . Not on file  Social Needs  . Financial resource strain: Not on file  . Food insecurity:    Worry: Not on file    Inability: Not on file  . Transportation needs:    Medical: Not on  file    Non-medical: Not on file  Tobacco Use  . Smoking status: Never Smoker  . Smokeless tobacco: Never Used  Substance and Sexual Activity  . Alcohol use: No    Alcohol/week: 0.0 standard drinks  . Drug use: No  . Sexual activity: Yes    Partners: Female  Lifestyle  . Physical activity:    Days per week: Not on file    Minutes per session: Not on file  . Stress: Not on file  Relationships  . Social connections:    Talks on phone: Not on file    Gets together: Not on file    Attends religious service: Not on file    Active  member of club or organization: Not on file    Attends meetings of clubs or organizations: Not on file    Relationship status: Not on file  . Intimate partner violence:    Fear of current or ex partner: Not on file    Emotionally abused: Not on file    Physically abused: Not on file    Forced sexual activity: Not on file  Other Topics Concern  . Not on file  Social History Narrative  . Not on file     Family History  Problem Relation Age of Onset  . Cancer Mother   . Cancer Father      Review of Systems: A 12-system review of systems was performed and is negative except as noted in the HPI.  Labs: Recent Labs    12/18/18 2104  TROPONINI <0.03   Lab Results  Component Value Date   WBC 6.9 12/18/2018   HGB 15.2 12/18/2018   HCT 46.6 12/18/2018   MCV 90.7 12/18/2018   PLT 130 (L) 12/18/2018    Recent Labs  Lab 12/18/18 2104  NA 142  K 4.1  CL 104  CO2 30  BUN 14  CREATININE 0.90  CALCIUM 9.9  GLUCOSE 127*   Lab Results  Component Value Date   CHOL 103 12/18/2018   HDL 47 12/18/2018   LDLCALC 47 12/18/2018   TRIG 47 12/18/2018   No results found for: DDIMER  Radiology/Studies:  Dg Chest 1 View  Result Date: 12/18/2018 CLINICAL DATA:  Left arm numbness since around 1700 hours. Previous history of stroke. EXAM: CHEST  1 VIEW COMPARISON:  02/03/2018 FINDINGS: Hyperinflation suggesting emphysema. Normal heart size and  pulmonary vascularity. No focal airspace disease or consolidation in the lungs. No blunting of costophrenic angles. No pneumothorax. Mediastinal contours appear intact. Loop recorder is present. Calcification of the aorta. Degenerative changes in the spine and shoulders. IMPRESSION: Emphysematous changes in the lungs. No evidence of active pulmonary disease. Electronically Signed   By: Burman Nieves M.D.   On: 12/18/2018 21:33   Ct Head Wo Contrast  Result Date: 12/18/2018 CLINICAL DATA:  Acute onset of LEFT UPPER EXTREMITY numbness that began around 5 o'clock p.m. this afternoon. Personal history of stroke. EXAM: CT HEAD WITHOUT CONTRAST TECHNIQUE: Contiguous axial images were obtained from the base of the skull through the vertex without intravenous contrast. COMPARISON:  MRI brain 09/07/2018. CT head 09/06/2018. FINDINGS: Brain: Ventricular system normal in size and appearance for age. Remote LEFT parietal cortical stroke with encephalomalacia, unchanged. Remote lacunar stroke in the LEFT basal ganglia, unchanged. Moderate changes of small vessel disease of the white matter diffusely, unchanged. No mass lesion. No midline shift. No acute hemorrhage or hematoma. No extra-axial fluid collections. No evidence of acute infarction. 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 No hyperdense vessel. Skull: No skull fracture or other focal osseous abnormality involving the skull. Sinuses/Orbits: Visualized paranasal sinuses, bilateral mastoid air cells and bilateral middle ear cavities well-aerated. Visualized orbits and globes normal in appearance. Other: None. IMPRESSION: 1. No acute intracranial abnormality. 2. Stable old LEFT parietal cortical stroke with encephalomalacia and old LEFT basal ganglia lacunar stroke. 3. Stable moderate chronic microvascular ischemic changes of the white matter. Electronically Signed   By: Hulan Saas M.D.   On: 12/18/2018 21:39   Mr Brain  Wo Contrast  Result Date: 12/19/2018 CLINICAL DATA:  Intermittent left upper extremity numbness beginning yesterday at 5:00 p.m. EXAM: MRI HEAD WITHOUT CONTRAST MRA HEAD  WITHOUT CONTRAST TECHNIQUE: Multiplanar, multiecho pulse sequences of the brain and surrounding structures were obtained without intravenous contrast. Angiographic images of the head were obtained using MRA technique without contrast. COMPARISON:  CT head without contrast 12/18/2018. FINDINGS: MRI HEAD FINDINGS Brain: A punctate focus of restricted diffusion is present in the postcentral gyrus on image 42 of series 6. No other acute or subacute infarct is present. A remote left parietal infarct is present. Extensive confluent periventricular white matter changes are noted bilaterally. Remote lacunar infarct is present in the left caudate head. Remote lacunar infarct is again noted in the posterior right lentiform nucleus. A remote lacunar infarct is present in the left thalamus. Brainstem and cerebellum are within normal limits. The internal auditory canals are within normal limits. Vascular: Flow is present in the major intracranial arteries. Skull and upper cervical spine: The craniocervical junction is normal. Upper cervical spine is within normal limits. Marrow signal is unremarkable. Sinuses/Orbits: A posterior polyp or mucous retention cyst is again noted in the left maxillary sinus. Mild mucosal thickening is present in the anterior ethmoid air cells. There is some fluid in the inferior mastoid air cells bilaterally. No obstructing nasopharyngeal lesion is present. Other: MRA HEAD FINDINGS Mild atherosclerotic changes are present within the cavernous internal carotid arteries bilaterally without a significant stenosis. The A1 and M1 segments are within normal limits. Anterior communicating artery is patent. MCA bifurcations are intact. There is mild irregularity of distal MCA branch vessels bilaterally without a significant proximal  stenosis or occlusion. ACA branch vessels are within normal limits. Vertebrobasilar junction is normal. PICA origins are not imaged. Prominent AICA vessels are normal bilaterally. The basilar artery is normal. Both posterior cerebral arteries originate from basilar tip. PCA branch vessels are within normal limits. IMPRESSION: 1. Punctate nonhemorrhagic acute/subacute infarct involving the right postcentral gyrus corresponds with the patient's left upper extremity numbness. 2. No other acute intracranial abnormality. 3. Remote left parietal lobe infarct and encephalomalacia. 4. Mild atrophy with advanced confluent periventricular white matter disease. This likely reflects the sequela of chronic microvascular ischemia. 5. Stable remote lacunar infarcts of the basal ganglia bilaterally. 6. Normal variant MRA circle-of-Willis without significant proximal stenosis, aneurysm, or branch vessel occlusion. 7. Mild atherosclerotic changes within the cavernous internal carotid arteries bilaterally without a significant stenosis. Electronically Signed   By: Marin Roberts M.D.   On: 12/19/2018 08:32   US Carotid Bilateral (at [facility] And [facility] Only)  Result Date: 12/19/2018 CLINICAL DATA:  78 year old male with acute punctate right cortical infarct. EXAM: BILATERAL CAROTID DUPLEX ULTRASOUND TECHNIQUE: Wallace Cullens scale imaging, color Doppler and duplex ultrasound were performed of bilateral carotid and vertebral arteries in the neck. COMPARISON:  Relatively recent prior carotid duplex ultrasound 09/07/2018 FINDINGS: Criteria: Quantification of carotid stenosis is based on velocity parameters that correlate the residual internal carotid diameter with NASCET-based stenosis levels, using the diameter of the distal internal carotid lumen as the denominator for stenosis measurement. The following velocity measurements were obtained: RIGHT ICA: 79/28 cm/sec CCA: 94/18 cm/sec SYSTOLIC ICA/CCA RATIO:  0.8 ECA:  77 cm/sec LEFT ICA: 82/28  cm/sec CCA: 75/21 cm/sec SYSTOLIC ICA/CCA RATIO:  1.1 ECA:  62 cm/sec RIGHT CAROTID ARTERY: Unchanged mild heterogeneous atherosclerotic plaque in the proximal internal carotid artery. By peak systolic velocity criteria, the estimated stenosis remains less than 50%. RIGHT VERTEBRAL ARTERY:  Patent with normal antegrade flow. LEFT CAROTID ARTERY: Unchanged mild heterogeneous atherosclerotic plaque in the proximal internal carotid artery. By peak systolic velocity criteria, the estimated stenosis  remains less than 50%. LEFT VERTEBRAL ARTERY:  Patent with normal antegrade flow. IMPRESSION: 1. No significant change or interval progression of bilateral mild carotid artery disease compared to 09/07/2018. 2. Mild (1-49%) stenosis proximal right internal carotid artery secondary to mild heterogeneous atherosclerotic plaque. 3. Mild (1-49%) stenosis proximal left internal carotid artery secondary to mild heterogeneous atherosclerotic plaque. 4. Vertebral arteries are patent with normal antegrade flow. Electronically Signed   By: Malachy Moan M.D.   On: 12/19/2018 08:41   Mr Maxine Glenn Head Wo Contrast  Result Date: 12/19/2018 CLINICAL DATA:  Intermittent left upper extremity numbness beginning yesterday at 5:00 p.m. EXAM: MRI HEAD WITHOUT CONTRAST MRA HEAD WITHOUT CONTRAST TECHNIQUE: Multiplanar, multiecho pulse sequences of the brain and surrounding structures were obtained without intravenous contrast. Angiographic images of the head were obtained using MRA technique without contrast. COMPARISON:  CT head without contrast 12/18/2018. FINDINGS: MRI HEAD FINDINGS Brain: A punctate focus of restricted diffusion is present in the postcentral gyrus on image 42 of series 6. No other acute or subacute infarct is present. A remote left parietal infarct is present. Extensive confluent periventricular white matter changes are noted bilaterally. Remote lacunar infarct is present in the left caudate head. Remote lacunar infarct is  again noted in the posterior right lentiform nucleus. A remote lacunar infarct is present in the left thalamus. Brainstem and cerebellum are within normal limits. The internal auditory canals are within normal limits. Vascular: Flow is present in the major intracranial arteries. Skull and upper cervical spine: The craniocervical junction is normal. Upper cervical spine is within normal limits. Marrow signal is unremarkable. Sinuses/Orbits: A posterior polyp or mucous retention cyst is again noted in the left maxillary sinus. Mild mucosal thickening is present in the anterior ethmoid air cells. There is some fluid in the inferior mastoid air cells bilaterally. No obstructing nasopharyngeal lesion is present. Other: MRA HEAD FINDINGS Mild atherosclerotic changes are present within the cavernous internal carotid arteries bilaterally without a significant stenosis. The A1 and M1 segments are within normal limits. Anterior communicating artery is patent. MCA bifurcations are intact. There is mild irregularity of distal MCA branch vessels bilaterally without a significant proximal stenosis or occlusion. ACA branch vessels are within normal limits. Vertebrobasilar junction is normal. PICA origins are not imaged. Prominent AICA vessels are normal bilaterally. The basilar artery is normal. Both posterior cerebral arteries originate from basilar tip. PCA branch vessels are within normal limits. IMPRESSION: 1. Punctate nonhemorrhagic acute/subacute infarct involving the right postcentral gyrus corresponds with the patient's left upper extremity numbness. 2. No other acute intracranial abnormality. 3. Remote left parietal lobe infarct and encephalomalacia. 4. Mild atrophy with advanced confluent periventricular white matter disease. This likely reflects the sequela of chronic microvascular ischemia. 5. Stable remote lacunar infarcts of the basal ganglia bilaterally. 6. Normal variant MRA circle-of-Willis without significant  proximal stenosis, aneurysm, or branch vessel occlusion. 7. Mild atherosclerotic changes within the cavernous internal carotid arteries bilaterally without a significant stenosis. Electronically Signed   By: Marin Roberts M.D.   On: 12/19/2018 08:32    Wt Readings from Last 3 Encounters:  12/18/18 83.9 kg  09/06/18 88.5 kg  08/11/18 89 kg    EKG: Sinus rhythm.  Physical Exam:  Blood pressure (!) 140/99, pulse 79, temperature 97.9 F (36.6 C), temperature source Tympanic, resp. rate 16, height 6\' 4"  (1.93 m), weight 83.9 kg, SpO2 97 %. Body mass index is 22.52 kg/m. General: Well developed, well nourished, in no acute distress. Head: Normocephalic, atraumatic,  sclera non-icteric, no xanthomas, nares are without discharge.  Neck: Negative for carotid bruits. JVD not elevated. Lungs: Clear bilaterally to auscultation without wheezes, rales, or rhonchi. Breathing is unlabored. Heart: RRR with S1 S2. No murmurs, rubs, or gallops appreciated. Abdomen: Soft, non-tender, non-distended with normoactive bowel sounds. No hepatomegaly. No rebound/guarding. No obvious abdominal masses. Msk:  Strength and tone appear normal for age. Extremities: No clubbing or cyanosis. No edema.  Distal pedal pulses are 2+ and equal bilaterally. Neuro: Alert and oriented X 3. No facial asymmetry. No focal deficit. Moves all extremities spontaneously. Psych:  Responds to questions appropriately with a normal affect.     Assessment and Plan  Patient with history of recurrent TIAs.  Is on Eliquis and aspirin.  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  He has been treated after evaluation both at [facility] and at [facility].  Would continue with Eliquis for now.  Data does not support any further treatment other than anticoagulation as you are doing.  In one prospective review of healthy volunteers undergoing TEE, there were similar rates of cardioembolic disease between groups with LEs and those without.  Additionally, they observed that aspirin and warfarin use did not alter prevalence or cardioembolic risk of LEs. In another study, LE were seen in up to 39% of elderly patients undergoing TEE for suspected cardiogenic embolic stroke. While they are typically small (1x46mm) they have the potential of embolization, with case reports attributing larger LEs to stroke or MI. In the absence of clear evidence that they cause cardioembolic disease, Lambl's excrescences as an isolated, incidental finding do not warrant prophylactic antithrombic therapy Signed, Dalia Heading MD 12/19/2018, 12:13 PM Pager: [phone]

## 2018-12-19 NOTE — Evaluation (Signed)
Physical Therapy Evaluation Patient Details Name: Harold Sandoval MRN: [id number] DOB: 12/24/1940 Today's Date: 12/19/2018   History of Present Illness  presented to ER secondary to L UE pain, numbness; admitted for TIA/CVA work up.  MRI signficant for acute/subacute R post-central gyrus infarct; remote L parietal infarct.  Patient reporting symptoms resolved at this time.  Clinical Impression  Upon evaluation, patient alert and oriented; follows commands and demonstrates good insight/safety awareness.  Bilat UE/LE strength and ROM grossly symmetrical (at least 4/5); no focal weakness, sensory or coordination deficits appreciated.  Able to complete bed mobility, sit/stand and basic transfers without assist device, mod indep; gait (360') without assist device, sup/mod indep.  Demonstrates  reciprocal stepping pattern with good cadence/gait speed (10' walk time, 7-8 seconds), good step height/length, good trunk rotation and arm swing. Mildly antalgic to R LE (due to recent R hip/knee surgeries); patient reports this is baseline for him.  Completes dynamic gait component (head turns, start/stop and speed modulation) without difficulty, LOB or safety oncern. Appears to be at baseline level of functional ability without need for acute PT identified at this time.  Will complete initial order; please re-consult should needs change.     Follow Up Recommendations No PT follow up    Equipment Recommendations       Recommendations for Other Services       Precautions / Restrictions Precautions Precautions: Fall Restrictions Weight Bearing Restrictions: No      Mobility  Bed Mobility Overal bed mobility: Independent                Transfers Overall transfer level: Modified independent               General transfer comment: sit/stand, functional transfers without assist device, mod indep  Ambulation/Gait Ambulation/Gait assistance: Supervision Gait Distance (Feet): 360  Feet Assistive device: None   Gait velocity: 10' walk time, 7-8 seconds   General Gait Details: reciprocal stepping pattern with good step height/length, good trunk rotation and arm swing. Mildly antalgic to R LE (due to recent R hip/knee surgeries); patient reports this is baseline for him.  Completes dynamic gait component (head turns, start/stop and speed modulation) without difficulty, LOB or safety oncern.  Stairs            Wheelchair Mobility    Modified Rankin (Stroke Patients Only)       Balance Overall balance assessment: Needs assistance Sitting-balance support: No upper extremity supported;Feet supported Sitting balance-Leahy Scale: Good     Standing balance support: No upper extremity supported Standing balance-Leahy Scale: Good                               Pertinent Vitals/Pain Pain Assessment: No/denies pain    Home Living Family/patient expects to be discharged to:: Private residence Living Arrangements: Alone Available Help at Discharge: Family;Available PRN/intermittently Type of Home: House Home Access: Stairs to enter Entrance Stairs-Rails: Doctor, general practice of Steps: 3 Home Layout: One level Home Equipment: Walker - 2 wheels;Cane - single point;Bedside commode;Adaptive equipment      Prior Function Level of Independence: Independent         Comments: Indep for ADLs, household and community mobilization without assist device; denies fall history.  Goes to the [facility] 5x/week for therex (completed therapy for recent R knee TKR 08/2018)     Hand Dominance   Dominant Hand: Right    Extremity/Trunk Assessment   Upper  Extremity Assessment Upper Extremity Assessment: Overall WFL for tasks assessed(grossly symmetrical and WFL; no focal weakness, sensory or coordination deficits noted)    Lower Extremity Assessment Lower Extremity Assessment: Overall WFL for tasks assessed(grossly symmetrical and WFL (4/5); no  focal weakness, sensory or coordination deficits noted)       Communication   Communication: No difficulties  Cognition Arousal/Alertness: Awake/alert Behavior During Therapy: WFL for tasks assessed/performed Overall Cognitive Status: Within Functional Limits for tasks assessed                                        General Comments      Exercises     Assessment/Plan    PT Assessment Patent does not need any further PT services  PT Problem List         PT Treatment Interventions      PT Goals (Current goals can be found in the Care Plan section)  Acute Rehab PT Goals Patient Stated Goal: to return home PT Goal Formulation: All assessment and education complete, DC therapy Time For Goal Achievement: 12/19/18 Potential to Achieve Goals: Good    Frequency     Barriers to discharge        Co-evaluation               AM-PAC PT "6 Clicks" Mobility  Outcome Measure Help needed turning from your back to your side while in a flat bed without using bedrails?: None Help needed moving from lying on your back to sitting on the side of a flat bed without using bedrails?: None Help needed moving to and from a bed to a chair (including a wheelchair)?: None Help needed standing up from a chair using your arms (e.g., wheelchair or bedside chair)?: None Help needed to walk in hospital room?: None Help needed climbing 3-5 steps with a railing? : None 6 Click Score: 24    End of Session Equipment Utilized During Treatment: Gait belt Activity Tolerance: Patient tolerated treatment well Patient left: in bed;with call bell/phone within reach Nurse Communication: Mobility status PT Visit Diagnosis: Difficulty in walking, not elsewhere classified (R26.2);Hemiplegia and hemiparesis Hemiplegia - Right/Left: Left Hemiplegia - dominant/non-dominant: Non-dominant Hemiplegia - caused by: Cerebral infarction    Time: 1006-1016 PT Time Calculation (min) (ACUTE  ONLY): 10 min   Charges:   PT Evaluation $PT Eval Low Complexity: 1 Low          Kamarri Fischetti H. Manson PasseyBrown, PT, DPT, NCS 12/19/18, 10:51 AM [phone]

## 2018-12-19 NOTE — Care Management Obs Status (Signed)
MEDICARE OBSERVATION STATUS NOTIFICATION   Patient Details  Name: Harold Sandoval MRN: [id number] Date of Birth: 09-01-1941   Medicare Observation Status Notification Given:  Yes    Junie Engram A Jahmir Salo, RN 12/19/2018, 10:31 AM

## 2018-12-19 NOTE — ED Notes (Signed)
Transport to floor room 102.AS 

## 2018-12-20 LAB — HEMOGLOBIN A1C
Hgb A1c MFr Bld: 6.6 % — ABNORMAL HIGH (ref 4.8–5.6)
Mean Plasma Glucose: 142.72 mg/dL

## 2018-12-20 LAB — GLUCOSE, CAPILLARY
Glucose-Capillary: 104 mg/dL — ABNORMAL HIGH (ref 70–99)
Glucose-Capillary: 119 mg/dL — ABNORMAL HIGH (ref 70–99)
Glucose-Capillary: 145 mg/dL — ABNORMAL HIGH (ref 70–99)
Glucose-Capillary: 79 mg/dL (ref 70–99)

## 2018-12-20 LAB — ECHOCARDIOGRAM COMPLETE

## 2018-12-20 MED ORDER — SALINE SPRAY 0.65 % NA SOLN
1.0000 | NASAL | Status: DC | PRN
  Filled 2018-12-20: qty 44

## 2018-12-20 NOTE — Plan of Care (Signed)
  Problem: Education: Goal: Knowledge of General Education information will improve Description Including pain rating scale, medication(s)/side effects and non-pharmacologic comfort measures Outcome: Progressing   Problem: Education: Goal: Knowledge of disease or condition will improve Outcome: Progressing Goal: Knowledge of secondary prevention will improve Outcome: Progressing Goal: Knowledge of patient specific risk factors addressed and post discharge goals established will improve Outcome: Progressing   Problem: Coping: Goal: Will identify appropriate support needs Outcome: Progressing   Problem: Health Behavior/Discharge Planning: Goal: Ability to manage health-related needs will improve Outcome: Progressing   Problem: Self-Care: Goal: Ability to communicate needs accurately will improve Outcome: Progressing   Problem: Nutrition: Goal: Risk of aspiration will decrease Outcome: Progressing   Problem: Ischemic Stroke/TIA Tissue Perfusion: Goal: Complications of ischemic stroke/TIA will be minimized Outcome: Progressing

## 2018-12-20 NOTE — Progress Notes (Signed)
OT Cancellation Note  Patient Details Name: Harold Sandoval MRN: [id number] DOB: February 05, 1941   Cancelled Treatment:    Reason Eval/Treat Not Completed: OT screened, no needs identified, will sign off. Consult received, chart reviewed. Pt back to baseline functional independence. Denies sensory deficits or functional difficulties using LUE. Denies any other neurological deficits. Pt independent with ADL. No skilled OT needs identified. Will sign off. Please re-consult if additional needs arise.   Richrd Prime, MPH, MS, OTR/L ascom [phone] 12/20/18, 8:45 AM

## 2018-12-20 NOTE — Progress Notes (Signed)
SLP Cancellation Note  Patient Details Name: Harold Sandoval MRN: [id number] DOB: 05-Jan-1941   Cancelled treatment:       Reason Eval/Treat Not Completed: SLP screened, no needs identified, will sign off ; Chart reviewed. Patient, wife, and nursing interviewed. No apparent evidence of dysarthria, aphasia, cognitive-linguistic deficits, or dysphagia throughout screen. Pt denies any difficulty with his speech, language, cognition or swallowing. Spontaneous speech is 100% fluent and intelligible. Pt and nsg report toleration of diet with no s/s aspiration. No current needs identified, will sign off. Please re-consult with any future change in status requiring re-assessment. Thank you for the consult.  Laporsha Grealish, MA, CCC-SLP 12/20/2018, 11:43 AM

## 2018-12-20 NOTE — Progress Notes (Signed)
[facility] - [place] at [facility]   PATIENT NAME: Harold Sandoval    MR#:  [id number]  DATE OF BIRTH:  July 18, 1941  SUBJECTIVE: Admitted for left hand pain, weakness of left hand and found to have acute infarct in right side.  Patient has no symptoms of weakness or numbness and feels completely normal now.  Patient wife is requesting TEE on this admission, discussed with Dr. Lady Gary patient can likely have TEE tomorrow.  CHIEF COMPLAINT:   Chief Complaint  Patient presents with  . Numbness    REVIEW OF SYSTEMS:   ROS CONSTITUTIONAL: No fever, fatigue or weakness.  EYES: No blurred or double vision.  EARS, NOSE, AND THROAT: No tinnitus or ear pain.  RESPIRATORY: No cough, shortness of breath, wheezing or hemoptysis.  CARDIOVASCULAR: No chest pain, orthopnea, edema.  GASTROINTESTINAL: No nausea, vomiting, diarrhea or abdominal pain.  GENITOURINARY: No dysuria, hematuria.  ENDOCRINE: No polyuria, nocturia,  HEMATOLOGY: No anemia, easy bruising or bleeding SKIN: No rash or lesion. MUSCULOSKELETAL: No joint pain or arthritis.   NEUROLOGIC: No tingling, numbness, weakness.  PSYCHIATRY: No anxiety or depression.   DRUG ALLERGIES:  No Known Allergies  VITALS:  Blood pressure 127/76, pulse 72, temperature 98.3 F (36.8 C), temperature source Oral, resp. rate 18, height 6\' 4"  (1.93 m), weight 87.9 kg, SpO2 98 %.  PHYSICAL EXAMINATION:  GENERAL:  78 y.o.-year-old patient lying in the bed with no acute distress.  EYES: Pupils equal, round, reactive to light and accommodation. No scleral icterus. Extraocular muscles intact.  HEENT: Head atraumatic, normocephalic. Oropharynx and nasopharynx clear.  NECK:  Supple, no jugular venous distention. No thyroid enlargement, no tenderness.  LUNGS: Normal breath sounds bilaterally, no wheezing, rales,rhonchi or crepitation. No use of accessory muscles of respiration.  CARDIOVASCULAR: S1, S2 normal. No murmurs, rubs, or  gallops.  ABDOMEN: Soft, nontender, nondistended. Bowel sounds present. No organomegaly or mass.  EXTREMITIES: No pedal edema, cyanosis, or clubbing.  NEUROLOGIC: Cranial nerves II through XII are intact. Muscle strength 5/5 in all extremities. Sensation intact. Gait not checked.  PSYCHIATRIC: The patient is alert and oriented x 3.  SKIN: No obvious rash, lesion, or ulcer.    LABORATORY PANEL:   CBC Recent Labs  Lab 12/18/18 2104  WBC 6.9  HGB 15.2  HCT 46.6  PLT 130*   ------------------------------------------------------------------------------------------------------------------  Chemistries  Recent Labs  Lab 12/18/18 2104  NA 142  K 4.1  CL 104  CO2 30  GLUCOSE 127*  BUN 14  CREATININE 0.90  CALCIUM 9.9   ------------------------------------------------------------------------------------------------------------------  Cardiac Enzymes Recent Labs  Lab 12/18/18 2104  TROPONINI <0.03   ------------------------------------------------------------------------------------------------------------------  RADIOLOGY:  Dg Chest 1 View  Result Date: 12/18/2018 CLINICAL DATA:  Left arm numbness since around 1700 hours. Previous history of stroke. EXAM: CHEST  1 VIEW COMPARISON:  02/03/2018 FINDINGS: Hyperinflation suggesting emphysema. Normal heart size and pulmonary vascularity. No focal airspace disease or consolidation in the lungs. No blunting of costophrenic angles. No pneumothorax. Mediastinal contours appear intact. Loop recorder is present. Calcification of the aorta. Degenerative changes in the spine and shoulders. IMPRESSION: Emphysematous changes in the lungs. No evidence of active pulmonary disease. Electronically Signed   By: Burman Nieves M.D.   On: 12/18/2018 21:33   Ct Head Wo Contrast  Result Date: 12/18/2018 CLINICAL DATA:  Acute onset of LEFT UPPER EXTREMITY numbness that began around 5 o'clock p.m. this afternoon. Personal history of stroke. EXAM: CT  HEAD WITHOUT CONTRAST TECHNIQUE: Contiguous axial images  were obtained from the base of the skull through the vertex without intravenous contrast. COMPARISON:  MRI brain 09/07/2018. CT head 09/06/2018. FINDINGS: Brain: Ventricular system normal in size and appearance for age. Remote LEFT parietal cortical stroke with encephalomalacia, unchanged. Remote lacunar stroke in the LEFT basal ganglia, unchanged. Moderate changes of small vessel disease of the white matter diffusely, unchanged. No mass lesion. No midline shift. No acute hemorrhage or hematoma. No extra-axial fluid collections. No evidence of acute infarction. 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 No hyperdense vessel. Skull: No skull fracture or other focal osseous abnormality involving the skull. Sinuses/Orbits: Visualized paranasal sinuses, bilateral mastoid air cells and bilateral middle ear cavities well-aerated. Visualized orbits and globes normal in appearance. Other: None. IMPRESSION: 1. No acute intracranial abnormality. 2. Stable old LEFT parietal cortical stroke with encephalomalacia and old LEFT basal ganglia lacunar stroke. 3. Stable moderate chronic microvascular ischemic changes of the white matter. Electronically Signed   By: Hulan Saashomas  Lawrence M.D.   On: 12/18/2018 21:39   Mr Brain Wo Contrast  Result Date: 12/19/2018 CLINICAL DATA:  Intermittent left upper extremity numbness beginning yesterday at 5:00 p.m. EXAM: MRI HEAD WITHOUT CONTRAST MRA HEAD WITHOUT CONTRAST TECHNIQUE: Multiplanar, multiecho pulse sequences of the brain and surrounding structures were obtained without intravenous contrast. Angiographic images of the head were obtained using MRA technique without contrast. COMPARISON:  CT head without contrast 12/18/2018. FINDINGS: MRI HEAD FINDINGS Brain: A punctate focus of restricted diffusion is present in the postcentral gyrus on image 42 of series 6. No other acute or subacute infarct is  present. A remote left parietal infarct is present. Extensive confluent periventricular white matter changes are noted bilaterally. Remote lacunar infarct is present in the left caudate head. Remote lacunar infarct is again noted in the posterior right lentiform nucleus. A remote lacunar infarct is present in the left thalamus. Brainstem and cerebellum are within normal limits. The internal auditory canals are within normal limits. Vascular: Flow is present in the major intracranial arteries. Skull and upper cervical spine: The craniocervical junction is normal. Upper cervical spine is within normal limits. Marrow signal is unremarkable. Sinuses/Orbits: A posterior polyp or mucous retention cyst is again noted in the left maxillary sinus. Mild mucosal thickening is present in the anterior ethmoid air cells. There is some fluid in the inferior mastoid air cells bilaterally. No obstructing nasopharyngeal lesion is present. Other: MRA HEAD FINDINGS Mild atherosclerotic changes are present within the cavernous internal carotid arteries bilaterally without a significant stenosis. The A1 and M1 segments are within normal limits. Anterior communicating artery is patent. MCA bifurcations are intact. There is mild irregularity of distal MCA branch vessels bilaterally without a significant proximal stenosis or occlusion. ACA branch vessels are within normal limits. Vertebrobasilar junction is normal. PICA origins are not imaged. Prominent AICA vessels are normal bilaterally. The basilar artery is normal. Both posterior cerebral arteries originate from basilar tip. PCA branch vessels are within normal limits. IMPRESSION: 1. Punctate nonhemorrhagic acute/subacute infarct involving the right postcentral gyrus corresponds with the patient's left upper extremity numbness. 2. No other acute intracranial abnormality. 3. Remote left parietal lobe infarct and encephalomalacia. 4. Mild atrophy with advanced confluent periventricular  white matter disease. This likely reflects the sequela of chronic microvascular ischemia. 5. Stable remote lacunar infarcts of the basal ganglia bilaterally. 6. Normal variant MRA circle-of-Willis without significant proximal stenosis, aneurysm, or branch vessel occlusion. 7. Mild atherosclerotic changes within the cavernous internal carotid arteries bilaterally without a significant  stenosis. Electronically Signed   By: Marin Robertshristopher  Mattern M.D.   On: 12/19/2018 08:32   [place] Carotid Bilateral (at [facility] And [facility] Only)  Result Date: 12/19/2018 CLINICAL DATA:  78 year old male with acute punctate right cortical infarct. EXAM: BILATERAL CAROTID DUPLEX ULTRASOUND TECHNIQUE: Wallace CullensGray scale imaging, color Doppler and duplex ultrasound were performed of bilateral carotid and vertebral arteries in the neck. COMPARISON:  Relatively recent prior carotid duplex ultrasound 09/07/2018 FINDINGS: Criteria: Quantification of carotid stenosis is based on velocity parameters that correlate the residual internal carotid diameter with NASCET-based stenosis levels, using the diameter of the distal internal carotid lumen as the denominator for stenosis measurement. The following velocity measurements were obtained: RIGHT ICA: 79/28 cm/sec CCA: 94/18 cm/sec SYSTOLIC ICA/CCA RATIO:  0.8 ECA:  77 cm/sec LEFT ICA: 82/28 cm/sec CCA: 75/21 cm/sec SYSTOLIC ICA/CCA RATIO:  1.1 ECA:  62 cm/sec RIGHT CAROTID ARTERY: Unchanged mild heterogeneous atherosclerotic plaque in the proximal internal carotid artery. By peak systolic velocity criteria, the estimated stenosis remains less than 50%. RIGHT VERTEBRAL ARTERY:  Patent with normal antegrade flow. LEFT CAROTID ARTERY: Unchanged mild heterogeneous atherosclerotic plaque in the proximal internal carotid artery. By peak systolic velocity criteria, the estimated stenosis remains less than 50%. LEFT VERTEBRAL ARTERY:  Patent with normal antegrade flow. IMPRESSION: 1. No significant change or interval  progression of bilateral mild carotid artery disease compared to 09/07/2018. 2. Mild (1-49%) stenosis proximal right internal carotid artery secondary to mild heterogeneous atherosclerotic plaque. 3. Mild (1-49%) stenosis proximal left internal carotid artery secondary to mild heterogeneous atherosclerotic plaque. 4. Vertebral arteries are patent with normal antegrade flow. Electronically Signed   By: Malachy MoanHeath  McCullough M.D.   On: 12/19/2018 08:41   Mr Maxine GlennMra Head Wo Contrast  Result Date: 12/19/2018 CLINICAL DATA:  Intermittent left upper extremity numbness beginning yesterday at 5:00 p.m. EXAM: MRI HEAD WITHOUT CONTRAST MRA HEAD WITHOUT CONTRAST TECHNIQUE: Multiplanar, multiecho pulse sequences of the brain and surrounding structures were obtained without intravenous contrast. Angiographic images of the head were obtained using MRA technique without contrast. COMPARISON:  CT head without contrast 12/18/2018. FINDINGS: MRI HEAD FINDINGS Brain: A punctate focus of restricted diffusion is present in the postcentral gyrus on image 42 of series 6. No other acute or subacute infarct is present. A remote left parietal infarct is present. Extensive confluent periventricular white matter changes are noted bilaterally. Remote lacunar infarct is present in the left caudate head. Remote lacunar infarct is again noted in the posterior right lentiform nucleus. A remote lacunar infarct is present in the left thalamus. Brainstem and cerebellum are within normal limits. The internal auditory canals are within normal limits. Vascular: Flow is present in the major intracranial arteries. Skull and upper cervical spine: The craniocervical junction is normal. Upper cervical spine is within normal limits. Marrow signal is unremarkable. Sinuses/Orbits: A posterior polyp or mucous retention cyst is again noted in the left maxillary sinus. Mild mucosal thickening is present in the anterior ethmoid air cells. There is some fluid in the  inferior mastoid air cells bilaterally. No obstructing nasopharyngeal lesion is present. Other: MRA HEAD FINDINGS Mild atherosclerotic changes are present within the cavernous internal carotid arteries bilaterally without a significant stenosis. The A1 and M1 segments are within normal limits. Anterior communicating artery is patent. MCA bifurcations are intact. There is mild irregularity of distal MCA branch vessels bilaterally without a significant proximal stenosis or occlusion. ACA branch vessels are within normal limits. Vertebrobasilar junction is normal. PICA origins are not imaged. Prominent AICA  vessels are normal bilaterally. The basilar artery is normal. Both posterior cerebral arteries originate from basilar tip. PCA branch vessels are within normal limits. IMPRESSION: 1. Punctate nonhemorrhagic acute/subacute infarct involving the right postcentral gyrus corresponds with the patient's left upper extremity numbness. 2. No other acute intracranial abnormality. 3. Remote left parietal lobe infarct and encephalomalacia. 4. Mild atrophy with advanced confluent periventricular white matter disease. This likely reflects the sequela of chronic microvascular ischemia. 5. Stable remote lacunar infarcts of the basal ganglia bilaterally. 6. Normal variant MRA circle-of-Willis without significant proximal stenosis, aneurysm, or branch vessel occlusion. 7. Mild atherosclerotic changes within the cavernous internal carotid arteries bilaterally without a significant stenosis. Electronically Signed   By: Marin Roberts M.D.   On: 12/19/2018 08:32    EKG:   Orders placed or performed during the hospital encounter of 12/19/18  . ED EKG  . ED EKG    ASSESSMENT AND PLAN:   78 year old male patient with history of previous stroke, proximal atrial fibrillation on Eliquis, diabetes mellitus type 2, hyperlipidemia, hypertension came in because of left armpain, numbness and found to have acute stroke.  #1  recurrent strokes, patient has 2 strokes in less than 4 months , on Eliquis, added aspirin on this admission,, echocardiogram did not show any emboli, current ultrasound did not show any hemodynamically significant stenosis, patient hemoglobin A1c also is less than 7, LDL is a less than 70, because of recurrent strokes wife is requesting ED on this admission, Dr. Lady Gary.;  Continue aspirin, statins, apixaban, #2 diabetes mellitus type 2; continue metformin. 3.  History of cryptogenic stroke; patient has L IN Q monitor. 4.  Hypertension Discussed with wife, also discussed with Dr. Lady Gary More than 50% time spent in counseling, coordination of care All the records are reviewed and case discussed with Care Management/Social Workerr. Management plans discussed with the patient, family and they are in agreement.  CODE STATUS: Full code  TOTAL TIME TAKING CARE OF THIS PATIENT: 38 minutes.   POSSIBLE D/C IN 1-2 DAYS, DEPENDING ON CLINICAL CONDITION.   Katha Hamming M.D on 12/20/2018 at 12:06 PM  Between 7am to 6pm - Pager - [phone]  After 6pm go to [email or web address] - password [email or web address] [facility]  [facility]  Office  [phone]  CC: Primary care physician; Gracelyn Nurse, MD   Note: This dictation was prepared with Dragon dictation along with smaller phrase technology. Any transcriptional errors that result from this process are unintentional.

## 2018-12-20 NOTE — Progress Notes (Signed)
[facility]  SUBJECTIVE: Harold. Harold Harold Sandoval is a 78 year old male with a past medical history significant for CVA, on Eliquis, type 2 diabetes, and hyperlipidemia who presented to the ED on 12/19/18 for intermittent left arm numbness, weakness, and paraesthesias.  Denied speech disturbance or vision changes.  Harold [name] revealed a punctate nonhemorrhagic acute/subacute infarct involving the right postcentral gyrus.  No other acute abnormality. 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  Previous 30 day Holter monitor was negative for atrial fibrillation. [employer] Linq device is in place.   Today, Harold Harold Sandoval admitted to a 3 minute, left hand numbness sensation that occurred last night.  Symptoms resolved and currently denies left sided weakness or paraesthesias.  Denies chest pain, palpitations, or heart racing sensation.  Has been compliant with Eliquis 5mg  twice daily.    Vitals:   12/20/18 0040 12/20/18 0430 12/20/18 0500 12/20/18 0800  BP: 102/73 (!) 115/52  127/76  Pulse: (!) 57 66  72  Resp: 16 16  18   Temp:  98.8 F (37.1 C)  98.3 F (36.8 C)  TempSrc:  Oral  Oral  SpO2: 96% 98%    Weight:   87.9 kg   Height:         Intake/Output Summary (Last 24 hours) at 12/20/2018 1042 Last data filed at 12/20/2018 8185 Gross per 24 hour  Intake 240 ml  Output -  Net 240 ml      PHYSICAL EXAM  General: Well developed, well nourished, in no acute distress HEENT:  Normocephalic and atramatic Neck:  No JVD.  Lungs: Clear bilaterally to auscultation and percussion. Heart: HRRR . Normal S1 and S2 without gallops or murmurs.  Abdomen: Bowel sounds are positive, abdomen soft and non-tender  Msk:  Back normal, normal gait. Normal strength and tone for age. Extremities: No clubbing, cyanosis or edema.   Neuro: Alert and oriented X 3. Psych:  Good affect, responds appropriately   LABS: Basic Metabolic Panel: Recent Labs     12/18/18 2104  NA 142  K 4.1  CL 104  CO2 30  GLUCOSE 127*  BUN 14  CREATININE 0.90  CALCIUM 9.9   Liver Function Tests: No results for input(s): AST, ALT, ALKPHOS, BILITOT, PROT, ALBUMIN in the last 72 hours. No results for input(s): LIPASE, AMYLASE in the last 72 hours. CBC: Recent Labs    12/18/18 2104  WBC 6.9  NEUTROABS 3.5  HGB 15.2  HCT 46.6  MCV 90.7  PLT 130*   Cardiac Enzymes: Recent Labs    12/18/18 2104  TROPONINI <0.03   BNP: Invalid input(s): POCBNP D-Dimer: No results for input(s): DDIMER in the last 72 hours. Hemoglobin A1C: Recent Labs    12/20/18 0451  HGBA1C 6.6*   Fasting Lipid Panel: Recent Labs    12/18/18 2104  CHOL 103  HDL 47  LDLCALC 47  TRIG 47  CHOLHDL 2.2   Thyroid Function Tests: Recent Labs    12/18/18 2104  TSH 1.703   Anemia Panel: No results for input(s): VITAMINB12, FOLATE, FERRITIN, TIBC, IRON, RETICCTPCT in the last 72 hours.  Dg Chest 1 View  Result Date: 12/18/2018 CLINICAL DATA:  Left arm numbness since around 1700 hours. Previous history of stroke. EXAM: CHEST  1 VIEW COMPARISON:  02/03/2018 FINDINGS: Hyperinflation suggesting emphysema. Normal heart size and pulmonary vascularity. No focal airspace disease or consolidation in the lungs. No blunting of costophrenic angles. No pneumothorax. Mediastinal contours appear  intact. Loop recorder is present. Calcification of the aorta. Degenerative changes in the spine and shoulders. IMPRESSION: Emphysematous changes in the lungs. No evidence of active pulmonary disease. Electronically Signed   By: Burman NievesWilliam  Stevens M.D.   On: 12/18/2018 21:33   Ct Head Wo Contrast  Result Date: 12/18/2018 CLINICAL DATA:  Acute onset of LEFT UPPER EXTREMITY numbness that began around 5 o'clock p.m. this afternoon. Personal history of stroke. EXAM: CT HEAD WITHOUT CONTRAST TECHNIQUE: Contiguous axial images were obtained from the base of the skull through the vertex without intravenous  contrast. COMPARISON:  MRI Harold Sandoval 09/07/2018. CT head 09/06/2018. FINDINGS: Harold Sandoval: Ventricular system normal in size and appearance for age. Remote LEFT parietal cortical stroke with encephalomalacia, unchanged. Remote lacunar stroke in the LEFT basal ganglia, unchanged. Moderate changes of small vessel disease of the white matter diffusely, unchanged. No mass lesion. No midline shift. No acute hemorrhage or hematoma. No extra-axial fluid collections. No evidence of acute infarction. 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 No hyperdense vessel. Skull: No skull fracture or other focal osseous abnormality involving the skull. Sinuses/Orbits: Visualized paranasal sinuses, bilateral mastoid air cells and bilateral middle ear cavities well-aerated. Visualized orbits and globes normal in appearance. Other: None. IMPRESSION: 1. No acute intracranial abnormality. 2. Stable old LEFT parietal cortical stroke with encephalomalacia and old LEFT basal ganglia lacunar stroke. 3. Stable moderate chronic microvascular ischemic changes of the white matter. Electronically Signed   By: Hulan Saashomas  Lawrence M.D.   On: 12/18/2018 21:39   Harold Harold Sandoval Wo Contrast  Result Date: 12/19/2018 CLINICAL DATA:  Intermittent left upper extremity numbness beginning yesterday at 5:00 p.m. EXAM: MRI HEAD WITHOUT CONTRAST MRA HEAD WITHOUT CONTRAST TECHNIQUE: Multiplanar, multiecho pulse sequences of the Harold Sandoval and surrounding structures were obtained without intravenous contrast. Angiographic images of the head were obtained using MRA technique without contrast. COMPARISON:  CT head without contrast 12/18/2018. FINDINGS: MRI HEAD FINDINGS Harold Sandoval: A punctate focus of restricted diffusion is present in the postcentral gyrus on image 42 of series 6. No other acute or subacute infarct is present. A remote left parietal infarct is present. Extensive confluent periventricular white matter changes are noted bilaterally.  Remote lacunar infarct is present in the left caudate head. Remote lacunar infarct is again noted in the posterior right lentiform nucleus. A remote lacunar infarct is present in the left thalamus. Brainstem and cerebellum are within normal limits. The internal auditory canals are within normal limits. Vascular: Flow is present in the major intracranial arteries. Skull and upper cervical spine: The craniocervical junction is normal. Upper cervical spine is within normal limits. Marrow signal is unremarkable. Sinuses/Orbits: A posterior polyp or mucous retention cyst is again noted in the left maxillary sinus. Mild mucosal thickening is present in the anterior ethmoid air cells. There is some fluid in the inferior mastoid air cells bilaterally. No obstructing nasopharyngeal lesion is present. Other: MRA HEAD FINDINGS Mild atherosclerotic changes are present within the cavernous internal carotid arteries bilaterally without a significant stenosis. The A1 and M1 segments are within normal limits. Anterior communicating artery is patent. MCA bifurcations are intact. There is mild irregularity of distal MCA branch vessels bilaterally without a significant proximal stenosis or occlusion. ACA branch vessels are within normal limits. Vertebrobasilar junction is normal. PICA origins are not imaged. Prominent AICA vessels are normal bilaterally. The basilar artery is normal. Both posterior cerebral arteries originate from basilar tip. PCA branch vessels are within normal limits. IMPRESSION: 1. Punctate nonhemorrhagic acute/subacute infarct involving  the right postcentral gyrus corresponds with the patient's left upper extremity numbness. 2. No other acute intracranial abnormality. 3. Remote left parietal lobe infarct and encephalomalacia. 4. Mild atrophy with advanced confluent periventricular white matter disease. This likely reflects the sequela of chronic microvascular ischemia. 5. Stable remote lacunar infarcts of the  basal ganglia bilaterally. 6. Normal variant MRA circle-of-Willis without significant proximal stenosis, aneurysm, or branch vessel occlusion. 7. Mild atherosclerotic changes within the cavernous internal carotid arteries bilaterally without a significant stenosis. Electronically Signed   By: Marin Robertshristopher  Mattern M.D.   On: 12/19/2018 08:32   [place] Carotid Bilateral (at [facility] Only)  Result Date: 12/19/2018 CLINICAL DATA:  78 year old male with acute punctate right cortical infarct. EXAM: BILATERAL CAROTID DUPLEX ULTRASOUND TECHNIQUE: Wallace CullensGray scale imaging, color Doppler and duplex ultrasound were performed of bilateral carotid and vertebral arteries in the neck. COMPARISON:  Relatively recent prior carotid duplex ultrasound 09/07/2018 FINDINGS: Criteria: Quantification of carotid stenosis is based on velocity parameters that correlate the residual internal carotid diameter with NASCET-based stenosis levels, using the diameter of the distal internal carotid lumen as the denominator for stenosis measurement. The following velocity measurements were obtained: RIGHT ICA: 79/28 cm/sec CCA: 94/18 cm/sec SYSTOLIC ICA/CCA RATIO:  0.8 ECA:  77 cm/sec LEFT ICA: 82/28 cm/sec CCA: 75/21 cm/sec SYSTOLIC ICA/CCA RATIO:  1.1 ECA:  62 cm/sec RIGHT CAROTID ARTERY: Unchanged mild heterogeneous atherosclerotic plaque in the proximal internal carotid artery. By peak systolic velocity criteria, the estimated stenosis remains less than 50%. RIGHT VERTEBRAL ARTERY:  Patent with normal antegrade flow. LEFT CAROTID ARTERY: Unchanged mild heterogeneous atherosclerotic plaque in the proximal internal carotid artery. By peak systolic velocity criteria, the estimated stenosis remains less than 50%. LEFT VERTEBRAL ARTERY:  Patent with normal antegrade flow. IMPRESSION: 1. No significant change or interval progression of bilateral mild carotid artery disease compared to 09/07/2018. 2. Mild (1-49%) stenosis proximal right internal carotid  artery secondary to mild heterogeneous atherosclerotic plaque. 3. Mild (1-49%) stenosis proximal left internal carotid artery secondary to mild heterogeneous atherosclerotic plaque. 4. Vertebral arteries are patent with normal antegrade flow. Electronically Signed   By: Malachy MoanHeath  McCullough M.D.   On: 12/19/2018 08:41   Harold Maxine GlennMra Head Wo Contrast  Result Date: 12/19/2018 CLINICAL DATA:  Intermittent left upper extremity numbness beginning yesterday at 5:00 p.m. EXAM: MRI HEAD WITHOUT CONTRAST MRA HEAD WITHOUT CONTRAST TECHNIQUE: Multiplanar, multiecho pulse sequences of the Harold Sandoval and surrounding structures were obtained without intravenous contrast. Angiographic images of the head were obtained using MRA technique without contrast. COMPARISON:  CT head without contrast 12/18/2018. FINDINGS: MRI HEAD FINDINGS Harold Sandoval: A punctate focus of restricted diffusion is present in the postcentral gyrus on image 42 of series 6. No other acute or subacute infarct is present. A remote left parietal infarct is present. Extensive confluent periventricular white matter changes are noted bilaterally. Remote lacunar infarct is present in the left caudate head. Remote lacunar infarct is again noted in the posterior right lentiform nucleus. A remote lacunar infarct is present in the left thalamus. Brainstem and cerebellum are within normal limits. The internal auditory canals are within normal limits. Vascular: Flow is present in the major intracranial arteries. Skull and upper cervical spine: The craniocervical junction is normal. Upper cervical spine is within normal limits. Marrow signal is unremarkable. Sinuses/Orbits: A posterior polyp or mucous retention cyst is again noted in the left maxillary sinus. Mild mucosal thickening is present in the anterior ethmoid air cells. There is some fluid in the inferior  mastoid air cells bilaterally. No obstructing nasopharyngeal lesion is present. Other: MRA HEAD FINDINGS Mild atherosclerotic  changes are present within the cavernous internal carotid arteries bilaterally without a significant stenosis. The A1 and M1 segments are within normal limits. Anterior communicating artery is patent. MCA bifurcations are intact. There is mild irregularity of distal MCA branch vessels bilaterally without a significant proximal stenosis or occlusion. ACA branch vessels are within normal limits. Vertebrobasilar junction is normal. PICA origins are not imaged. Prominent AICA vessels are normal bilaterally. The basilar artery is normal. Both posterior cerebral arteries originate from basilar tip. PCA branch vessels are within normal limits. IMPRESSION: 1. Punctate nonhemorrhagic acute/subacute infarct involving the right postcentral gyrus corresponds with the patient's left upper extremity numbness. 2. No other acute intracranial abnormality. 3. Remote left parietal lobe infarct and encephalomalacia. 4. Mild atrophy with advanced confluent periventricular white matter disease. This likely reflects the sequela of chronic microvascular ischemia. 5. Stable remote lacunar infarcts of the basal ganglia bilaterally. 6. Normal variant MRA circle-of-Willis without significant proximal stenosis, aneurysm, or branch vessel occlusion. 7. Mild atherosclerotic changes within the cavernous internal carotid arteries bilaterally without a significant stenosis. Electronically Signed   By: Marin Roberts M.D.   On: 12/19/2018 08:32     Echo:  Normal systolic function with an EF estimated between 55 and 60% with moderate LVH.  No significant valvular abnormalities.  No cardiac source of emboli identified.   TELEMETRY: Normal sinus rhythm   ASSESSMENT AND PLAN:  Active Problems:   TIA (transient ischemic attack)    1.  Recurrent TIAs  -TEE in the past has revealed Lamb'l excrescences  -Continue on Eliquis 5mg  twice daily   -Will continue LINQ interrogations to evaluate for evidence of atrial fibrillation    -Neurology consult; close follow up on an outpatient basis  2.  Type 2 diabetes   -Continue insulin therapy  3.  Hyperlipidemia   -Continue atorvastatin 80mg  daily    The history, physical exam findings, and plan of care were all discussed with Dr. Harold Hedge, and all decision making was made in collaboration.   Andi Hence  PA-C 12/20/2018 10:42 AM

## 2018-12-20 NOTE — Progress Notes (Signed)
Patient complaining of left hand numbness and tingling, symptoms had resolved prior to. Notified Dr. Sheryle Hail, no new orders received.

## 2018-12-21 ENCOUNTER — Encounter
Admission: EM | Disposition: A | Discharge status: Home Health | Payer: Self-pay | Source: Home / Self Care | Attending: Emergency Medicine

## 2018-12-21 ENCOUNTER — Observation Stay
Admit: 2018-12-21 | Discharge: 2018-12-21 | Disposition: A | Discharge status: Home / Self Care | Payer: Medicare HMO | Attending: Cardiology | Admitting: Cardiology

## 2018-12-21 HISTORY — PX: TEE WITHOUT CARDIOVERSION: SHX5443

## 2018-12-21 LAB — GLUCOSE, CAPILLARY
Glucose-Capillary: 114 mg/dL — ABNORMAL HIGH (ref 70–99)
Glucose-Capillary: 96 mg/dL (ref 70–99)

## 2018-12-21 LAB — CREATININE, SERUM
Creatinine, Ser: 0.72 mg/dL (ref 0.61–1.24)
GFR calc Af Amer: >60 mL/min
GFR calc non Af Amer: >60 mL/min

## 2018-12-21 LAB — CBC
HCT: 46.5 % (ref 39.0–52.0)
Hemoglobin: 15.5 g/dL (ref 13.0–17.0)
MCH: 30.1 pg (ref 26.0–34.0)
MCHC: 33.3 g/dL (ref 30.0–36.0)
MCV: 90.3 fL (ref 80.0–100.0)
Platelets: 209 10*3/uL (ref 150–400)
RBC: 5.15 MIL/uL (ref 4.22–5.81)
RDW: 13.1 % (ref 11.5–15.5)
WBC: 5.1 10*3/uL (ref 4.0–10.5)
nRBC: 0 % (ref 0.0–0.2)

## 2018-12-21 SURGERY — TRANSESOPHAGEAL ECHOCARDIOGRAM (TEE)
Anesthesia: Moderate Sedation

## 2018-12-21 MED ORDER — LIDOCAINE VISCOUS HCL 2 % MT SOLN
OROMUCOSAL | Status: AC
  Filled 2018-12-21: qty 15

## 2018-12-21 MED ORDER — BUTAMBEN-TETRACAINE-BENZOCAINE 2-2-14 % EX AERO
INHALATION_SPRAY | CUTANEOUS | Status: AC
  Filled 2018-12-21: qty 5

## 2018-12-21 MED ORDER — LIDOCAINE VISCOUS HCL 2 % MT SOLN
OROMUCOSAL | Status: AC | PRN
  Administered 2018-12-21: 15 mL via OROMUCOSAL

## 2018-12-21 MED ORDER — SODIUM CHLORIDE 0.9 % IV SOLN
INTRAVENOUS | Status: DC
  Administered 2018-12-21: 09:00:00 via INTRAVENOUS

## 2018-12-21 MED ORDER — SODIUM CHLORIDE FLUSH 0.9 % IV SOLN
INTRAVENOUS | Status: AC
  Filled 2018-12-21: qty 10

## 2018-12-21 MED ORDER — FENTANYL CITRATE (PF) 100 MCG/2ML IJ SOLN
INTRAMUSCULAR | Status: AC
  Filled 2018-12-21: qty 2

## 2018-12-21 MED ORDER — MIDAZOLAM HCL 5 MG/5ML IJ SOLN
INTRAMUSCULAR | Status: AC | PRN
  Administered 2018-12-21: 1 mg via INTRAVENOUS
  Administered 2018-12-21: 2 mg via INTRAVENOUS

## 2018-12-21 MED ORDER — MIDAZOLAM HCL 5 MG/5ML IJ SOLN
INTRAMUSCULAR | Status: AC
  Filled 2018-12-21: qty 5

## 2018-12-21 MED ORDER — BUTAMBEN-TETRACAINE-BENZOCAINE 2-2-14 % EX AERO
INHALATION_SPRAY | CUTANEOUS | Status: AC | PRN
  Administered 2018-12-21: 3 via TOPICAL

## 2018-12-21 MED ORDER — FENTANYL CITRATE (PF) 100 MCG/2ML IJ SOLN
INTRAMUSCULAR | Status: AC | PRN
  Administered 2018-12-21: 50 ug via INTRAVENOUS
  Administered 2018-12-21: 25 ug via INTRAVENOUS

## 2018-12-21 NOTE — Sedation Documentation (Signed)
Vital signs stable. 

## 2018-12-21 NOTE — Sedation Documentation (Signed)
Total Sedation: Versed 3 mg IV, Fentanyl 75 mcg IV, Cetacaine spray x 3, Viscous lido swish & swallow. Pt. Tolerated TEE procedure well.

## 2018-12-21 NOTE — Progress Notes (Signed)
*  PRELIMINARY RESULTS* Echocardiogram Echocardiogram Transesophageal has been performed.  Cristela Blue 12/21/2018, 9:56 AM

## 2018-12-21 NOTE — Sedation Documentation (Signed)
Bubble study performed during TEE.   

## 2018-12-21 NOTE — Care Management Note (Signed)
Case Management Note  Patient Details  Name: [name] MRN: [id number] Date of Birth: Aug 25, 1941  Subjective/Objective:                 Patient for discharge home.  [facility] to transport. [facility] primary provider to arrange for hospice services  Action/Plan:   Expected Discharge Date:  12/21/18               Expected Discharge Plan:     In-House Referral:     Discharge planning Services     Post Acute Care Choice:    Choice offered to:     DME Arranged:    DME Agency:     HH Arranged:    HH Agency:     Status of Service:     If discussed at [employer], dates discussed:    Additional Comments:  Eber Hong, RN 12/21/2018, 3:04 PM

## 2018-12-21 NOTE — Progress Notes (Signed)
Patient Name: Harold Sandoval Date of Encounter: 12/21/2018  Hospital Problem List     Active Problems:   TIA (transient ischemic attack)    Patient Profile     78 year old male with history of CVA  Subjective   Numbness has improved  Inpatient Medications    . apixaban  5 mg Oral BID  . aspirin EC  81 mg Oral Daily  . atorvastatin  80 mg Oral QPM  . brimonidine  1 drop Both Eyes BID  . brinzolamide  1 drop Both Eyes BID  . docusate sodium  100 mg Oral BID  . fluticasone  2 spray Each Nare Daily  . insulin aspart  0-5 Units Subcutaneous QHS  . insulin aspart  0-9 Units Subcutaneous TID WC  . latanoprost  1 drop Both Eyes QHS  . polyethylene glycol  17 g Oral Daily    Vital Signs    Vitals:   12/20/18 1608 12/20/18 2009 12/21/18 0013 12/21/18 0430  BP: (!) 121/59 118/78 118/74 103/60  Pulse: 73 85 75 75  Resp: 18 18 15 18   Temp: 98.2 F (36.8 C) 97.9 F (36.6 C) 97.6 F (36.4 C) 98 F (36.7 C)  TempSrc:  Oral  Oral  SpO2: 97% 97% 96% 95%  Weight:    87.3 kg  Height:        Intake/Output Summary (Last 24 hours) at 12/21/2018 0739 Last data filed at 12/20/2018 1848 Gross per 24 hour  Intake 360 ml  Output -  Net 360 ml   Filed Weights   12/18/18 2056 12/20/18 0500 12/21/18 0430  Weight: 83.9 kg 87.9 kg 87.3 kg    Physical Exam    GEN: Well nourished, well developed, in no acute distress.  HEENT: normal.  Neck: Supple, no JVD, carotid bruits, or masses. Cardiac: RRR, no murmurs, rubs, or gallops. No clubbing, cyanosis, edema.  Radials/DP/PT 2+ and equal bilaterally.  Respiratory:  Respirations regular and unlabored, clear to auscultation bilaterally. GI: Soft, nontender, nondistended, BS + x 4. MS: no deformity or atrophy. Skin: warm and dry, no rash. Neuro:  Strength and sensation are intact. Psych: Normal affect.  Labs    CBC Recent Labs    12/18/18 2104 12/21/18 0706  WBC 6.9 5.1  NEUTROABS 3.5  --   HGB 15.2 15.5  HCT 46.6 46.5   MCV 90.7 90.3  PLT 130* 209   Basic Metabolic Panel Recent Labs    57/01/77 2104 12/21/18 0706  NA 142  --   K 4.1  --   CL 104  --   CO2 30  --   GLUCOSE 127*  --   BUN 14  --   CREATININE 0.90 0.72  CALCIUM 9.9  --    Liver Function Tests No results for input(s): AST, ALT, ALKPHOS, BILITOT, PROT, ALBUMIN in the last 72 hours. No results for input(s): LIPASE, AMYLASE in the last 72 hours. Cardiac Enzymes Recent Labs    12/18/18 2104  TROPONINI <0.03   BNP No results for input(s): BNP in the last 72 hours. D-Dimer No results for input(s): DDIMER in the last 72 hours. Hemoglobin A1C Recent Labs    12/20/18 0451  HGBA1C 6.6*   Fasting Lipid Panel Recent Labs    12/18/18 2104  CHOL 103  HDL 47  LDLCALC 47  TRIG 47  CHOLHDL 2.2   Thyroid Function Tests Recent Labs    12/18/18 2104  TSH 1.703    Telemetry  Sinus rhythm  ECG    Sinus rhythm with no ischemia  Radiology    Dg Chest 1 View  Result Date: 12/18/2018 CLINICAL DATA:  Left arm numbness since around 1700 hours. Previous history of stroke. EXAM: CHEST  1 VIEW COMPARISON:  02/03/2018 FINDINGS: Hyperinflation suggesting emphysema. Normal heart size and pulmonary vascularity. No focal airspace disease or consolidation in the lungs. No blunting of costophrenic angles. No pneumothorax. Mediastinal contours appear intact. Loop recorder is present. Calcification of the aorta. Degenerative changes in the spine and shoulders. IMPRESSION: Emphysematous changes in the lungs. No evidence of active pulmonary disease. Electronically Signed   By: Burman NievesWilliam  Stevens M.D.   On: 12/18/2018 21:33   Ct Head Wo Contrast  Result Date: 12/18/2018 CLINICAL DATA:  Acute onset of LEFT UPPER EXTREMITY numbness that began around 5 o'clock p.m. this afternoon. Personal history of stroke. EXAM: CT HEAD WITHOUT CONTRAST TECHNIQUE: Contiguous axial images were obtained from the base of the skull through the vertex without  intravenous contrast. COMPARISON:  MRI brain 09/07/2018. CT head 09/06/2018. FINDINGS: Brain: Ventricular system normal in size and appearance for age. Remote LEFT parietal cortical stroke with encephalomalacia, unchanged. Remote lacunar stroke in the LEFT basal ganglia, unchanged. Moderate changes of small vessel disease of the white matter diffusely, unchanged. No mass lesion. No midline shift. No acute hemorrhage or hematoma. No extra-axial fluid collections. No evidence of acute infarction. 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 No hyperdense vessel. Skull: No skull fracture or other focal osseous abnormality involving the skull. Sinuses/Orbits: Visualized paranasal sinuses, bilateral mastoid air cells and bilateral middle ear cavities well-aerated. Visualized orbits and globes normal in appearance. Other: None. IMPRESSION: 1. No acute intracranial abnormality. 2. Stable old LEFT parietal cortical stroke with encephalomalacia and old LEFT basal ganglia lacunar stroke. 3. Stable moderate chronic microvascular ischemic changes of the white matter. Electronically Signed   By: Hulan Saashomas  Lawrence M.D.   On: 12/18/2018 21:39   Mr Brain Wo Contrast  Result Date: 12/19/2018 CLINICAL DATA:  Intermittent left upper extremity numbness beginning yesterday at 5:00 p.m. EXAM: MRI HEAD WITHOUT CONTRAST MRA HEAD WITHOUT CONTRAST TECHNIQUE: Multiplanar, multiecho pulse sequences of the brain and surrounding structures were obtained without intravenous contrast. Angiographic images of the head were obtained using MRA technique without contrast. COMPARISON:  CT head without contrast 12/18/2018. FINDINGS: MRI HEAD FINDINGS Brain: A punctate focus of restricted diffusion is present in the postcentral gyrus on image 42 of series 6. No other acute or subacute infarct is present. A remote left parietal infarct is present. Extensive confluent periventricular white matter changes are noted  bilaterally. Remote lacunar infarct is present in the left caudate head. Remote lacunar infarct is again noted in the posterior right lentiform nucleus. A remote lacunar infarct is present in the left thalamus. Brainstem and cerebellum are within normal limits. The internal auditory canals are within normal limits. Vascular: Flow is present in the major intracranial arteries. Skull and upper cervical spine: The craniocervical junction is normal. Upper cervical spine is within normal limits. Marrow signal is unremarkable. Sinuses/Orbits: A posterior polyp or mucous retention cyst is again noted in the left maxillary sinus. Mild mucosal thickening is present in the anterior ethmoid air cells. There is some fluid in the inferior mastoid air cells bilaterally. No obstructing nasopharyngeal lesion is present. Other: MRA HEAD FINDINGS Mild atherosclerotic changes are present within the cavernous internal carotid arteries bilaterally without a significant stenosis. The A1 and M1 segments are within normal  limits. Anterior communicating artery is patent. MCA bifurcations are intact. There is mild irregularity of distal MCA branch vessels bilaterally without a significant proximal stenosis or occlusion. ACA branch vessels are within normal limits. Vertebrobasilar junction is normal. PICA origins are not imaged. Prominent AICA vessels are normal bilaterally. The basilar artery is normal. Both posterior cerebral arteries originate from basilar tip. PCA branch vessels are within normal limits. IMPRESSION: 1. Punctate nonhemorrhagic acute/subacute infarct involving the right postcentral gyrus corresponds with the patient's left upper extremity numbness. 2. No other acute intracranial abnormality. 3. Remote left parietal lobe infarct and encephalomalacia. 4. Mild atrophy with advanced confluent periventricular white matter disease. This likely reflects the sequela of chronic microvascular ischemia. 5. Stable remote lacunar  infarcts of the basal ganglia bilaterally. 6. Normal variant MRA circle-of-Willis without significant proximal stenosis, aneurysm, or branch vessel occlusion. 7. Mild atherosclerotic changes within the cavernous internal carotid arteries bilaterally without a significant stenosis. Electronically Signed   By: Marin Roberts M.D.   On: 12/19/2018 08:32   US Carotid Bilateral (at [facility] Only)  Result Date: 12/19/2018 CLINICAL DATA:  78 year old male with acute punctate right cortical infarct. EXAM: BILATERAL CAROTID DUPLEX ULTRASOUND TECHNIQUE: Wallace Cullens scale imaging, color Doppler and duplex ultrasound were performed of bilateral carotid and vertebral arteries in the neck. COMPARISON:  Relatively recent prior carotid duplex ultrasound 09/07/2018 FINDINGS: Criteria: Quantification of carotid stenosis is based on velocity parameters that correlate the residual internal carotid diameter with NASCET-based stenosis levels, using the diameter of the distal internal carotid lumen as the denominator for stenosis measurement. The following velocity measurements were obtained: RIGHT ICA: 79/28 cm/sec CCA: 94/18 cm/sec SYSTOLIC ICA/CCA RATIO:  0.8 ECA:  77 cm/sec LEFT ICA: 82/28 cm/sec CCA: 75/21 cm/sec SYSTOLIC ICA/CCA RATIO:  1.1 ECA:  62 cm/sec RIGHT CAROTID ARTERY: Unchanged mild heterogeneous atherosclerotic plaque in the proximal internal carotid artery. By peak systolic velocity criteria, the estimated stenosis remains less than 50%. RIGHT VERTEBRAL ARTERY:  Patent with normal antegrade flow. LEFT CAROTID ARTERY: Unchanged mild heterogeneous atherosclerotic plaque in the proximal internal carotid artery. By peak systolic velocity criteria, the estimated stenosis remains less than 50%. LEFT VERTEBRAL ARTERY:  Patent with normal antegrade flow. IMPRESSION: 1. No significant change or interval progression of bilateral mild carotid artery disease compared to 09/07/2018. 2. Mild (1-49%) stenosis proximal right  internal carotid artery secondary to mild heterogeneous atherosclerotic plaque. 3. Mild (1-49%) stenosis proximal left internal carotid artery secondary to mild heterogeneous atherosclerotic plaque. 4. Vertebral arteries are patent with normal antegrade flow. Electronically Signed   By: Malachy Moan M.D.   On: 12/19/2018 08:41   Mr Maxine Glenn Head Wo Contrast  Result Date: 12/19/2018 CLINICAL DATA:  Intermittent left upper extremity numbness beginning yesterday at 5:00 p.m. EXAM: MRI HEAD WITHOUT CONTRAST MRA HEAD WITHOUT CONTRAST TECHNIQUE: Multiplanar, multiecho pulse sequences of the brain and surrounding structures were obtained without intravenous contrast. Angiographic images of the head were obtained using MRA technique without contrast. COMPARISON:  CT head without contrast 12/18/2018. FINDINGS: MRI HEAD FINDINGS Brain: A punctate focus of restricted diffusion is present in the postcentral gyrus on image 42 of series 6. No other acute or subacute infarct is present. A remote left parietal infarct is present. Extensive confluent periventricular white matter changes are noted bilaterally. Remote lacunar infarct is present in the left caudate head. Remote lacunar infarct is again noted in the posterior right lentiform nucleus. A remote lacunar infarct is present in the left thalamus. Brainstem and cerebellum  are within normal limits. The internal auditory canals are within normal limits. Vascular: Flow is present in the major intracranial arteries. Skull and upper cervical spine: The craniocervical junction is normal. Upper cervical spine is within normal limits. Marrow signal is unremarkable. Sinuses/Orbits: A posterior polyp or mucous retention cyst is again noted in the left maxillary sinus. Mild mucosal thickening is present in the anterior ethmoid air cells. There is some fluid in the inferior mastoid air cells bilaterally. No obstructing nasopharyngeal lesion is present. Other: MRA HEAD FINDINGS Mild  atherosclerotic changes are present within the cavernous internal carotid arteries bilaterally without a significant stenosis. The A1 and M1 segments are within normal limits. Anterior communicating artery is patent. MCA bifurcations are intact. There is mild irregularity of distal MCA branch vessels bilaterally without a significant proximal stenosis or occlusion. ACA branch vessels are within normal limits. Vertebrobasilar junction is normal. PICA origins are not imaged. Prominent AICA vessels are normal bilaterally. The basilar artery is normal. Both posterior cerebral arteries originate from basilar tip. PCA branch vessels are within normal limits. IMPRESSION: 1. Punctate nonhemorrhagic acute/subacute infarct involving the right postcentral gyrus corresponds with the patient's left upper extremity numbness. 2. No other acute intracranial abnormality. 3. Remote left parietal lobe infarct and encephalomalacia. 4. Mild atrophy with advanced confluent periventricular white matter disease. This likely reflects the sequela of chronic microvascular ischemia. 5. Stable remote lacunar infarcts of the basal ganglia bilaterally. 6. Normal variant MRA circle-of-Willis without significant proximal stenosis, aneurysm, or branch vessel occlusion. 7. Mild atherosclerotic changes within the cavernous internal carotid arteries bilaterally without a significant stenosis. Electronically Signed   By: Marin Robertshristopher  Mattern M.D.   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  He has been anticoagulated.  He has a ILR in place.  He had some left arm weakness and numbness and found to have a right sided nonhemorrhagic acute/subacute infarct in the right  post central gyrus consistent with his neurologic findings on his left body.  We will proceed with transesophageal echo today to further evaluate  possible cardiac source of emboli.  Carotid Doppler showed less than 50% stenosis bilaterally.  Signed, Darlin PriestlyKenneth A. Akirra Lacerda MD 12/21/2018, 7:39 AM  Pager: [phone]

## 2018-12-21 NOTE — Progress Notes (Signed)
Patient discharged home per MD order. All discharge instructions given and all questions answered. 

## 2018-12-21 NOTE — Procedures (Signed)
   TRANSESOPHAGEAL ECHOCARDIOGRAM   NAME:  Harold Sandoval   MRN: [id number] DOB:  November 12, 1941   ADMIT DATE: 12/19/2018  INDICATIONS:   PROCEDURE:   Informed consent was obtained prior to the procedure. The risks, benefits and alternatives for the procedure were discussed and the patient comprehended these risks.  Risks include, but are not limited to, cough, sore throat, vomiting, nausea, somnolence, esophageal and stomach trauma or perforation, bleeding, low blood pressure, aspiration, pneumonia, infection, trauma to the teeth and death.    After a procedural time-out, the patient was given 3 mg versed and 75 mcg fentanyl to achieve moderate sedation for 10 min.  The oropharynx was anesthetized 3 cc of topical 1% viscous lidocaine.  The transesophageal probe was inserted in the esophagus and stomach without difficulty and multiple views were obtained. I was present for the entire procedure.    COMPLICATIONS:    There were no immediate complications.  FINDINGS:  LEFT VENTRICLE: EF = 55%. No regional wall motion abnormalities.  RIGHT VENTRICLE: Normal size and function.   LEFT ATRIUM: Normal with  No thrombi.  LEFT ATRIAL APPENDAGE: No thrombus. Good doppler flow  RIGHT ATRIUM: Normal  AORTIC VALVE:  Trileaflet. No thrombi or vegetations. 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 Trivial ai  MITRAL VALVE:    Normal. Trivial to mild mr. No vegetations or thrombi.   TRICUSPID VALVE: Normal. No vegetations or thrombi. Trivial to mild tr  PULMONIC VALVE: Grossly normal.  INTERATRIAL SEPTUM: No PFO or ASD. Agitated saline contrast was used.   PERICARDIUM: No effusion  DESCENDING AORTA: Normal   CONCLUSION: No evidence of cardiac source of emboli. 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 No right to left shunt.

## 2018-12-21 NOTE — Plan of Care (Signed)
  Problem: Education: Goal: Knowledge of General Education information will improve Description Including pain rating scale, medication(s)/side effects and non-pharmacologic comfort measures Outcome: Progressing   Problem: Education: Goal: Knowledge of disease or condition will improve Outcome: Progressing Goal: Knowledge of secondary prevention will improve Outcome: Progressing Goal: Knowledge of patient specific risk factors addressed and post discharge goals established will improve Outcome: Progressing   Problem: Coping: Goal: Will identify appropriate support needs Outcome: Progressing   Problem: Health Behavior/Discharge Planning: Goal: Ability to manage health-related needs will improve Outcome: Progressing   Problem: Self-Care: Goal: Ability to communicate needs accurately will improve Outcome: Progressing   Problem: Nutrition: Goal: Risk of aspiration will decrease Outcome: Progressing   Problem: Ischemic Stroke/TIA Tissue Perfusion: Goal: Complications of ischemic stroke/TIA will be minimized Outcome: Progressing   

## 2018-12-21 NOTE — Discharge Summary (Signed)
Harold Sandoval, is a 78 y.o. male  DOB 1941-07-14  MRN [id number].  Admission date:  12/19/2018  Admitting Physician  Arnaldo NatalMichael S Diamond, MD  Discharge Date:  12/21/2018   Primary MD  Gracelyn NurseJohnston, [name], MD  Recommendations for primary care physician for things to follow: 9 Follow-up with PCP in 1 week Follow-up with Dr. Malvin JohnsPotter as scheduled.   Blood pressure 121/74, pulse 81, temperature 98 F (36.7 C), temperature source Oral, resp. rate 17, height 6\' 4"  (1.93 m), weight 87.3 kg, SpO2 96 %.     Admission Diagnosis  TIA (transient ischemic attack) [G45.9] Numbness [R20.0]   Discharge Diagnosis  TIA (transient ischemic attack) [G45.9] Numbness [R20.0]    Active Problems:   TIA (transient ischemic attack)      Past Medical History:  Diagnosis Date  . Arthritis    "all over" (02/10/2018)  . Balance problem    when first stands.  Since stroke.  . Constipation   . 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 Degenerative joint disease (DJD) of hip   . Diabetic neuropathy (HCC)   . Diabetic neuropathy (HCC)    fingers  . 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 Glaucoma, both eyes   . High cholesterol   . History of gout   . 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 Macular degeneration, left eye   . Sinus headache    resolved  . Type II diabetes mellitus (HCC)     Past Surgical History:  Procedure Laterality Date  . APPENDECTOMY    . BACK SURGERY    . CATARACT EXTRACTION, BILATERAL    . COLONOSCOPY WITH PROPOFOL N/A 12/29/2016   Procedure: COLONOSCOPY WITH PROPOFOL;  Surgeon: Midge Miniumarren Wohl, MD;  [facility];  Service: Endoscopy;  Laterality: N/A;  Diabetic - oral meds  .  JOINT REPLACEMENT    . LAPAROSCOPIC CHOLECYSTECTOMY    . LOOP RECORDER INSERTION N/A 02/24/2017   Procedure: Loop Recorder Insertion;  Surgeon: Marcina MillardAlexander Paraschos, MD;  Location: [facility];  Service: Cardiovascular;  Laterality: N/A;  . LUMBAR DISC SURGERY    . TOTAL HIP ARTHROPLASTY Left 02/10/2018   Procedure: TOTAL HIP ARTHROPLASTY ANTERIOR APPROACH;  Surgeon: Gean Birchwoodowan, Frank, MD;  Location: [facility] OR;  Service: Orthopedics;  Laterality: Left;  . TOTAL KNEE ARTHROPLASTY Left 08/11/2018   Procedure: LEFT TOTAL KNEE ARTHROPLASTY;  Surgeon: Gean Birchwoodowan, Frank, MD;  Location: [facility];  Service: Orthopedics;  Laterality: Left;       History of present illness and  Hospital Course:     Kindly see H&P for history of present illness and admission details, please review complete Labs, Consult reports and Test reports for all details in brief  HPI  from the history and physical done on the day of admission 78 year old male patient with history of diabetes mellitus type 2, hyperlipidemia, hypertension found to have left hand numbness, admitted for evaluation of the TIA/CVA.   Hospital Course  #1 recurrent strokes, patient had left hand numbness, weakness and found to have acute infarct in right posterior central gyrus.  Patient had 2 infarcts in 4 months, seen by Dr. Thad Rangereynolds from neurology, recommends TEE, seen by Dr. Lady GaryFath, TEE did not show any cardiac source of emboli.  Carotid ultrasound did not show any hemodynamically significant stenosis patient also has LINQ monitor.  He can continue Eliquis,  statins, aspirin added on this admission..  Seen by physical therapy, did not have any physical therapy needs. #2 diabetes mellitus type 2: Continue home dose insulin. 3.  Paroxysmal atrial fibrillation, patient is on Eliquis.   Discharge Condition: Stable   Follow UP  Follow-up Information    Gracelyn NurseJohnston, [name], MD. Schedule an appointment as soon as possible for a visit in 1 week(s).   Specialty:   Internal Medicine Contact information: [address] [place] [address] [phone]        Dalia HeadingFath, Kenneth A, MD. Schedule an appointment as soon as possible for a visit in 1 week(s).   Specialty:  Cardiology Contact information: [address] [place] [address] [phone]             Discharge Instructions  and  Discharge Medications      Allergies as of 12/21/2018   No Known Allergies     Medication List    STOP taking these medications   oxyCODONE-acetaminophen 5-325 MG tablet Commonly known as:  PERCOCET/ROXICET   senna 8.6 MG tablet Commonly known as:  SENOKOT     TAKE these medications   apixaban 5 MG Tabs tablet Commonly known as:  ELIQUIS Take 1 tablet (5 mg total) by mouth 2 (two) times daily.   aspirin EC 81 MG tablet Take 1 tablet (81 mg total) by mouth daily.   atorvastatin 80 MG tablet Commonly known as:  LIPITOR Take 80 mg by mouth every evening.   brimonidine 0.2 % ophthalmic solution Commonly known as:  ALPHAGAN Place 1 drop into both eyes 2 (two) times daily.   brinzolamide 1 % ophthalmic suspension Commonly known as:  AZOPT Place 1 drop into both eyes 2 (two) times daily.   desonide 0.05 % lotion Commonly known as:  DESOWEN Apply 1 application topically 2 (two) times daily.   fluticasone 50 MCG/ACT nasal spray Commonly known as:  FLONASE Place 2 sprays into both nostrils daily as needed for rhinitis.   latanoprost 0.005 % ophthalmic solution Commonly known as:  XALATAN Place 1 drop into both eyes at bedtime.   linaclotide 290 MCG Caps capsule Commonly known as:  LINZESS Take 290 mcg by mouth daily.   metFORMIN 500 MG tablet Commonly known as:  GLUCOPHAGE Take 500 mg by mouth 2 (two) times daily with a meal.   tiZANidine 2 MG tablet Commonly known as:  ZANAFLEX Take 1 tablet (2 mg total) by mouth every 6 (six) hours as needed.   traMADol 50 MG tablet Commonly known as:  ULTRAM Take 50 mg by  mouth every 4 (four) hours as needed for pain.   Vitamin D3 50 MCG (2000 UT) capsule Take 2,000 Units by mouth daily.         Diet and Activity recommendation: See Discharge Instructions above   Consults obtained -.  Cardiology, neurology. Major procedures and Radiology Reports - PLEASE review detailed and final reports for all details, in brief -     Dg Chest 1 View  Result Date: 12/18/2018 CLINICAL DATA:  Left arm numbness since around 1700 hours. Previous history of stroke. EXAM: CHEST  1 VIEW COMPARISON:  02/03/2018 FINDINGS: Hyperinflation suggesting emphysema. Normal heart size and pulmonary vascularity. No focal airspace disease or consolidation in the lungs. No blunting of costophrenic angles. No pneumothorax. Mediastinal contours appear intact. Loop recorder is present. Calcification of the aorta. Degenerative changes in the spine and shoulders. IMPRESSION: Emphysematous changes in the lungs. No evidence of active  pulmonary disease. Electronically Signed   By: Burman Nieves M.D.   On: 12/18/2018 21:33   Ct Head Wo Contrast  Result Date: 12/18/2018 CLINICAL DATA:  Acute onset of LEFT UPPER EXTREMITY numbness that began around 5 o'clock p.m. this afternoon. Personal history of stroke. EXAM: CT HEAD WITHOUT CONTRAST TECHNIQUE: Contiguous axial images were obtained from the base of the skull through the vertex without intravenous contrast. COMPARISON:  MRI brain 09/07/2018. CT head 09/06/2018. FINDINGS: Brain: Ventricular system normal in size and appearance for age. Remote LEFT parietal cortical stroke with encephalomalacia, unchanged. Remote lacunar stroke in the LEFT basal ganglia, unchanged. Moderate changes of small vessel disease of the white matter diffusely, unchanged. No mass lesion. No midline shift. No acute hemorrhage or hematoma. No extra-axial fluid collections. No evidence of acute infarction. Vascular: Severe BILATERAL carotid siphon and vertebral artery  atherosclerosis. No hyperdense vessel. Skull: No skull fracture or other focal osseous abnormality involving the skull. Sinuses/Orbits: Visualized paranasal sinuses, bilateral mastoid air cells and bilateral middle ear cavities well-aerated. Visualized orbits and globes normal in appearance. Other: None. IMPRESSION: 1. No acute intracranial abnormality. 2. Stable old LEFT parietal cortical stroke with encephalomalacia and old LEFT basal ganglia lacunar stroke. 3. Stable moderate chronic microvascular ischemic changes of the white matter. Electronically Signed   By: Hulan Saas M.D.   On: 12/18/2018 21:39   Mr Brain Wo Contrast  Result Date: 12/19/2018 CLINICAL DATA:  Intermittent left upper extremity numbness beginning yesterday at 5:00 p.m. EXAM: MRI HEAD WITHOUT CONTRAST MRA HEAD WITHOUT CONTRAST TECHNIQUE: Multiplanar, multiecho pulse sequences of the brain and surrounding structures were obtained without intravenous contrast. Angiographic images of the head were obtained using MRA technique without contrast. COMPARISON:  CT head without contrast 12/18/2018. FINDINGS: MRI HEAD FINDINGS Brain: A punctate focus of restricted diffusion is present in the postcentral gyrus on image 42 of series 6. No other acute or subacute infarct is present. A remote left parietal infarct is present. Extensive confluent periventricular white matter changes are noted bilaterally. Remote lacunar infarct is present in the left caudate head. Remote lacunar infarct is again noted in the posterior right lentiform nucleus. A remote lacunar infarct is present in the left thalamus. Brainstem and cerebellum are within normal limits. The internal auditory canals are within normal limits. Vascular: Flow is present in the major intracranial arteries. Skull and upper cervical spine: The craniocervical junction is normal. Upper cervical spine is within normal limits. Marrow signal is unremarkable. Sinuses/Orbits: A posterior polyp or  mucous retention cyst is again noted in the left maxillary sinus. Mild mucosal thickening is present in the anterior ethmoid air cells. There is some fluid in the inferior mastoid air cells bilaterally. No obstructing nasopharyngeal lesion is present. Other: MRA HEAD FINDINGS Mild atherosclerotic changes are present within the cavernous internal carotid arteries bilaterally without a significant stenosis. The A1 and M1 segments are within normal limits. Anterior communicating artery is patent. MCA bifurcations are intact. There is mild irregularity of distal MCA branch vessels bilaterally without a significant proximal stenosis or occlusion. ACA branch vessels are within normal limits. Vertebrobasilar junction is normal. PICA origins are not imaged. Prominent AICA vessels are normal bilaterally. The basilar artery is normal. Both posterior cerebral arteries originate from basilar tip. PCA branch vessels are within normal limits. IMPRESSION: 1. Punctate nonhemorrhagic acute/subacute infarct involving the right postcentral gyrus corresponds with the patient's left upper extremity numbness. 2. No other acute intracranial abnormality. 3. Remote left parietal lobe infarct and encephalomalacia.  4. Mild atrophy with advanced confluent periventricular white matter disease. This likely reflects the sequela of chronic microvascular ischemia. 5. Stable remote lacunar infarcts of the basal ganglia bilaterally. 6. Normal variant MRA circle-of-Willis without significant proximal stenosis, aneurysm, or branch vessel occlusion. 7. Mild atherosclerotic changes within the cavernous internal carotid arteries bilaterally without a significant stenosis. Electronically Signed   By: Marin Roberts M.D.   On: 12/19/2018 08:32   US Carotid Bilateral (at [facility] Only)  Result Date: 12/19/2018 CLINICAL DATA:  78 year old male with acute punctate right cortical infarct. EXAM: BILATERAL CAROTID DUPLEX ULTRASOUND TECHNIQUE: Wallace Cullens  scale imaging, color Doppler and duplex ultrasound were performed of bilateral carotid and vertebral arteries in the neck. COMPARISON:  Relatively recent prior carotid duplex ultrasound 09/07/2018 FINDINGS: Criteria: Quantification of carotid stenosis is based on velocity parameters that correlate the residual internal carotid diameter with NASCET-based stenosis levels, using the diameter of the distal internal carotid lumen as the denominator for stenosis measurement. The following velocity measurements were obtained: RIGHT ICA: 79/28 cm/sec CCA: 94/18 cm/sec SYSTOLIC ICA/CCA RATIO:  0.8 ECA:  77 cm/sec LEFT ICA: 82/28 cm/sec CCA: 75/21 cm/sec SYSTOLIC ICA/CCA RATIO:  1.1 ECA:  62 cm/sec RIGHT CAROTID ARTERY: Unchanged mild heterogeneous atherosclerotic plaque in the proximal internal carotid artery. By peak systolic velocity criteria, the estimated stenosis remains less than 50%. RIGHT VERTEBRAL ARTERY:  Patent with normal antegrade flow. LEFT CAROTID ARTERY: Unchanged mild heterogeneous atherosclerotic plaque in the proximal internal carotid artery. By peak systolic velocity criteria, the estimated stenosis remains less than 50%. LEFT VERTEBRAL ARTERY:  Patent with normal antegrade flow. IMPRESSION: 1. No significant change or interval progression of bilateral mild carotid artery disease compared to 09/07/2018. 2. Mild (1-49%) stenosis proximal right internal carotid artery secondary to mild heterogeneous atherosclerotic plaque. 3. Mild (1-49%) stenosis proximal left internal carotid artery secondary to mild heterogeneous atherosclerotic plaque. 4. Vertebral arteries are patent with normal antegrade flow. Electronically Signed   By: Malachy Moan M.D.   On: 12/19/2018 08:41   Mr Maxine Glenn Head Wo Contrast  Result Date: 12/19/2018 CLINICAL DATA:  Intermittent left upper extremity numbness beginning yesterday at 5:00 p.m. EXAM: MRI HEAD WITHOUT CONTRAST MRA HEAD WITHOUT CONTRAST TECHNIQUE: Multiplanar, multiecho  pulse sequences of the brain and surrounding structures were obtained without intravenous contrast. Angiographic images of the head were obtained using MRA technique without contrast. COMPARISON:  CT head without contrast 12/18/2018. FINDINGS: MRI HEAD FINDINGS Brain: A punctate focus of restricted diffusion is present in the postcentral gyrus on image 42 of series 6. No other acute or subacute infarct is present. A remote left parietal infarct is present. Extensive confluent periventricular white matter changes are noted bilaterally. Remote lacunar infarct is present in the left caudate head. Remote lacunar infarct is again noted in the posterior right lentiform nucleus. A remote lacunar infarct is present in the left thalamus. Brainstem and cerebellum are within normal limits. The internal auditory canals are within normal limits. Vascular: Flow is present in the major intracranial arteries. Skull and upper cervical spine: The craniocervical junction is normal. Upper cervical spine is within normal limits. Marrow signal is unremarkable. Sinuses/Orbits: A posterior polyp or mucous retention cyst is again noted in the left maxillary sinus. Mild mucosal thickening is present in the anterior ethmoid air cells. There is some fluid in the inferior mastoid air cells bilaterally. No obstructing nasopharyngeal lesion is present. Other: MRA HEAD FINDINGS Mild atherosclerotic changes are present within the cavernous internal carotid arteries bilaterally  without a significant stenosis. The A1 and M1 segments are within normal limits. Anterior communicating artery is patent. MCA bifurcations are intact. There is mild irregularity of distal MCA branch vessels bilaterally without a significant proximal stenosis or occlusion. ACA branch vessels are within normal limits. Vertebrobasilar junction is normal. PICA origins are not imaged. Prominent AICA vessels are normal bilaterally. The basilar artery is normal. Both posterior  cerebral arteries originate from basilar tip. PCA branch vessels are within normal limits. IMPRESSION: 1. Punctate nonhemorrhagic acute/subacute infarct involving the right postcentral gyrus corresponds with the patient's left upper extremity numbness. 2. No other acute intracranial abnormality. 3. Remote left parietal lobe infarct and encephalomalacia. 4. Mild atrophy with advanced confluent periventricular white matter disease. This likely reflects the sequela of chronic microvascular ischemia. 5. Stable remote lacunar infarcts of the basal ganglia bilaterally. 6. Normal variant MRA circle-of-Willis without significant proximal stenosis, aneurysm, or branch vessel occlusion. 7. Mild atherosclerotic changes within the cavernous internal carotid arteries bilaterally without a significant stenosis. Electronically Signed   By: Marin Roberts M.D.   On: 12/19/2018 08:32    Micro Results     No results found for this or any previous visit (from the past 240 hour(s)).     Today   Subjective:   Harold Sandoval today has no headache,no chest abdominal pain,no new weakness tingling or numbness, feels much better wants to go home today.   Objective:   Blood pressure 121/74, pulse 81, temperature 98 F (36.7 C), temperature source Oral, resp. rate 17, height 6\' 4"  (1.93 m), weight 87.3 kg, SpO2 96 %.   Intake/Output Summary (Last 24 hours) at 12/21/2018 1236 Last data filed at 12/20/2018 1848 Gross per 24 hour  Intake 240 ml  Output -  Net 240 ml    Exam Awake Alert, Oriented x 3, No new F.N deficits, Normal affect [place].AT,PERRAL Supple Neck,No JVD, No cervical lymphadenopathy appriciated.  Symmetrical Chest wall movement, Good air movement bilaterally, CTAB RRR,No Gallops,Rubs or new Murmurs, No Parasternal Heave +ve B.Sounds, Abd Soft, Non tender, No organomegaly appriciated, No rebound -guarding or rigidity. No Cyanosis, Clubbing or edema, No new Rash or bruise  Data Review   CBC w  Diff:  Lab Results  Component Value Date   WBC 5.1 12/21/2018   HGB 15.5 12/21/2018   HCT 46.5 12/21/2018   PLT 209 12/21/2018   LYMPHOPCT 40 12/18/2018   MONOPCT 6 12/18/2018   EOSPCT 3 12/18/2018   BASOPCT 1 12/18/2018    CMP:  Lab Results  Component Value Date   NA 142 12/18/2018   K 4.1 12/18/2018   CL 104 12/18/2018   CO2 30 12/18/2018   BUN 14 12/18/2018   CREATININE 0.72 12/21/2018   PROT 6.9 09/06/2018   ALBUMIN 4.6 09/06/2018   BILITOT 1.6 (H) 09/06/2018   ALKPHOS 135 (H) 09/06/2018   AST 17 09/06/2018   ALT 16 09/06/2018  .   Total Time in preparing paper work, data evaluation and todays exam - 35 minutes  Katha Hamming M.D on 12/21/2018 at 12:36 PM    Note: This dictation was prepared with Dragon dictation along with smaller phrase technology. Any transcriptional errors that result from this process are unintentional.

## 2018-12-22 ENCOUNTER — Encounter: Payer: Self-pay | Admitting: Cardiology

## 2019-01-11 IMAGING — RF DG HIP (WITH PELVIS) OPERATIVE*L*
1 series · 2 of 2 positions shown · non-contrast
Comparison: None.

CLINICAL DATA: Total left hip arthroplasty from anterior approach.

EXAM:
DG C-ARM 61-120 MIN; OPERATIVE LEFT HIP WITH PELVIS

[Series 1: run · 2 of 2 slices shown]
[im 1/2]
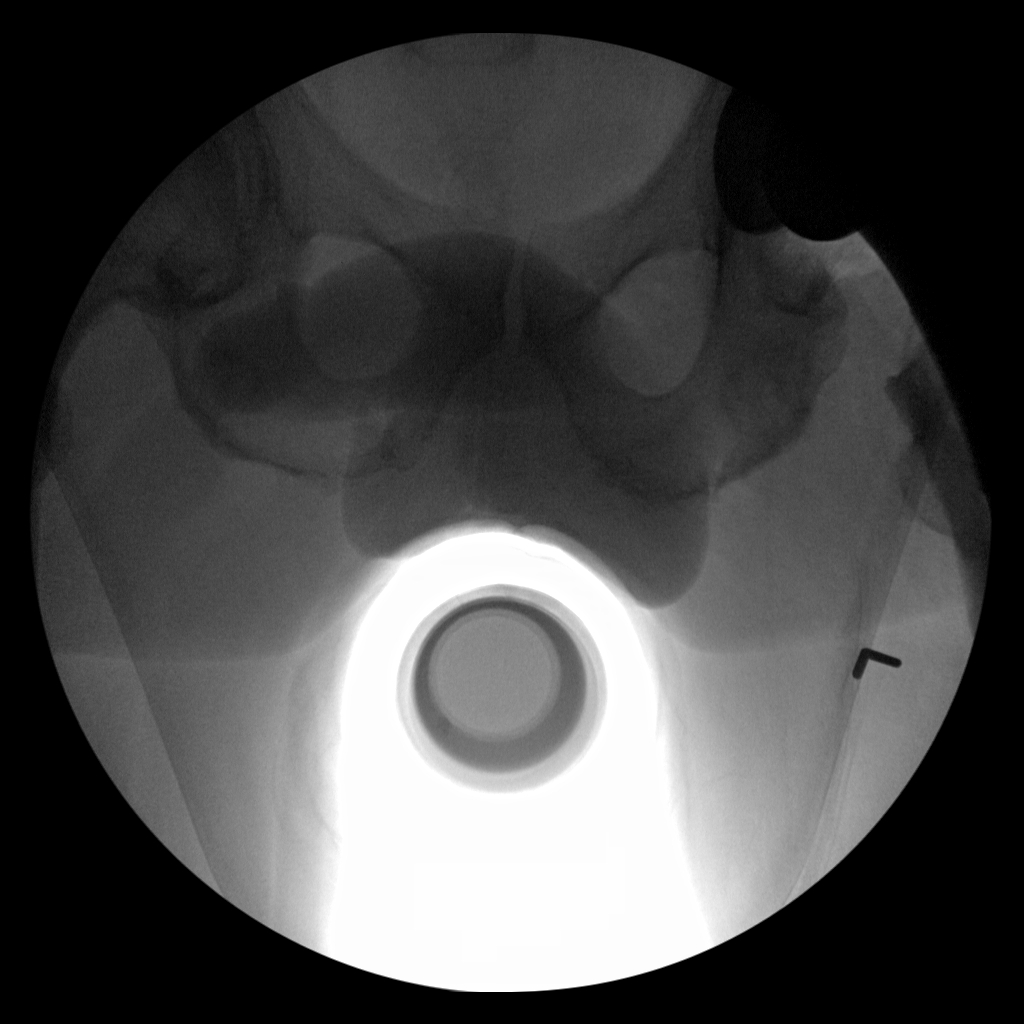
[im 2/2]
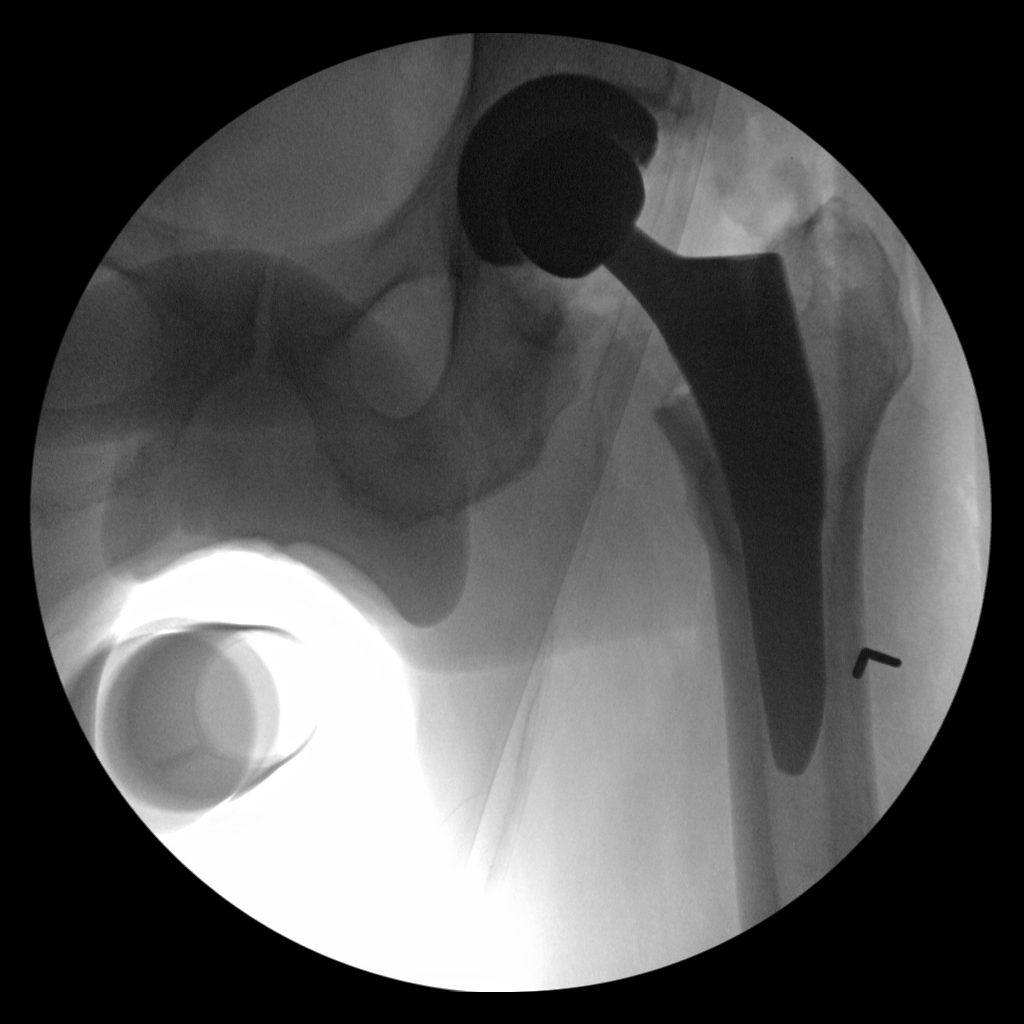

[2 of 2 positions shown; findings below may reference images not displayed]

FINDINGS: 41 seconds of fluoroscopic time was utilized during placement of an
uncemented left total hip arthroplasty. No immediate intraoperative
complications are recognized. Intraoperative soft tissue emphysema
about the left hip is noted.
IMPRESSION: Fluoroscopic time utilized for left total uncemented hip
arthroplasty.

## 2019-02-26 ENCOUNTER — Emergency Department: Payer: Medicare HMO

## 2019-02-26 ENCOUNTER — Other Ambulatory Visit: Payer: Self-pay

## 2019-02-26 ENCOUNTER — Observation Stay: Payer: Medicare HMO

## 2019-02-26 ENCOUNTER — Observation Stay
Admit: 2019-02-26 | Discharge: 2019-02-26 | Disposition: A | Discharge status: Home / Self Care | Payer: Medicare HMO | Attending: Internal Medicine | Admitting: Internal Medicine

## 2019-02-26 ENCOUNTER — Observation Stay
Admission: EM | Admit: 2019-02-26 | Discharge: 2019-02-27 | Disposition: A | Discharge status: Home / Self Care | Payer: Medicare HMO | Attending: Internal Medicine | Admitting: Internal Medicine

## 2019-02-26 ENCOUNTER — Encounter: Payer: Self-pay | Admitting: Emergency Medicine

## 2019-02-26 DIAGNOSIS — M109 Gout, unspecified: Secondary | ICD-10-CM | POA: Diagnosis not present

## 2019-02-26 DIAGNOSIS — Z7984 Long term (current) use of oral hypoglycemic drugs: Secondary | ICD-10-CM | POA: Insufficient documentation

## 2019-02-26 DIAGNOSIS — E785 Hyperlipidemia, unspecified: Secondary | ICD-10-CM | POA: Diagnosis not present

## 2019-02-26 DIAGNOSIS — Z7901 Long term (current) use of anticoagulants: Secondary | ICD-10-CM | POA: Insufficient documentation

## 2019-02-26 DIAGNOSIS — Z79899 Other long term (current) drug therapy: Secondary | ICD-10-CM | POA: Diagnosis not present

## 2019-02-26 DIAGNOSIS — Z7982 Long term (current) use of aspirin: Secondary | ICD-10-CM | POA: Diagnosis not present

## 2019-02-26 DIAGNOSIS — H409 Unspecified glaucoma: Secondary | ICD-10-CM | POA: Diagnosis not present

## 2019-02-26 DIAGNOSIS — M1612 Unilateral primary osteoarthritis, left hip: Secondary | ICD-10-CM | POA: Diagnosis not present

## 2019-02-26 DIAGNOSIS — M1712 Unilateral primary osteoarthritis, left knee: Secondary | ICD-10-CM | POA: Diagnosis not present

## 2019-02-26 DIAGNOSIS — E7800 Pure hypercholesterolemia, unspecified: Secondary | ICD-10-CM | POA: Insufficient documentation

## 2019-02-26 DIAGNOSIS — Z7951 Long term (current) use of inhaled steroids: Secondary | ICD-10-CM | POA: Insufficient documentation

## 2019-02-26 DIAGNOSIS — I491 Atrial premature depolarization: Secondary | ICD-10-CM | POA: Insufficient documentation

## 2019-02-26 DIAGNOSIS — G459 Transient cerebral ischemic attack, unspecified: Principal | ICD-10-CM | POA: Insufficient documentation

## 2019-02-26 DIAGNOSIS — H3530 Unspecified macular degeneration: Secondary | ICD-10-CM | POA: Insufficient documentation

## 2019-02-26 DIAGNOSIS — E1140 Type 2 diabetes mellitus with diabetic neuropathy, unspecified: Secondary | ICD-10-CM | POA: Diagnosis not present

## 2019-02-26 LAB — CBC
HCT: 44.3 % (ref 39.0–52.0)
Hemoglobin: 14.5 g/dL (ref 13.0–17.0)
MCH: 30.5 pg (ref 26.0–34.0)
MCHC: 32.7 g/dL (ref 30.0–36.0)
MCV: 93.3 fL (ref 80.0–100.0)
Platelets: 167 10*3/uL (ref 150–400)
RBC: 4.75 MIL/uL (ref 4.22–5.81)
RDW: 12.9 % (ref 11.5–15.5)
WBC: 7.5 10*3/uL (ref 4.0–10.5)
nRBC: 0 % (ref 0.0–0.2)

## 2019-02-26 LAB — COMPREHENSIVE METABOLIC PANEL WITH GFR
ALT: 18 U/L (ref 0–44)
AST: 20 U/L (ref 15–41)
Albumin: 4.1 g/dL (ref 3.5–5.0)
Alkaline Phosphatase: 79 U/L (ref 38–126)
Anion gap: 8 (ref 5–15)
BUN: 11 mg/dL (ref 8–23)
CO2: 29 mmol/L (ref 22–32)
Calcium: 9.2 mg/dL (ref 8.9–10.3)
Chloride: 102 mmol/L (ref 98–111)
Creatinine, Ser: 0.79 mg/dL (ref 0.61–1.24)
GFR calc Af Amer: >60 mL/min
GFR calc non Af Amer: >60 mL/min
Glucose, Bld: 179 mg/dL — ABNORMAL HIGH (ref 70–99)
Potassium: 3.5 mmol/L (ref 3.5–5.1)
Sodium: 139 mmol/L (ref 135–145)
Total Bilirubin: 1.8 mg/dL — ABNORMAL HIGH (ref 0.3–1.2)
Total Protein: 6.4 g/dL — ABNORMAL LOW (ref 6.5–8.1)

## 2019-02-26 LAB — GLUCOSE, CAPILLARY
Glucose-Capillary: 135 mg/dL — ABNORMAL HIGH (ref 70–99)
Glucose-Capillary: 98 mg/dL (ref 70–99)
Glucose-Capillary: 100 mg/dL — ABNORMAL HIGH (ref 70–99)

## 2019-02-26 LAB — PROTIME-INR
INR: 1.1 (ref 0.8–1.2)
Prothrombin Time: 14.3 s (ref 11.4–15.2)

## 2019-02-26 LAB — APTT: aPTT: 28 s (ref 24–36)

## 2019-02-26 LAB — DIFFERENTIAL
Abs Immature Granulocytes: 0.02 10*3/uL (ref 0.00–0.07)
Basophils Absolute: 0.1 10*3/uL (ref 0.0–0.1)
Basophils Relative: 1 %
Eosinophils Absolute: 0.3 10*3/uL (ref 0.0–0.5)
Eosinophils Relative: 4 %
Immature Granulocytes: 0 %
Lymphocytes Relative: 15 %
Lymphs Abs: 1.1 10*3/uL (ref 0.7–4.0)
Monocytes Absolute: 0.3 10*3/uL (ref 0.1–1.0)
Monocytes Relative: 4 %
Neutro Abs: 5.8 10*3/uL (ref 1.7–7.7)
Neutrophils Relative %: 76 %

## 2019-02-26 MED ORDER — ATORVASTATIN CALCIUM 20 MG PO TABS
80.0000 mg | ORAL_TABLET | Freq: Every evening | ORAL | Status: DC
  Administered 2019-02-26: 80 mg via ORAL
  Filled 2019-02-26: qty 4

## 2019-02-26 MED ORDER — ACETAMINOPHEN 325 MG PO TABS: 650.0000 mg | ORAL_TABLET | ORAL | Status: DC | PRN

## 2019-02-26 MED ORDER — ACETAMINOPHEN 160 MG/5ML PO SOLN
650.0000 mg | ORAL | Status: DC | PRN
  Filled 2019-02-26: qty 20.3

## 2019-02-26 MED ORDER — STROKE: EARLY STAGES OF RECOVERY BOOK
Freq: Once | Status: AC
  Administered 2019-02-26: 18:00:00

## 2019-02-26 MED ORDER — INSULIN ASPART 100 UNIT/ML ~~LOC~~ SOLN: 0.0000 [IU] | Freq: Three times a day (TID) | SUBCUTANEOUS | Status: DC

## 2019-02-26 MED ORDER — GABAPENTIN 100 MG PO CAPS
200.0000 mg | ORAL_CAPSULE | Freq: Two times a day (BID) | ORAL | Status: DC
  Administered 2019-02-26 – 2019-02-27 (×2): 200 mg via ORAL
  Filled 2019-02-26 (×2): qty 2

## 2019-02-26 MED ORDER — LATANOPROST 0.005 % OP SOLN
1.0000 [drp] | Freq: Every day | OPHTHALMIC | Status: DC
  Administered 2019-02-26: 21:00:00 1 [drp] via OPHTHALMIC
  Filled 2019-02-26: qty 2.5

## 2019-02-26 MED ORDER — APIXABAN 5 MG PO TABS
5.0000 mg | ORAL_TABLET | Freq: Two times a day (BID) | ORAL | Status: DC
  Administered 2019-02-26 – 2019-02-27 (×2): 5 mg via ORAL
  Filled 2019-02-26 (×2): qty 1

## 2019-02-26 MED ORDER — BRINZOLAMIDE 1 % OP SUSP
1.0000 [drp] | Freq: Two times a day (BID) | OPHTHALMIC | Status: DC
  Administered 2019-02-26 – 2019-02-27 (×2): 1 [drp] via OPHTHALMIC
  Filled 2019-02-26: qty 10

## 2019-02-26 MED ORDER — VITAMIN D 25 MCG (1000 UNIT) PO TABS
2000.0000 [IU] | ORAL_TABLET | Freq: Every day | ORAL | Status: DC
  Administered 2019-02-27: 2000 [IU] via ORAL
  Filled 2019-02-26: qty 2

## 2019-02-26 MED ORDER — METFORMIN HCL 500 MG PO TABS
500.0000 mg | ORAL_TABLET | Freq: Two times a day (BID) | ORAL | Status: DC
  Filled 2019-02-26: qty 1

## 2019-02-26 MED ORDER — FLUTICASONE PROPIONATE 50 MCG/ACT NA SUSP
2.0000 | Freq: Every day | NASAL | Status: DC | PRN
  Filled 2019-02-26: qty 16

## 2019-02-26 MED ORDER — TIZANIDINE HCL 2 MG PO TABS
2.0000 mg | ORAL_TABLET | Freq: Four times a day (QID) | ORAL | Status: DC | PRN
  Filled 2019-02-26: qty 1

## 2019-02-26 MED ORDER — BRIMONIDINE TARTRATE 0.2 % OP SOLN
1.0000 [drp] | Freq: Two times a day (BID) | OPHTHALMIC | Status: DC
  Administered 2019-02-26 – 2019-02-27 (×2): 1 [drp] via OPHTHALMIC
  Filled 2019-02-26: qty 5

## 2019-02-26 MED ORDER — LINACLOTIDE 290 MCG PO CAPS
290.0000 ug | ORAL_CAPSULE | Freq: Every day | ORAL | Status: DC
  Administered 2019-02-27: 290 ug via ORAL
  Filled 2019-02-26: qty 1

## 2019-02-26 MED ORDER — TRAMADOL HCL 50 MG PO TABS
50.0000 mg | ORAL_TABLET | Freq: Four times a day (QID) | ORAL | Status: DC | PRN
  Administered 2019-02-26: 50 mg via ORAL
  Filled 2019-02-26: qty 1

## 2019-02-26 MED ORDER — ACETAMINOPHEN 650 MG RE SUPP: 650.0000 mg | RECTAL | Status: DC | PRN

## 2019-02-26 MED ORDER — ASPIRIN EC 81 MG PO TBEC
81.0000 mg | DELAYED_RELEASE_TABLET | Freq: Every day | ORAL | Status: DC
  Administered 2019-02-27: 81 mg via ORAL
  Filled 2019-02-26: qty 1

## 2019-02-26 MED ORDER — SODIUM CHLORIDE 0.9% FLUSH: 3.0000 mL | Freq: Once | INTRAVENOUS | Status: DC

## 2019-02-26 NOTE — ED Notes (Signed)
Admitting MD at bedside.

## 2019-02-26 NOTE — Progress Notes (Signed)
*  PRELIMINARY RESULTS* Echocardiogram 2D Echocardiogram has been performed. A Saline Microcavitation (Bubble Study) was performed on this study per protocol for Stroke indication.  Harold [name] Sandoval 02/26/2019, 3:40 PM

## 2019-02-26 NOTE — ED Notes (Signed)
Light green and blue top sent to lab at this time.

## 2019-02-26 NOTE — ED Provider Notes (Signed)
[facility] Emergency Department Provider Note  ____________________________________________   I have reviewed the triage vital signs and the nursing notes. Where available I have reviewed prior notes and, if possible and indicated, outside hospital notes.    HISTORY  Chief Complaint Weakness    HPI Harold Sandoval is a 78 y.o. male with a history of prior CVAs and most recently in January, MRI at that time showed right postcentral gyrus CVA, patient also had a prior remote left parietal lobe infarct and encephalomalacia from that.  He is on Eliquis and continues to take it as well as aspirin.  He states that this morning he woke up feeling normal and then at 730) he noticed that he was having some tingling and some weakness in the left upper extremity only.  No other focal numbness or weakness.  Lasted for less than 10 minutes and went away but then he thought the tingling might be coming back prior to coming in so he came to the ER.  This is similar to his prior stroke.  However, as he came to the ER his symptoms completely resolved nonetheless he did calm.  He states he does not have any difficulty  seeing or talking or walking, and at this time he has no numbness no weakness and he feels back to baseline.    Past Medical History:  Diagnosis Date  . Arthritis    "all over" (02/10/2018)  . Balance problem    when first stands.  Since stroke.  . Constipation   . 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 Degenerative joint disease (DJD) of hip   . Diabetic neuropathy (HCC)   . Diabetic neuropathy (HCC)    fingers  . 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 Glaucoma, both eyes   . High cholesterol   . History of gout   . 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 Macular degeneration, left eye   . Sinus headache    resolved  . Type II  diabetes mellitus [facility])     Patient Active Problem List   Diagnosis Date Noted  . TIA (transient ischemic attack) 12/19/2018  . Acute CVA (cerebrovascular accident) (HCC) 09/06/2018  . Primary osteoarthritis of left knee 08/11/2018  . Degenerative arthritis of left knee 08/10/2018  . 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 Osteoarthritis of left hip 02/09/2018  . Constipation   . Change in bowel habits   . Chest pain 05/12/2016  . Strain of rectus abdominis muscle 08/01/2015    Past Surgical History:  Procedure Laterality Date  . APPENDECTOMY    . BACK SURGERY    . CATARACT EXTRACTION, BILATERAL    . COLONOSCOPY WITH PROPOFOL N/A 12/29/2016   Procedure: COLONOSCOPY WITH PROPOFOL;  Surgeon: Midge Minium, MD;  Location: [facility];  Service: Endoscopy;  Laterality: N/A;  Diabetic - oral meds  . JOINT REPLACEMENT    . LAPAROSCOPIC CHOLECYSTECTOMY    . LOOP RECORDER INSERTION N/A 02/24/2017   Procedure: Loop Recorder Insertion;  Surgeon: Marcina Millard, MD;  Location: [facility] INVASIVE CV LAB;  Service: Cardiovascular;  Laterality: N/A;  . LUMBAR DISC SURGERY    . TEE WITHOUT CARDIOVERSION N/A 12/21/2018   Procedure: TRANSESOPHAGEAL ECHOCARDIOGRAM (TEE);  Surgeon: Dalia Heading, MD;  Location: [facility] ORS;  Service: Cardiovascular;  Laterality: N/A;  . TOTAL HIP ARTHROPLASTY Left 02/10/2018  Procedure: TOTAL HIP ARTHROPLASTY ANTERIOR APPROACH;  Surgeon: Gean Birchwood, MD;  Location: [facility] OR;  Service: Orthopedics;  Laterality: Left;  . TOTAL KNEE ARTHROPLASTY Left 08/11/2018   Procedure: LEFT TOTAL KNEE ARTHROPLASTY;  Surgeon: Gean Birchwood, MD;  Location: [facility] ORS;  Service: Orthopedics;  Laterality: Left;    Prior to Admission medications   Medication Sig Start Date End Date Taking? Authorizing Provider  apixaban (ELIQUIS) 5 MG TABS tablet Take 1 tablet (5 mg total) by mouth 2 (two) times daily. 09/07/18   Enid Baas, MD  aspirin EC 81 MG tablet Take 1 tablet (81 mg  total) by mouth daily. Patient not taking: Reported on 12/20/2018 09/07/18   Enid Baas, MD  atorvastatin (LIPITOR) 80 MG tablet Take 80 mg by mouth every evening.     [provider]  brimonidine (ALPHAGAN) 0.2 % ophthalmic solution Place 1 drop into both eyes 2 (two) times daily.     [provider]  brinzolamide (AZOPT) 1 % ophthalmic suspension Place 1 drop into both eyes 2 (two) times daily.    [provider]  Cholecalciferol (VITAMIN D3) 2000 units capsule Take 2,000 Units by mouth daily.    [provider]  desonide (DESOWEN) 0.05 % lotion Apply 1 application topically 2 (two) times daily.    [provider]  fluticasone (FLONASE) 50 MCG/ACT nasal spray Place 2 sprays into both nostrils daily as needed for rhinitis.    [provider]  latanoprost (XALATAN) 0.005 % ophthalmic solution Place 1 drop into both eyes at bedtime.     [provider]  linaclotide (LINZESS) 290 MCG CAPS capsule Take 290 mcg by mouth daily. 10/18/18 01/16/19  [provider]  metFORMIN (GLUCOPHAGE) 500 MG tablet Take 500 mg by mouth 2 (two) times daily with a meal. 08/03/18   [provider]  tiZANidine (ZANAFLEX) 2 MG tablet Take 1 tablet (2 mg total) by mouth every 6 (six) hours as needed. Patient not taking: Reported on 12/20/2018 08/11/18   Allena Katz, PA-C  traMADol (ULTRAM) 50 MG tablet Take 50 mg by mouth every 4 (four) hours as needed for pain. 05/28/18   [provider]    Allergies Patient has no known allergies.  Family History  Problem Relation Age of Onset  . Cancer Mother   . Cancer Father     Social History Social History   Tobacco Use  . Smoking status: Never Smoker  . Smokeless tobacco: Never Used  Substance Use Topics  . Alcohol use: No    Alcohol/week: 0.0 standard drinks  . Drug use: No    Review of Systems Constitutional: No fever/chills Eyes: No visual changes. ENT: No sore  throat. No stiff neck no neck pain Cardiovascular: Denies chest pain. Respiratory: Denies shortness of breath. Gastrointestinal:   no vomiting.  No diarrhea.  No constipation. Genitourinary: Negative for dysuria. Musculoskeletal: Negative lower extremity swelling Skin: Negative for rash. Neurological: Negative for severe headaches, focal weakness or numbness.   ____________________________________________   PHYSICAL EXAM:  VITAL SIGNS: ED Triage Vitals  Enc Vitals Group     BP 02/26/19 1046 139/84     Pulse Rate 02/26/19 1046 77     Resp 02/26/19 1046 16     Temp 02/26/19 1046 97.9 F (36.6 C)     Temp Source 02/26/19 1046 Oral     SpO2 02/26/19 1046 99 %     Weight 02/26/19 1053 192 lb 7.4 oz (87.3 kg)  Height --      Head Circumference --      Peak Flow --      Pain Score 02/26/19 1053 0     Pain Loc --      Pain Edu? --      Excl. in GC? --     Constitutional: Alert and oriented. Well appearing and in no acute distress. Eyes: Conjunctivae are normal Head: Atraumatic HEENT: No congestion/rhinnorhea. Mucous membranes are moist.  Oropharynx non-erythematous Neck:   Nontender with no meningismus, no masses, no stridor Cardiovascular: Normal rate, regular rhythm. Grossly normal heart sounds.  Good peripheral circulation. Respiratory: Normal respiratory effort.  No retractions. Lungs CTAB. Abdominal: Soft and nontender. No distention. No guarding no rebound Back:  There is no focal tenderness or step off.  there is no midline tenderness there are no lesions noted. there is no CVA tenderness  Musculoskeletal: No lower extremity tenderness, no upper extremity tenderness. No joint effusions, no DVT signs strong distal pulses no edema Neurologic:   Cranial nerves II through XII are grossly intact 5 out of 5 strength bilateral upper and lower extremity. Finger to nose within normal limits heel to shin within normal limits, speech is normal with no word finding difficulty or  dysarthria, reflexes symmetric, pupils are equally round and reactive to light, there is no pronator drift, sensation is normal, vision is intact to confrontation, gait is deferred, there is no nystagmus, normal neurologic exam NIH stroke scale 0 to my evaluation Skin:  Skin is warm, dry and intact. No rash noted. Psychiatric: Mood and affect are normal. Speech and behavior are normal.  ____________________________________________   LABS (all labs ordered are listed, but only abnormal results are displayed)  Labs Reviewed  GLUCOSE, CAPILLARY - Abnormal; Notable for the following components:      Result Value   Glucose-Capillary 135 (*)    All other components within normal limits  PROTIME-INR  APTT  CBC  DIFFERENTIAL  COMPREHENSIVE METABOLIC PANEL  I-STAT CREATININE, ED  CBG MONITORING, ED    Pertinent labs  results that were available during my care of the patient were reviewed by me and considered in my medical decision making (see chart for details). ____________________________________________  EKG  I personally interpreted any EKGs ordered by me or triage Sinus rhythm, rate 74 bpm, normal axis no acute ST elevation or depression nonspecific ST changes ____________________________________________  RADIOLOGY  Pertinent labs & imaging results that were available during my care of the patient were reviewed by me and considered in my medical decision making (see chart for details). If possible, patient and/or family made aware of any abnormal findings.  No results found. ____________________________________________    PROCEDURES  Procedure(s) performed: None  Procedures  Critical Care performed: None  ____________________________________________   INITIAL IMPRESSION / ASSESSMENT AND PLAN / ED COURSE  Pertinent labs & imaging results that were available during my care of the patient were reviewed by me and considered in my medical decision making (see chart for  details).  Here with left-sided stroke symptoms which have now resolved.  He had 2 episodes today one more significant than the other.  His NIH stroke scale is 0 and he is not a candidate for TPA in any event given his history of blood thinners, which she is actively taking, and his rapidly resolving symptoms.  I did however talk to neurology Dr. Loretha BrasilZeylikman, appreciate consult.  He feels that given patient's stuttering symptoms this morning he might benefit from admission  for observation.  In addition, he does not wish the patient to get TPA at this time.  Agree with this assessment and appreciate the consult.  Also personally spoke to the radiologist reading the film who tells me that there is no acute finding on CT.  Given all this, we will initiate admission and basic blood work etc.     ____________________________________________   FINAL CLINICAL IMPRESSION(S) / ED DIAGNOSES  Final diagnoses:  None      This chart was dictated using voice recognition software.  Despite best efforts to proofread,  errors can occur which can change meaning.      Jeanmarie Plant, MD 02/26/19 [phone]

## 2019-02-26 NOTE — ED Notes (Signed)
EDP at bedside  

## 2019-02-26 NOTE — Progress Notes (Signed)
CODE STROKE- PHARMACY COMMUNICATION   Time CODE STROKE called/page received:10:48  Time response to CODE STROKE was made (in person or via phone): called RN at 10:55  Time Stroke Kit retrieved from [place] (only if needed):N/A  Name of Provider/Nurse contacted:RN Anda Kraft  Past Medical History:  Diagnosis Date  . Arthritis    "all over" (02/10/2018)  . Balance problem    when first stands.  Since stroke.  . Constipation   . Cryptogenic stroke ([place]) 11/24/2015   denies residual on 02/10/2018, left middle cerebral artery s/p TPA  . Degenerative joint disease (DJD) of hip   . Diabetic neuropathy ([place])   . Diabetic neuropathy (HCC)    fingers  . 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 Glaucoma, both eyes   . High cholesterol   . History of gout   . 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 Macular degeneration, left eye   . Sinus headache    resolved  . Type II diabetes mellitus ([place])    Prior to Admission medications   Medication Sig Start Date End Date Taking? Authorizing Provider  apixaban (ELIQUIS) 5 MG TABS tablet Take 1 tablet (5 mg total) by mouth 2 (two) times daily. 09/07/18  Yes Gladstone Lighter, MD  aspirin EC 81 MG tablet Take 1 tablet (81 mg total) by mouth daily. 09/07/18  Yes Gladstone Lighter, MD  atorvastatin (LIPITOR) 80 MG tablet Take 80 mg by mouth every evening.    Yes [provider]  brimonidine (ALPHAGAN) 0.2 % ophthalmic solution Place 1 drop into both eyes 2 (two) times daily.    Yes [provider]  brinzolamide (AZOPT) 1 % ophthalmic suspension Place 1 drop into both eyes 2 (two) times daily.   Yes [provider]  Cholecalciferol (VITAMIN D3) 2000 units capsule Take 2,000 Units by mouth daily.   Yes [provider]  desonide (DESOWEN) 0.05 % lotion Apply 1 application topically 2 (two) times daily.   Yes [provider]  fluticasone (FLONASE) 50 MCG/ACT  nasal spray Place 2 sprays into both nostrils daily as needed for rhinitis.   Yes [provider]  gabapentin (NEURONTIN) 100 MG capsule Take 200 mg by mouth 2 (two) times daily. 02/08/19  Yes [provider]  latanoprost (XALATAN) 0.005 % ophthalmic solution Place 1 drop into both eyes at bedtime.    Yes [provider]  linaclotide (LINZESS) 290 MCG CAPS capsule Take 290 mcg by mouth daily. 10/18/18 02/26/19 Yes [provider]  metFORMIN (GLUCOPHAGE) 500 MG tablet Take 500 mg by mouth 2 (two) times daily with a meal. 08/03/18  Yes [provider]  tiZANidine (ZANAFLEX) 2 MG tablet Take 1 tablet (2 mg total) by mouth every 6 (six) hours as needed. 08/11/18  Yes Leighton Parody, PA-C  traMADol (ULTRAM) 50 MG tablet Take 50 mg by mouth every 4 (four) hours as needed for pain. 05/28/18  Yes [provider]   Alerted RN that patient has Apixaban listed in Med history.  Paulina Fusi, PharmD, BCPS 02/26/2019 11:34 AM

## 2019-02-26 NOTE — Progress Notes (Signed)
Advanced care plan.  Purpose of the Encounter: CODE STATUS  Parties in Attendance: Patient himself  Patient's Decision Capacity: Intact  Subjective/Patient's story: Harold Sandoval  is a 78 y.o. male with a known history of previous CVA, diabetes type 2, diabetic neuropathy, glaucoma, hyperlipidemia, gout, macular degeneration who is presenting with tingling and weakness in the left upper extremity.    Patient had a work-up in the ER CT scan shows no abnormality.  Has had previous CVA and TIAs in the past.  Objective/Medical story  I discussed with the patient regarding his desires for cardiac and pulmonary resuscitation.  Patient states that he would like everything to be done would like to be a full code.  Also discussed with the patient regarding his his plan for living will and healthcare power of attorney.  Patient states that he will be working on this  Goals of care determination:   Full code  CODE STATUS: Full code   Time spent discussing advanced care planning: 16 minutes

## 2019-02-26 NOTE — Care Management Obs Status (Signed)
MEDICARE OBSERVATION STATUS NOTIFICATION   Patient Details  Name: Harold Sandoval MRN: [id number] Date of Birth: 1940-12-09   Medicare Observation Status Notification Given:  Yes    Aneita Kiger A Maudene Stotler, RN 02/26/2019, 1:34 PM

## 2019-02-26 NOTE — ED Notes (Signed)
Patient transported to room 119 

## 2019-02-26 NOTE — ED Notes (Signed)
Attempted to call report, was told nurse had left the floor. Gave name and number and awaiting call back.

## 2019-02-26 NOTE — H&P (Signed)
[facility] - [place] at [facility]   PATIENT NAME: Harold Sandoval    MR#:  [id number]  DATE OF BIRTH:  1941-02-02  DATE OF ADMISSION:  02/26/2019  PRIMARY CARE PHYSICIAN: Gracelyn Nurse, MD   REQUESTING/REFERRING PHYSICIAN: Jeanmarie Plant, MD  CHIEF COMPLAINT:   Chief Complaint  Patient presents with  . Weakness    HISTORY OF PRESENT ILLNESS: Camdin Hegner  is a 78 y.o. male with a known history of previous CVA, diabetes type 2, diabetic neuropathy, glaucoma, hyperlipidemia, gout, macular degeneration who is presenting with tingling and weakness in the left upper extremity.  Symptoms lasted for 10 minutes and went away.  Patient had a CT scan of the head which was negative.  Patient was seen in the emergency room the ED physician discussed the case with on-call neurologist who recommended patient be admitted for observation. Patient currently denying any other symptoms including blurred vision or speech difficulties.    PAST MEDICAL HISTORY:   Past Medical History:  Diagnosis Date  . Arthritis    "all over" (02/10/2018)  . Balance problem    when first stands.  Since stroke.  . Constipation   . 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 Degenerative joint disease (DJD) of hip   . Diabetic neuropathy (HCC)   . Diabetic neuropathy (HCC)    fingers  . 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 Glaucoma, both eyes   . High cholesterol   . History of gout   . 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 Macular degeneration, left eye   . Sinus headache    resolved  . Type II diabetes mellitus (HCC)     PAST SURGICAL HISTORY:  Past Surgical History:  Procedure Laterality Date  . APPENDECTOMY    . BACK SURGERY    . CATARACT EXTRACTION, BILATERAL    . COLONOSCOPY WITH PROPOFOL N/A 12/29/2016   Procedure: COLONOSCOPY WITH PROPOFOL;  Surgeon:  Midge Minium, MD;  Location: [facility];  Service: Endoscopy;  Laterality: N/A;  Diabetic - oral meds  . JOINT REPLACEMENT    . LAPAROSCOPIC CHOLECYSTECTOMY    . LOOP RECORDER INSERTION N/A 02/24/2017   Procedure: Loop Recorder Insertion;  Surgeon: Marcina Millard, MD;  Location: [facility] INVASIVE CV LAB;  Service: Cardiovascular;  Laterality: N/A;  . LUMBAR DISC SURGERY    . TEE WITHOUT CARDIOVERSION N/A 12/21/2018   Procedure: TRANSESOPHAGEAL ECHOCARDIOGRAM (TEE);  Surgeon: Dalia Heading, MD;  Location: [facility] ORS;  Service: Cardiovascular;  Laterality: N/A;  . TOTAL HIP ARTHROPLASTY Left 02/10/2018   Procedure: TOTAL HIP ARTHROPLASTY ANTERIOR APPROACH;  Surgeon: Gean Birchwood, MD;  Location: [facility];  Service: Orthopedics;  Laterality: Left;  . TOTAL KNEE ARTHROPLASTY Left 08/11/2018   Procedure: LEFT TOTAL KNEE ARTHROPLASTY;  Surgeon: Gean Birchwood, MD;  Location: [facility];  Service: Orthopedics;  Laterality: Left;    SOCIAL HISTORY:  Social History   Tobacco Use  . Smoking status: Never Smoker  . Smokeless tobacco: Never Used  Substance Use Topics  . Alcohol use: No    Alcohol/week: 0.0 standard drinks    FAMILY HISTORY:  Family History  Problem Relation Age of Onset  . Cancer Mother   . Cancer Father     DRUG ALLERGIES: No Known Allergies  REVIEW OF SYSTEMS:   CONSTITUTIONAL: No fever, fatigue or weakness.  EYES: No blurred  or double vision.  EARS, NOSE, AND THROAT: No tinnitus or ear pain.  RESPIRATORY: No cough, shortness of breath, wheezing or hemoptysis.  CARDIOVASCULAR: No chest pain, orthopnea, edema.  GASTROINTESTINAL: No nausea, vomiting, diarrhea or abdominal pain.  GENITOURINARY: No dysuria, hematuria.  ENDOCRINE: No polyuria, nocturia,  HEMATOLOGY: No anemia, easy bruising or bleeding SKIN: No rash or lesion. MUSCULOSKELETAL: No joint pain or arthritis.   NEUROLOGIC: No tingling, numbness, weakness.  PSYCHIATRY: No anxiety or depression.   MEDICATIONS AT  HOME:  Prior to Admission medications   Medication Sig Start Date End Date Taking? Authorizing Provider  apixaban (ELIQUIS) 5 MG TABS tablet Take 1 tablet (5 mg total) by mouth 2 (two) times daily. 09/07/18  Yes Enid Baas, MD  aspirin EC 81 MG tablet Take 1 tablet (81 mg total) by mouth daily. 09/07/18  Yes Enid Baas, MD  atorvastatin (LIPITOR) 80 MG tablet Take 80 mg by mouth every evening.    Yes [provider]  brimonidine (ALPHAGAN) 0.2 % ophthalmic solution Place 1 drop into both eyes 2 (two) times daily.    Yes [provider]  brinzolamide (AZOPT) 1 % ophthalmic suspension Place 1 drop into both eyes 2 (two) times daily.   Yes [provider]  Cholecalciferol (VITAMIN D3) 2000 units capsule Take 2,000 Units by mouth daily.   Yes [provider]  desonide (DESOWEN) 0.05 % lotion Apply 1 application topically 2 (two) times daily.   Yes [provider]  fluticasone (FLONASE) 50 MCG/ACT nasal spray Place 2 sprays into both nostrils daily as needed for rhinitis.   Yes [provider]  gabapentin (NEURONTIN) 100 MG capsule Take 200 mg by mouth 2 (two) times daily. 02/08/19  Yes [provider]  latanoprost (XALATAN) 0.005 % ophthalmic solution Place 1 drop into both eyes at bedtime.    Yes [provider]  linaclotide (LINZESS) 290 MCG CAPS capsule Take 290 mcg by mouth daily. 10/18/18 02/26/19 Yes [provider]  metFORMIN (GLUCOPHAGE) 500 MG tablet Take 500 mg by mouth 2 (two) times daily with a meal. 08/03/18  Yes [provider]  tiZANidine (ZANAFLEX) 2 MG tablet Take 1 tablet (2 mg total) by mouth every 6 (six) hours as needed. 08/11/18  Yes Allena Katz, PA-C  traMADol (ULTRAM) 50 MG tablet Take 50 mg by mouth every 4 (four) hours as needed for pain. 05/28/18  Yes [provider]      PHYSICAL EXAMINATION:   VITAL SIGNS: Blood pressure (!) 148/85, pulse 75, temperature 97.9  F (36.6 C), temperature source Oral, resp. rate 13, weight 87.3 kg, SpO2 98 %.  GENERAL:  78 y.o.-year-old patient lying in the bed with no acute distress.  EYES: Pupils equal, round, reactive to light and accommodation. No scleral icterus. Extraocular muscles intact.  HEENT: Head atraumatic, normocephalic. Oropharynx and nasopharynx clear.  NECK:  Supple, no jugular venous distention. No thyroid enlargement, no tenderness.  LUNGS: Normal breath sounds bilaterally, no wheezing, rales,rhonchi or crepitation. No use of accessory muscles of respiration.  CARDIOVASCULAR: S1, S2 normal. No murmurs, rubs, or gallops.  ABDOMEN: Soft, nontender, nondistended. Bowel sounds present. No organomegaly or mass.  EXTREMITIES: No pedal edema, cyanosis, or clubbing.  NEUROLOGIC: Cranial nerves II through XII are intact. Muscle strength 5/5 in all extremities. Sensation intact. Gait not checked.  PSYCHIATRIC: The patient is alert and oriented x 3.  SKIN: No obvious rash, lesion, or ulcer.   LABORATORY PANEL:   CBC Recent Labs  Lab 02/26/19 1059  WBC 7.5  HGB 14.5  HCT 44.3  PLT 167  MCV 93.3  MCH 30.5  MCHC 32.7  RDW 12.9  LYMPHSABS 1.1  MONOABS 0.3  EOSABS 0.3  BASOSABS 0.1   ------------------------------------------------------------------------------------------------------------------  Chemistries  No results for input(s): NA, K, CL, CO2, GLUCOSE, BUN, CREATININE, CALCIUM, MG, AST, ALT, ALKPHOS, BILITOT in the last 168 hours.  Invalid input(s): GFRCGP ------------------------------------------------------------------------------------------------------------------ CrCl cannot be calculated (Patient's most recent lab result is older than the maximum 21 days allowed.). ------------------------------------------------------------------------------------------------------------------ No results for input(s): TSH, T4TOTAL, T3FREE, THYROIDAB in the last 72 hours.  Invalid input(s):  FREET3   Coagulation profile No results for input(s): INR, PROTIME in the last 168 hours. ------------------------------------------------------------------------------------------------------------------- No results for input(s): DDIMER in the last 72 hours. -------------------------------------------------------------------------------------------------------------------  Cardiac Enzymes No results for input(s): CKMB, TROPONINI, MYOGLOBIN in the last 168 hours.  Invalid input(s): CK ------------------------------------------------------------------------------------------------------------------ Invalid input(s): POCBNP  ---------------------------------------------------------------------------------------------------------------  Urinalysis    Component Value Date/Time   COLORURINE YELLOW 08/11/2018 2301   APPEARANCEUR CLEAR 08/11/2018 2301   LABSPEC 1.011 08/11/2018 2301   PHURINE 6.0 08/11/2018 2301   GLUCOSEU >=500 (A) 08/11/2018 2301   HGBUR SMALL (A) 08/11/2018 2301   BILIRUBINUR NEGATIVE 08/11/2018 2301   KETONESUR NEGATIVE 08/11/2018 2301   PROTEINUR NEGATIVE 08/11/2018 2301   NITRITE NEGATIVE 08/11/2018 2301   LEUKOCYTESUR NEGATIVE 08/11/2018 2301     RADIOLOGY: Ct Head Code Stroke Wo Contrast  Result Date: 02/26/2019 CLINICAL DATA:  Code stroke. Possible stroke. Left arm weakness today EXAM: CT HEAD WITHOUT CONTRAST TECHNIQUE: Contiguous axial images were obtained from the base of the skull through the vertex without intravenous contrast. COMPARISON:  CT 12/18/2018.  MRI 12/19/2018 FINDINGS: Brain: Chronic infarct left parietal lobe unchanged. Chronic microvascular ischemic changes in the white matter and head of caudate on the left, unchanged. Negative for acute infarct.  Negative for acute hemorrhage or mass. Vascular: Atherosclerotic calcification. Negative for hyperdense vessel Skull: Negative Sinuses/Orbits: Mild mucosal edema paranasal sinuses. Bilateral  cataract surgery Other: None ASPECTS (Alberta Stroke Program Early CT Score) - Ganglionic level infarction (caudate, lentiform nuclei, internal capsule, insula, M1-M3 cortex): 7 - Supraganglionic infarction (M4-M6 cortex): 3 Total score (0-10 with 10 being normal): 10 IMPRESSION: 1. No acute intracranial abnormality. 2. Atrophy and chronic ischemic change. Chronic left parietal infarct. 3. ASPECTS is 10 4. These results were called by telephone at the time of interpretation on 02/26/2019 at 11:02 am to Dr. Ileana Roup , who verbally acknowledged these results. Electronically Signed   By: Marlan Palau M.D.   On: 02/26/2019 11:02    EKG: Orders placed or performed during the hospital encounter of 02/26/19  . ED EKG  . ED EKG    IMPRESSION AND PLAN:  Patient 78 year old with history of CVA, diabetes type 2, diabetic neuropathy, glaucoma, hyperlipidemia, gout, macular degeneration presenting with left-sided weakness that has since resolved  1.  TIA We will place patient under observation overnight with telemetry Obtain carotid Doppler Echo of the heart Continue therapy with Eliquis(unclear reason why he is on this)  and aspirin  2.  Diabetes type 2 Place patient on sliding scale insulin continue his home regimen  3.  Hyperlipidemia continue therapy with Lipitor  4.  Glaucoma continue eyedrops  5.   Miscellaneous patient on Eliquis     All the records are reviewed and case discussed with ED provider. Management plans discussed with the patient, family and they are in agreement.  CODE STATUS: Code Status History  Date Active Date Inactive Code Status Order ID Comments User Context   12/19/2018 0340 12/21/2018 1821 Full Code [id number]  Arnaldo Natal, MD ED   09/06/2018 1713 09/07/2018 2114 Full Code [id number]  Enedina Finner, MD Inpatient   08/11/2018 1804 08/13/2018 1720 Full Code [id number]  Janetta Hora Inpatient   02/10/2018 1912 02/12/2018 2237 Full Code [id number]  Janetta Hora Inpatient   05/12/2016 2033 05/13/2016 1704 Full Code [id number]  Shaune Pollack, MD Inpatient       TOTAL TIME TAKING CARE OF THIS PATIENT: 55 minutes.    Auburn Bilberry M.D on 02/26/2019 at 11:26 AM  Between 7am to 6pm - Pager - [phone]  After 6pm go to [email or web address] - password [email or web address]  [employer] [facility] Office  [phone]  CC: Primary care physician; Gracelyn Nurse, MD

## 2019-02-26 NOTE — ED Notes (Signed)
Pt states he woke up this AM at 7. About 7:30 he states L arm weakness that lasted about 5-10 minutes. Denies any at this time. States some blurred vision which has also cleared up. No arm drift. No leg drift. Sensation intact. Speech clear. No facial droop.   Hx stroke. Pt states he believes his last was a few months ago. Takes eliquis and ASA.

## 2019-02-26 NOTE — ED Notes (Signed)
ED TO INPATIENT HANDOFF REPORT  ED Nurse Name and Phone #:  Jae Dire [phone]  S Name/Age/Gender Harold Sandoval 78 y.o. male Room/Bed: ED02A/ED02A  Code Status   Code Status: Prior  Home/SNF/Other Home Patient oriented to: self Is this baseline? Yes   Triage Complete: Triage complete  Chief Complaint stroke like symp  Triage Note Pt to ed with acute onset of left hand, left arm weakness that started at 0730 today. pt alert and oriented at this time.  Pt states he awoke at 0700 and felt fine but around 0730 began to feel "different and funny"  Pt with left hand weakness and some noted mild difficulty answering questions.  Hx of stroke.  Pt taken to B side MD via wheelchair and Gerilyn Pilgrim tech and then straight to CT scan.  Notified Lenise Arena of patient.    Allergies No Known Allergies  Level of Care/Admitting Diagnosis ED Disposition    ED Disposition Condition Comment   Admit  Hospital Area: [facility] [[id number]]  Level of Care: Med-Surg [16]  Diagnosis: TIA (transient ischemic attack) [562130]  Admitting Physician: Auburn Bilberry [[id number]]  Attending Physician: Auburn Bilberry [[id number]]  PT Class (Do Not Modify): Observation [104]  PT Acc Code (Do Not Modify): Observation [10022]       B Medical/Surgery History Past Medical History:  Diagnosis Date  . Arthritis    "all over" (02/10/2018)  . Balance problem    when first stands.  Since stroke.  . Constipation   . 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 Degenerative joint disease (DJD) of hip   . Diabetic neuropathy (HCC)   . Diabetic neuropathy (HCC)    fingers  . 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 Glaucoma, both eyes   . High cholesterol   . History of gout   . 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 Macular degeneration, left eye   . Sinus headache    resolved  . Type  II diabetes mellitus (HCC)    Past Surgical History:  Procedure Laterality Date  . APPENDECTOMY    . BACK SURGERY    . CATARACT EXTRACTION, BILATERAL    . COLONOSCOPY WITH PROPOFOL N/A 12/29/2016   Procedure: COLONOSCOPY WITH PROPOFOL;  Surgeon: Midge Minium, MD;  Location: [facility];  Service: Endoscopy;  Laterality: N/A;  Diabetic - oral meds  . JOINT REPLACEMENT    . LAPAROSCOPIC CHOLECYSTECTOMY    . LOOP RECORDER INSERTION N/A 02/24/2017   Procedure: Loop Recorder Insertion;  Surgeon: Marcina Millard, MD;  Location: [facility] INVASIVE CV LAB;  Service: Cardiovascular;  Laterality: N/A;  . LUMBAR DISC SURGERY    . TEE WITHOUT CARDIOVERSION N/A 12/21/2018   Procedure: TRANSESOPHAGEAL ECHOCARDIOGRAM (TEE);  Surgeon: Dalia Heading, MD;  Location: [facility] OR[facility];  Service: Cardiovascular;  Laterality: N/A;  . TOTAL HIP ARTHROPLASTY Left 02/10/2018   Procedure: TOTAL HIP ARTHROPLASTY ANTERIOR APPROACH;  Surgeon: Gean Birchwood, MD;  Location: [facility];  Service: Orthopedics;  Laterality: Left;  . TOTAL KNEE ARTHROPLASTY Left 08/11/2018   Procedure: LEFT TOTAL KNEE ARTHROPLASTY;  Surgeon: Gean Birchwood, MD;  Location: [facility];  Service: Orthopedics;  Laterality: Left;     A IV Location/Drains/Wounds Patient Lines/Drains/Airways Status   Active Line/Drains/Airways    Name:   Placement date:   Placement time:   Site:   Days:   Peripheral IV 02/26/19  Left Antecubital   02/26/19    1111    Antecubital   less than 1   Incision (Closed) 08/11/18 Knee Left   08/11/18    1533     199          Intake/Output Last 24 hours No intake or output data in the 24 hours ending 02/26/19 1144  Labs/Imaging Results for orders placed or performed during the hospital encounter of 02/26/19 (from the past 48 hour(s))  Glucose, capillary     Status: Abnormal   Collection Time: 02/26/19 10:58 AM  Result Value Ref Range   Glucose-Capillary 135 (H) 70 - 99 mg/dL  CBC     Status: None   Collection Time: 02/26/19  10:59 AM  Result Value Ref Range   WBC 7.5 4.0 - 10.5 K/uL   RBC 4.75 4.22 - 5.81 MIL/uL   Hemoglobin 14.5 13.0 - 17.0 g/dL   HCT 40.9 81.1 - 91.4 %   MCV 93.3 80.0 - 100.0 fL   MCH 30.5 26.0 - 34.0 pg   MCHC 32.7 30.0 - 36.0 g/dL   RDW 78.2 95.6 - 21.3 %   Platelets 167 150 - 400 K/uL   nRBC 0.0 0.0 - 0.2 %    Comment: Performed at [facility], [address] [place], [place] [address]  Differential     Status: None   Collection Time: 02/26/19 10:59 AM  Result Value Ref Range   Neutrophils Relative % 76 %   Neutro Abs 5.8 1.7 - 7.7 K/uL   Lymphocytes Relative 15 %   Lymphs Abs 1.1 0.7 - 4.0 K/uL   Monocytes Relative 4 %   Monocytes Absolute 0.3 0.1 - 1.0 K/uL   Eosinophils Relative 4 %   Eosinophils Absolute 0.3 0.0 - 0.5 K/uL   Basophils Relative 1 %   Basophils Absolute 0.1 0.0 - 0.1 K/uL   Immature Granulocytes 0 %   Abs Immature Granulocytes 0.02 0.00 - 0.07 K/uL    Comment: Performed at [facility], [address], [place], [place] [address]   Ct Head Code Stroke Wo Contrast  Result Date: 02/26/2019 CLINICAL DATA:  Code stroke. Possible stroke. Left arm weakness today EXAM: CT HEAD WITHOUT CONTRAST TECHNIQUE: Contiguous axial images were obtained from the base of the skull through the vertex without intravenous contrast. COMPARISON:  CT 12/18/2018.  MRI 12/19/2018 FINDINGS: Brain: Chronic infarct left parietal lobe unchanged. Chronic microvascular ischemic changes in the white matter and head of caudate on the left, unchanged. Negative for acute infarct.  Negative for acute hemorrhage or mass. Vascular: Atherosclerotic calcification. Negative for hyperdense vessel Skull: Negative Sinuses/Orbits: Mild mucosal edema paranasal sinuses. Bilateral cataract surgery Other: None ASPECTS (Alberta Stroke Program Early CT Score) - Ganglionic level infarction (caudate, lentiform nuclei, internal capsule, insula, M1-M3 cortex): 7 - Supraganglionic infarction (M4-M6  cortex): 3 Total score (0-10 with 10 being normal): 10 IMPRESSION: 1. No acute intracranial abnormality. 2. Atrophy and chronic ischemic change. Chronic left parietal infarct. 3. ASPECTS is 10 4. These results were called by telephone at the time of interpretation on 02/26/2019 at 11:02 am to Dr. Ileana Roup , who verbally acknowledged these results. Electronically Signed   By: Marlan Palau M.D.   On: 02/26/2019 11:02    Pending Labs Unresulted Labs (From admission, onward)    Start     Ordered   02/26/19 1200  Comprehensive metabolic panel  Once,   STAT     02/26/19 1200  02/26/19 1200  Protime-INR  Once,   STAT     02/26/19 1200   02/26/19 1200  APTT  Once,   STAT     02/26/19 1200   Signed and Held  Hemoglobin A1c  Tomorrow morning,   R     Signed and Held   Signed and Held  Lipid panel  Tomorrow morning,   R    Comments:  Fasting    Signed and Held          Vitals/Pain Today's Vitals   02/26/19 1046 02/26/19 1053 02/26/19 1100 02/26/19 1130  BP: 139/84  (!) 148/85 135/84  Pulse: 77  75 73  Resp: 16  13 14   Temp: 97.9 F (36.6 C)     TempSrc: Oral     SpO2: 99%  98% 99%  Weight:  87.3 kg    PainSc:  0-No pain      Isolation Precautions No active isolations  Medications Medications - No data to display  Mobility walks Low fall risk   Focused Assessments NIH 0    R Recommendations: See Admitting Provider Note  Report given to:   Additional Notes:

## 2019-02-26 NOTE — ED Triage Notes (Signed)
Pt to ed with acute onset of left hand, left arm weakness that started at 0730 today. pt alert and oriented at this time.  Pt states he awoke at 0700 and felt fine but around 0730 began to feel "different and funny"  Pt with left hand weakness and some noted mild difficulty answering questions.  Hx of stroke.  Pt taken to B side MD via wheelchair and Gerilyn Pilgrim tech and then straight to CT scan.  Notified Lenise Arena of patient.

## 2019-02-27 ENCOUNTER — Observation Stay: Payer: Medicare HMO

## 2019-02-27 DIAGNOSIS — G459 Transient cerebral ischemic attack, unspecified: Secondary | ICD-10-CM | POA: Diagnosis not present

## 2019-02-27 LAB — LIPID PANEL
Cholesterol: 78 mg/dL (ref 0–200)
HDL: 36 mg/dL — ABNORMAL LOW
LDL Cholesterol: 35 mg/dL (ref 0–99)
Total CHOL/HDL Ratio: 2.2 ratio
Triglycerides: 34 mg/dL
VLDL: 7 mg/dL (ref 0–40)

## 2019-02-27 LAB — ECHOCARDIOGRAM COMPLETE: Weight: 3079.39 [oz_av]

## 2019-02-27 LAB — GLUCOSE, CAPILLARY
Glucose-Capillary: 96 mg/dL (ref 70–99)
Glucose-Capillary: 111 mg/dL — ABNORMAL HIGH (ref 70–99)

## 2019-02-27 LAB — HEMOGLOBIN A1C
Hgb A1c MFr Bld: 6 % — ABNORMAL HIGH (ref 4.8–5.6)
Mean Plasma Glucose: 125.5 mg/dL

## 2019-02-27 NOTE — Evaluation (Signed)
Physical Therapy Evaluation Patient Details Name: Harold Sandoval MRN: [id number] DOB: 11-Oct-1941 Today's Date: 02/27/2019   History of Present Illness  Preseted to ER with LUE numbess; admitted for TIA workup. History of prior CVAs and most recently in January, MRI at that time showed right postcentral gyrus CVA, patient also had a prior remote left parietal lobe infarct and encephalomalacia from that. Also: diabetes type 2, diabetic neuropathy, glaucoma, hyperlipidemia, gout, macular degeneration  Clinical Impression  Pt is a pleasant 78 y/o male with signs and symptoms of resolved TIA. Current impairments in gait abnormalities, high level balance tasks, and general strength d/t other comorbidities. Currently unable to complete lifting, dynamic balance tasks, or endurance walking with safety, inhibiting safety in community activities. Patient demonstrates good safety for home, but is recommended to follow up with op PT for community needs. Would benefit from skilled PT to address above deficits and promote optimal return to PLOF     Follow Up Recommendations Outpatient PT    Equipment Recommendations       Recommendations for Other Services       Precautions / Restrictions Precautions Precautions: Fall Restrictions Weight Bearing Restrictions: No      Mobility  Bed Mobility Overal bed mobility: Independent             General bed mobility comments: no difficulty  Transfers Overall transfer level: Independent               General transfer comment: no difficulty  Ambulation/Gait Ambulation/Gait assistance: Supervision Gait Distance (Feet): 300 Feet Assistive device: None Gait Pattern/deviations: Trunk flexed;Decreased stance time - left Gait velocity: Decreased, but safe Gait velocity interpretation: <1.8 ft/sec, indicate of risk for recurrent falls General Gait Details: Patient reports trunk flexed d/t LBP he has hed for a while; reports some remaining L knee  pain that is altering gait speed, that he is being followed for by PT  Stairs            Wheelchair Mobility    Modified Rankin (Stroke Patients Only) Modified Rankin (Stroke Patients Only) Pre-Morbid Rankin Score: No significant disability Modified Rankin: No significant disability     Balance Overall balance assessment: Independent   Sitting balance-Leahy Scale: Normal       Standing balance-Leahy Scale: Good Standing balance comment: Able to stand with narrow BOS and semi tandem 30sec Single Leg Stance - Right Leg: 5 Single Leg Stance - Left Leg: 3 Tandem Stance - Right Leg: 4 Tandem Stance - Left Leg: 4 Rhomberg - Eyes Opened: 30 Rhomberg - Eyes Closed: 30                 Pertinent Vitals/Pain Pain Assessment: No/denies pain    Home Living                        Prior Function                 Hand Dominance        Extremity/Trunk Assessment   Upper Extremity Assessment Upper Extremity Assessment: Overall WFL for tasks assessed    Lower Extremity Assessment Lower Extremity Assessment: Overall WFL for tasks assessed    Cervical / Trunk Assessment Cervical / Trunk Assessment: Kyphotic  Communication      Cognition Arousal/Alertness: Awake/alert Behavior During Therapy: WFL for tasks assessed/performed Overall Cognitive Status: Within Functional Limits for tasks assessed  General Comments      Exercises Other Exercises Other Exercises: Ambulation over 311f with PT cuing to "stand up tall" and to attempt to correct unequal stance time. Patient able to somewhat comply with cuing, but is unable to fully comply for noormal gait pattern d/t L knee and LBP. PT educated patient on balance ddeficits and gait abnormalities d/t these aliments, and advised continuing outpatient PT for them to reduce fall risk; PT verbalizes understanding Other Exercises: Neg CN screen    Assessment/Plan    PT Assessment All further PT needs can be met in the next venue of care  PT Problem List Decreased strength;Decreased mobility;Decreased balance       PT Treatment Interventions      PT Goals (Current goals can be found in the Care Plan section)  Acute Rehab PT Goals Patient Stated Goal: Return to completing yard work and fishing PT Goal Formulation: With patient Time For Goal Achievement: 03/13/19 Potential to Achieve Goals: Good    Frequency     Barriers to discharge        Co-evaluation               AM-PAC PT "6 Clicks" Mobility  Outcome Measure Help needed turning from your back to your side while in a flat bed without using bedrails?: None Help needed moving from lying on your back to sitting on the side of a flat bed without using bedrails?: None Help needed moving to and from a bed to a chair (including a wheelchair)?: None Help needed standing up from a chair using your arms (e.g., wheelchair or bedside chair)?: None Help needed to walk in hospital room?: None Help needed climbing 3-5 steps with a railing? : A Little 6 Click Score: 23    End of Session Equipment Utilized During Treatment: Gait belt Activity Tolerance: Patient tolerated treatment well Patient left: in bed;with nursing/sitter in room;with call bell/phone within reach Nurse Communication: Mobility status PT Visit Diagnosis: Other abnormalities of gait and mobility (R26.89);Muscle weakness (generalized) (M62.81);Difficulty in walking, not elsewhere classified (R26.2)    Time: [address]PT Time Calculation (min) (ACUTE ONLY): 26 min   Charges:   PT Evaluation $PT Eval Moderate Complexity: 1 Mod PT Treatments $Gait Training: 8-22 mins       CShelton SilvasPT, DPT  [place]3/22/2020, 9:43 AM

## 2019-02-27 NOTE — Consult Note (Signed)
Reason for Consult: stroke symptoms Referring Physician: Dr. Allena Katz   CC: LUE weakness   HPI: Harold Sandoval is an 78 y.o. male history of previous CVA, diabetes type 2, diabetic neuropathy, glaucoma, hyperlipidemia, gout, macular degeneration who is presenting with tingling and weakness in the left upper extremity.  Symptoms lasted for 10 minutes and went away.  Currently back to baseline NIHSS of 0   Past Medical History:  Diagnosis Date  . Arthritis    "all over" (02/10/2018)  . Balance problem    when first stands.  Since stroke.  . Constipation   . 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 Degenerative joint disease (DJD) of hip   . Diabetic neuropathy (HCC)   . Diabetic neuropathy (HCC)    fingers  . 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 Glaucoma, both eyes   . High cholesterol   . History of gout   . 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 Macular degeneration, left eye   . Sinus headache    resolved  . Type II diabetes mellitus (HCC)     Past Surgical History:  Procedure Laterality Date  . APPENDECTOMY    . BACK SURGERY    . CATARACT EXTRACTION, BILATERAL    . COLONOSCOPY WITH PROPOFOL N/A 12/29/2016   Procedure: COLONOSCOPY WITH PROPOFOL;  Surgeon: Midge Minium, MD;  Location: [facility];  Service: Endoscopy;  Laterality: N/A;  Diabetic - oral meds  . JOINT REPLACEMENT    . LAPAROSCOPIC CHOLECYSTECTOMY    . LOOP RECORDER INSERTION N/A 02/24/2017   Procedure: Loop Recorder Insertion;  Surgeon: Marcina Millard, MD;  Location: [facility] INVASIVE CV [facility];  Service: Cardiovascular;  Laterality: N/A;  . LUMBAR DISC SURGERY    . TEE WITHOUT CARDIOVERSION N/A 12/21/2018   Procedure: TRANSESOPHAGEAL ECHOCARDIOGRAM (TEE);  Surgeon: Dalia Heading, MD;  Location: [facility] ORS;  Service: Cardiovascular;  Laterality: N/A;  . TOTAL HIP ARTHROPLASTY Left  02/10/2018   Procedure: TOTAL HIP ARTHROPLASTY ANTERIOR APPROACH;  Surgeon: Gean Birchwood, MD;  Location: [facility] OR;  Service: Orthopedics;  Laterality: Left;  . TOTAL KNEE ARTHROPLASTY Left 08/11/2018   Procedure: LEFT TOTAL KNEE ARTHROPLASTY;  Surgeon: Gean Birchwood, MD;  Location: [facility];  Service: Orthopedics;  Laterality: Left;    Family History  Problem Relation Age of Onset  . Cancer Mother   . Cancer Father     Social History:  reports that he has never smoked. He has never used smokeless tobacco. He reports that he does not drink alcohol or use drugs.  No Known Allergies  Medications: I have reviewed the patient's current medications.  ROS: History obtained from the patient  General ROS: negative for - chills, fatigue, fever, night sweats, weight gain or weight loss Psychological ROS: negative for - behavioral disorder, hallucinations, memory difficulties, mood swings or suicidal ideation Ophthalmic ROS: negative for - blurry vision, double vision, eye pain or loss of vision ENT ROS: negative for - epistaxis, nasal discharge, oral lesions, sore throat, tinnitus or vertigo Allergy and Immunology ROS: negative for - hives or itchy/watery eyes Hematological and Lymphatic ROS: negative for - bleeding problems, bruising or swollen lymph nodes Endocrine ROS: negative for - galactorrhea, hair pattern changes, polydipsia/polyuria or temperature intolerance Respiratory ROS: negative for - cough, hemoptysis, shortness of breath or wheezing Cardiovascular ROS: negative for - chest pain, dyspnea on exertion, edema or  irregular heartbeat Gastrointestinal ROS: negative for - abdominal pain, diarrhea, hematemesis, nausea/vomiting or stool incontinence Genito-Urinary ROS: negative for - dysuria, hematuria, incontinence or urinary frequency/urgency Musculoskeletal ROS: negative for - joint swelling or muscular weakness Neurological ROS: as noted in HPI Dermatological ROS: negative for rash and skin  lesion changes  Physical Examination: Blood pressure 124/76, pulse 64, temperature 97.7 F (36.5 C), temperature source Oral, resp. rate 16, height 6\' 4"  (1.93 m), weight 86.4 kg, SpO2 97 %.   Neurological Examination   Mental Status: Alert, oriented, thought content appropriate.  Speech fluent without evidence of aphasia.  Able to follow 3 step commands without difficulty. Cranial Nerves: II: Discs flat bilaterally; Visual fields grossly normal, pupils equal, round, reactive to light and accommodation III,IV, VI: ptosis not present, extra-ocular motions intact bilaterally V,VII: smile symmetric, facial light touch sensation normal bilaterally VIII: hearing normal bilaterally IX,X: gag reflex present XI: bilateral shoulder shrug XII: midline tongue extension Motor: Right : Upper extremity   5/5    Left:     Upper extremity   5/5  Lower extremity   5/5     Lower extremity   5/5 Tone and bulk:normal tone throughout; no atrophy noted Sensory: Pinprick and light touch intact throughout, bilaterally Deep Tendon Reflexes: 2+ and symmetric throughout Plantars: Right: downgoing   Left: downgoing Cerebellar: normal finger-to-nose, normal rapid alternating movements and normal heel-to-shin test Gait: not tested      Laboratory Studies:   Basic Metabolic Panel: Recent Labs  Lab 02/26/19 1138  NA 139  K 3.5  CL 102  CO2 29  GLUCOSE 179*  BUN 11  CREATININE 0.79  CALCIUM 9.2    Liver Function Tests: Recent Labs  Lab 02/26/19 1138  AST 20  ALT 18  ALKPHOS 79  BILITOT 1.8*  PROT 6.4*  ALBUMIN 4.1   No results for input(s): LIPASE, AMYLASE in the last 168 hours. No results for input(s): AMMONIA in the last 168 hours.  CBC: Recent Labs  Lab 02/26/19 1059  WBC 7.5  NEUTROABS 5.8  HGB 14.5  HCT 44.3  MCV 93.3  PLT 167    Cardiac Enzymes: No results for input(s): CKTOTAL, CKMB, CKMBINDEX, TROPONINI in the last 168 hours.  BNP: Invalid input(s):  POCBNP  CBG: Recent Labs  Lab 02/26/19 1058 02/26/19 1738 02/26/19 2059 02/27/19 0720  GLUCAP 135* 98 100* 96    Microbiology: Results for orders placed or performed during the hospital encounter of 08/04/18  Surgical pcr screen     Status: None   Collection Time: 08/04/18 11:41 AM  Result Value Ref Range Status   MRSA, PCR NEGATIVE NEGATIVE Final   Staphylococcus aureus NEGATIVE NEGATIVE Final    Comment: (NOTE) The Xpert SA Assay (FDA approved for NASAL specimens in patients 12 years of age and older), is one component of a comprehensive surveillance program. It is not intended to diagnose infection nor to guide or monitor treatment. Performed at [facility], [address], [place], [place] [address]     Coagulation Studies: Recent Labs    02/26/19 1138  LABPROT 14.3  INR 1.1    Urinalysis: No results for input(s): COLORURINE, LABSPEC, PHURINE, GLUCOSEU, HGBUR, BILIRUBINUR, KETONESUR, PROTEINUR, UROBILINOGEN, NITRITE, LEUKOCYTESUR in the last 168 hours.  Invalid input(s): APPERANCEUR  Lipid Panel:     Component Value Date/Time   CHOL 78 02/27/2019 0428   TRIG 34 02/27/2019 0428   HDL 36 (L) 02/27/2019 0428   CHOLHDL 2.2 02/27/2019 0428  VLDL 7 02/27/2019 0428   LDLCALC 35 02/27/2019 0428    HgbA1C:  Lab Results  Component Value Date   HGBA1C 6.0 (H) 02/27/2019    Urine Drug Screen:  No results found for: LABOPIA, COCAINSCRNUR, LABBENZ, AMPHETMU, THCU, LABBARB  Alcohol Level: No results for input(s): ETH in the last 168 hours.   Imaging: Mr Brain Wo Contrast  Result Date: 02/27/2019 CLINICAL DATA:  TIA.  Left arm tingling and weakness. EXAM: MRI HEAD WITHOUT CONTRAST TECHNIQUE: Multiplanar, multiecho pulse sequences of the brain and surrounding structures were obtained without intravenous contrast. COMPARISON:  CT head 02/26/2019.  MRI head 12/19/2018 FINDINGS: Brain: Negative for acute infarct Chronic hemorrhagic infarct left  parietal lobe. Chronic microvascular ischemic changes in the white matter, moderate in degree. Chronic lacunar infarctions in the basal ganglia bilaterally. Ventricle size normal. Negative for mass lesion. Vascular: Normal arterial flow voids Skull and upper cervical spine: Negative Sinuses/Orbits: Mucosal edema paranasal sinuses. Bilateral cataract surgery Other: None IMPRESSION: Negative for acute infarct Chronic microvascular ischemic change. Chronic infarct left parietal lobe. Electronically Signed   By: Marlan Palau M.D.   On: 02/27/2019 10:43   US Carotid Bilateral (at [facility] And [facility] Only)  Result Date: 02/26/2019 CLINICAL DATA:  TIA EXAM: BILATERAL CAROTID DUPLEX ULTRASOUND TECHNIQUE: Wallace Cullens scale imaging, color Doppler and duplex ultrasound were performed of bilateral carotid and vertebral arteries in the neck. COMPARISON:  None. FINDINGS: Criteria: Quantification of carotid stenosis is based on velocity parameters that correlate the residual internal carotid diameter with NASCET-based stenosis levels, using the diameter of the distal internal carotid lumen as the denominator for stenosis measurement. The following velocity measurements were obtained: RIGHT ICA: 78 cm/sec CCA: 93 cm/sec SYSTOLIC ICA/CCA RATIO:  0.8 ECA: 118 cm/sec LEFT ICA: 84 cm/sec CCA: 93 cm/sec SYSTOLIC ICA/CCA RATIO:  0.9 ECA: 89 cm/sec RIGHT CAROTID ARTERY: Small amount of calcified plaque at the RIGHT carotid bulb and within the proximal RIGHT internal carotid artery. RIGHT VERTEBRAL ARTERY:  Normal antegrade flow LEFT CAROTID ARTERY: Small amount of calcified plaque at the LEFT carotid bulb and within the proximal LEFT internal carotid artery. LEFT VERTEBRAL ARTERY:  Normal antegrade flow IMPRESSION: No evidence of hemodynamically significant stenosis within the bilateral common carotid or internal carotid arteries. Electronically Signed   By: Bary Richard M.D.   On: 02/26/2019 15:33   Ct Head Code Stroke Wo Contrast  Result  Date: 02/26/2019 CLINICAL DATA:  Code stroke. Possible stroke. Left arm weakness today EXAM: CT HEAD WITHOUT CONTRAST TECHNIQUE: Contiguous axial images were obtained from the base of the skull through the vertex without intravenous contrast. COMPARISON:  CT 12/18/2018.  MRI 12/19/2018 FINDINGS: Brain: Chronic infarct left parietal lobe unchanged. Chronic microvascular ischemic changes in the white matter and head of caudate on the left, unchanged. Negative for acute infarct.  Negative for acute hemorrhage or mass. Vascular: Atherosclerotic calcification. Negative for hyperdense vessel Skull: Negative Sinuses/Orbits: Mild mucosal edema paranasal sinuses. Bilateral cataract surgery Other: None ASPECTS (Alberta Stroke Program Early CT Score) - Ganglionic level infarction (caudate, lentiform nuclei, internal capsule, insula, M1-M3 cortex): 7 - Supraganglionic infarction (M4-M6 cortex): 3 Total score (0-10 with 10 being normal): 10 IMPRESSION: 1. No acute intracranial abnormality. 2. Atrophy and chronic ischemic change. Chronic left parietal infarct. 3. ASPECTS is 10 4. These results were called by telephone at the time of interpretation on 02/26/2019 at 11:02 am to Dr. Ileana Roup , who verbally acknowledged these results. Electronically Signed   By: Marlan Palau M.D.  On: 02/26/2019 11:02     Assessment/Plan:  78 y.o. male history of previous CVA, diabetes type 2, diabetic neuropathy, glaucoma, hyperlipidemia, gout, macular degeneration who is presenting with tingling and weakness in the left upper extremity.  Symptoms lasted for 10 minutes and went away.  Currently back to baseline NIHSS of 0  -  On eliquis and ASA 81mg  at home please continue  -  D/c planning today from neuro stand point - MRI no acute abnormalities and normal carotid US 02/27/2019, 11:34 AM

## 2019-02-27 NOTE — Discharge Instructions (Addendum)
Transient Ischemic Attack A transient ischemic attack (TIA) is a "warning stroke" that causes stroke-like symptoms that go away quickly. A TIA does not cause lasting damage to the brain. But having a TIA is a sign that you may be at risk for a stroke. Lifestyle changes and medical treatments can help prevent a stroke. It is important to know the symptoms of a TIA and what to do. Get help right away, even if your symptoms go away. The symptoms of a TIA are the same as those of a stroke. They can happen fast, and they usually go away within minutes or hours. They can include:  Weakness or loss of feeling in your face, arm, or leg. This often happens on one side of your body.  Trouble walking.  Trouble moving your arms or legs.  Trouble talking or understanding what people are saying.  Trouble seeing.  Seeing two of one object (double vision).  Feeling dizzy.  Feeling confused.  Loss of balance or coordination.  Feeling sick to your stomach (nauseous) and throwing up (vomiting).  A very bad headache for no reason.  What increases the risk? Certain things may make you more likely to have a TIA. Some of these are things that you can change, such as:  Being very overweight (obese).  Using products that contain nicotine or tobacco, such as cigarettes and e-cigarettes.  Taking birth control pills.  Not being active.  Drinking too much alcohol.  Using drugs. Other risk factors include:  Having an irregular heartbeat (atrial fibrillation).  Being African American or Hispanic.  Having had blood clots, stroke, TIA, or heart attack in the past.  Being a woman with a history of high blood pressure in pregnancy (preeclampsia).  Being over the age of 49.  Being male.  Having family history of stroke.  Having the following diseases or conditions: ? High blood pressure. ? High cholesterol. ? Diabetes. ? Heart disease. ? Sickle cell disease. ? Sleep apnea. ? Migraine  headache. ? Long-term (chronic) diseases that cause soreness and swelling (inflammation). ? Disorders that affect how your blood clots. Follow these instructions at home: Medicines   Take over-the-counter and prescription medicines only as told by your doctor.  If you were told to take aspirin or another medicine to thin your blood, take it exactly as told by your doctor. ? Taking too much of the medicine can cause bleeding. ? Taking too little of the medicine may not work to treat the problem. Eating and drinking   Eat 5 or more servings of fruits and vegetables each day.  Follow instructions from your doctor about your diet. You may need to follow a certain diet to help lower your risk of having a stroke. You may need to: ? Eat a diet that is low in fat and salt. ? Eat foods that contain a lot of fiber. ? Limit the amount of carbohydrates and sugar in your diet.  Limit alcohol intake to 1 drink a day for nonpregnant women and 2 drinks a day for men. One drink equals 12 oz of beer, 5 oz of wine, or 1 oz of hard liquor. General instructions  Keep a healthy weight.  Stay active. Try to get at least 30 minutes of activity on all or most days.  Find out if you have a condition called sleep apnea. Get treatment if needed.  Do not use any products that contain nicotine or tobacco, such as cigarettes and e-cigarettes. If you need help  quitting, ask your doctor.  Do not abuse drugs.  Keep all follow-up visits as told by your doctor. This is important. Get help right away if:  You have any signs of stroke. "BE FAST" is an easy way to remember the main warning signs: ? B - Balance. Signs are dizziness, sudden trouble walking, or loss of balance. ? E - Eyes. Signs are trouble seeing or a sudden change in how you see. ? F - Face. Signs are sudden weakness or loss of feeling of the face, or the face or eyelid drooping on one side. ? A - Arms. Signs are weakness or loss of feeling in an  arm. This happens suddenly and usually on one side of the body. ? S - Speech. Signs are sudden trouble speaking, slurred speech, or trouble understanding what people say. ? T - Time. Time to call emergency services. Write down what time symptoms started.  You have other signs of stroke, such as: ? A sudden, very bad headache with no known cause. ? Feeling sick to your stomach (nausea). ? Throwing up (vomiting). ? Jerky movements that you cannot control (seizure). These symptoms may be an emergency. Do not wait to see if the symptoms will go away. Get medical help right away. Call your local emergency services (911 in the [place] Do not drive yourself to the hospital. Summary  A transient ischemic attack (TIA) is a "warning stroke" that causes stroke-like symptoms that go away quickly.  A TIA is a medical emergency. Get help right away, even if your symptoms go away.  A TIA does not cause lasting damage to the brain.  Having a TIA is a sign that you may be at risk for a stroke. Lifestyle changes and medical treatments can help prevent a stroke. This information is not intended to replace advice given to you by your health care provider. Make sure you discuss any questions you have with your health care provider. Document Released: 09/02/2008 Document Revised: 06/01/2018 Document Reviewed: 02/25/2017 Elsevier Interactive Patient Education  2019 [employer]. Diabetic diet  Activity as tolerated  Keep his appointment with [facility] neurology

## 2019-02-27 NOTE — Plan of Care (Signed)
Pt does not have any deficits currently. Pt is independent in the room. No signs of distress noted.

## 2019-02-27 NOTE — Progress Notes (Signed)
Dc instructions given.  VSS. Pt alert and oriented, dressed abd ready to go.  DC instructions, Rx, follow up and when to call EMS given.  Dc home

## 2019-03-02 ENCOUNTER — Other Ambulatory Visit (HOSPITAL_COMMUNITY): Payer: Self-pay | Admitting: Orthopedic Surgery

## 2019-03-02 DIAGNOSIS — M25552 Pain in left hip: Secondary | ICD-10-CM

## 2019-03-03 ENCOUNTER — Other Ambulatory Visit: Payer: Self-pay | Admitting: Orthopedic Surgery

## 2019-03-03 DIAGNOSIS — M25552 Pain in left hip: Secondary | ICD-10-CM

## 2019-03-04 NOTE — Discharge Summary (Signed)
[facility] [place] [facility]   PATIENT NAME: Harold Sandoval    MR#:  [id number]  DATE OF BIRTH:  05/25/1941  DATE OF ADMISSION:  02/26/2019 ADMITTING PHYSICIAN: Auburn Bilberry, MD  DATE OF DISCHARGE: 02/27/2019  1:29 PM  PRIMARY CARE PHYSICIAN: Gracelyn Nurse, MD   ADMISSION DIAGNOSIS:  TIA (transient ischemic attack) [G45.9]  DISCHARGE DIAGNOSIS:  Active Problems:   TIA (transient ischemic attack)   SECONDARY DIAGNOSIS:   Past Medical History:  Diagnosis Date  . Arthritis    "all over" (02/10/2018)  . Balance problem    when first stands.  Since stroke.  . Constipation   . 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 Degenerative joint disease (DJD) of hip   . Diabetic neuropathy (HCC)   . Diabetic neuropathy (HCC)    fingers  . 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 Glaucoma, both eyes   . High cholesterol   . History of gout   . 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 Macular degeneration, left eye   . Sinus headache    resolved  . Type II diabetes mellitus (HCC)      ADMITTING HISTORY  HISTORY OF PRESENT ILLNESS: Harold Sandoval  is a 78 y.o. male with a known history of previous CVA, diabetes type 2, diabetic neuropathy, glaucoma, hyperlipidemia, gout, macular degeneration who is presenting with tingling and weakness in the left upper extremity.  Symptoms lasted for 10 minutes and went away.  Patient had a CT scan of the head which was negative.  Patient was seen in the emergency room the ED physician discussed the case with on-call neurologist who recommended patient be admitted for observation. Patient currently denying any other symptoms including blurred vision or speech difficulties.  HOSPITAL COURSE:   *TIA CT scan of the head and MRI of the brain did not show anything acute. Carotid Dopplers with no significant  narrowing.  Echocardiogram showed no acute abnormalities. Patient on Eliquis and aspirin.  Neurology consulted.  Suggested continuing medications and follow-up with PCP and neurology as outpatient. Patient symptoms have completely resolved.  He is ambulating. Discussed with patient and wife prior to discharge and all questions answered.  Diabetes, hyperlipidemia stable.  Discharge home in stable condition.  CONSULTS OBTAINED:    DRUG ALLERGIES:  No Known Allergies  DISCHARGE MEDICATIONS:   Allergies as of 02/27/2019   No Known Allergies     Medication List    TAKE these medications   apixaban 5 MG Tabs tablet Commonly known as:  Eliquis Take 1 tablet (5 mg total) by mouth 2 (two) times daily.   aspirin EC 81 MG tablet Take 1 tablet (81 mg total) by mouth daily.   atorvastatin 80 MG tablet Commonly known as:  LIPITOR Take 80 mg by mouth every evening.   brimonidine 0.2 % ophthalmic solution Commonly known as:  ALPHAGAN Place 1 drop into both eyes 2 (two) times daily.   brinzolamide 1 % ophthalmic suspension Commonly known as:  AZOPT Place 1 drop into both eyes 2 (two) times daily.   desonide 0.05 % lotion Commonly known as:  DESOWEN Apply 1 application topically 2 (two) times daily.   fluticasone 50 MCG/ACT nasal spray Commonly known as:  FLONASE Place 2 sprays into both nostrils daily as needed for rhinitis.   gabapentin 100 MG capsule Commonly known as:  NEURONTIN Take 200 mg by mouth 2 (two) times daily.   latanoprost 0.005 % ophthalmic solution Commonly known as:  XALATAN Place 1 drop into both eyes at bedtime.   linaclotide 290 MCG Caps capsule Commonly known as:  LINZESS Take 290 mcg by mouth daily.   metFORMIN 500 MG tablet Commonly known as:  GLUCOPHAGE Take 500 mg by mouth 2 (two) times daily with a meal.   tiZANidine 2 MG tablet Commonly known as:  ZANAFLEX Take 1 tablet (2 mg total) by mouth every 6 (six) hours as needed.   traMADol 50  MG tablet Commonly known as:  ULTRAM Take 50 mg by mouth every 4 (four) hours as needed for pain.   Vitamin D3 50 MCG (2000 UT) capsule Take 2,000 Units by mouth daily.       Today   VITAL SIGNS:  Blood pressure 111/81, pulse 72, temperature 97.7 F (36.5 C), temperature source Oral, resp. rate 16, height 6\' 4"  (1.93 m), weight 86.4 kg, SpO2 100 %.  I/O:  No intake or output data in the 24 hours ending 03/04/19 1503  PHYSICAL EXAMINATION:  Physical Exam  GENERAL:  78 y.o.-year-old patient lying in the bed with no acute distress.  LUNGS: Normal breath sounds bilaterally, no wheezing, rales,rhonchi or crepitation. No use of accessory muscles of respiration.  CARDIOVASCULAR: S1, S2 normal. No murmurs, rubs, or gallops.  ABDOMEN: Soft, non-tender, non-distended. Bowel sounds present. No organomegaly or mass.  NEUROLOGIC: Moves all 4 extremities. PSYCHIATRIC: The patient is alert and oriented x 3.  SKIN: No obvious rash, lesion, or ulcer.   DATA REVIEW:   CBC Recent Labs  Lab 02/26/19 1059  WBC 7.5  HGB 14.5  HCT 44.3  PLT 167    Chemistries  Recent Labs  Lab 02/26/19 1138  NA 139  K 3.5  CL 102  CO2 29  GLUCOSE 179*  BUN 11  CREATININE 0.79  CALCIUM 9.2  AST 20  ALT 18  ALKPHOS 79  BILITOT 1.8*    Cardiac Enzymes No results for input(s): TROPONINI in the last 168 hours.  Microbiology Results  Results for orders placed or performed during the hospital encounter of 08/04/18  Surgical pcr screen     Status: None   Collection Time: 08/04/18 11:41 AM  Result Value Ref Range Status   MRSA, PCR NEGATIVE NEGATIVE Final   Staphylococcus aureus NEGATIVE NEGATIVE Final    Comment: (NOTE) The Xpert SA Assay (FDA approved for NASAL specimens in patients 31 years of age and older), is one component of a comprehensive surveillance program. It is not intended to diagnose infection nor to guide or monitor treatment. Performed at [facility],  [address] [place], [place] [address]     RADIOLOGY:  No results found.  Follow up with PCP in 1 week.  Management plans discussed with the patient, family and they are in agreement.  CODE STATUS:  Code Status History    Date Active Date Inactive Code Status Order ID Comments User Context   02/26/2019 1321 02/27/2019 1634 Full Code [id number]  Auburn Bilberry, MD Inpatient   12/19/2018 0340 12/21/2018 1821 Full Code [id number]  Arnaldo Natal, MD ED   09/06/2018 1713 09/07/2018 2114 Full Code [id number]  Enedina Finner, MD Inpatient   08/11/2018 1804 08/13/2018 1720 Full Code [id number]  Allena Katz, PA-C Inpatient   02/10/2018 1912 02/12/2018 2237 Full Code [id number]  Allena Katz, PA-C Inpatient   05/12/2016 2033 05/13/2016 1704  Full Code [id number]  Shaune Pollack, MD Inpatient      TOTAL TIME TAKING CARE OF THIS PATIENT ON DAY OF DISCHARGE: more than 30 minutes.   Molinda Bailiff Belita Warsame M.D on 03/04/2019 at 3:03 PM  Between 7am to 6pm - Pager - [phone]  After 6pm go to [email or web address] - password [email or web address] [facility]  [facility] [place]  Office  [phone]  CC: Primary care physician; Gracelyn Nurse, MD  Note: This dictation was prepared with Dragon dictation along with smaller phrase technology. Any transcriptional errors that result from this process are unintentional.

## 2019-03-31 ENCOUNTER — Encounter
Admission: RE | Admit: 2019-03-31 | Discharge: 2019-03-31 | Disposition: A | Discharge status: Home / Self Care | Payer: Medicare HMO | Source: Ambulatory Visit | Attending: Orthopedic Surgery | Admitting: Orthopedic Surgery

## 2019-03-31 ENCOUNTER — Other Ambulatory Visit: Payer: Self-pay

## 2019-03-31 DIAGNOSIS — M25552 Pain in left hip: Secondary | ICD-10-CM

## 2019-03-31 MED ORDER — TECHNETIUM TC 99M MEDRONATE IV KIT
23.0000 | PACK | Freq: Once | INTRAVENOUS | Status: AC | PRN
  Administered 2019-03-31: 10:00:00 23 via INTRAVENOUS

## 2019-09-20 ENCOUNTER — Other Ambulatory Visit: Payer: Self-pay | Admitting: Internal Medicine

## 2019-09-20 DIAGNOSIS — Z135 Encounter for screening for eye and ear disorders: Secondary | ICD-10-CM

## 2019-09-20 DIAGNOSIS — R1011 Right upper quadrant pain: Secondary | ICD-10-CM

## 2019-09-26 ENCOUNTER — Ambulatory Visit
Admission: RE | Admit: 2019-09-26 | Discharge: 2019-09-26 | Disposition: A | Discharge status: Home / Self Care | Payer: Medicare HMO | Source: Ambulatory Visit | Attending: Internal Medicine | Admitting: Internal Medicine

## 2019-09-26 ENCOUNTER — Other Ambulatory Visit: Payer: Self-pay

## 2019-09-26 DIAGNOSIS — Z135 Encounter for screening for eye and ear disorders: Secondary | ICD-10-CM | POA: Insufficient documentation

## 2019-09-26 DIAGNOSIS — R1011 Right upper quadrant pain: Secondary | ICD-10-CM

## 2019-09-27 ENCOUNTER — Other Ambulatory Visit: Payer: Self-pay | Admitting: Acute Care

## 2019-09-27 DIAGNOSIS — M792 Neuralgia and neuritis, unspecified: Secondary | ICD-10-CM

## 2019-10-07 ENCOUNTER — Other Ambulatory Visit: Payer: Self-pay

## 2019-10-07 ENCOUNTER — Ambulatory Visit
Admission: RE | Admit: 2019-10-07 | Discharge: 2019-10-07 | Disposition: A | Discharge status: Home / Self Care | Payer: Medicare HMO | Source: Ambulatory Visit | Attending: Acute Care | Admitting: Acute Care

## 2019-10-07 DIAGNOSIS — M792 Neuralgia and neuritis, unspecified: Secondary | ICD-10-CM | POA: Diagnosis present

## 2019-10-25 ENCOUNTER — Other Ambulatory Visit: Payer: Self-pay | Admitting: Family Medicine

## 2019-10-25 DIAGNOSIS — M5134 Other intervertebral disc degeneration, thoracic region: Secondary | ICD-10-CM

## 2019-10-31 ENCOUNTER — Other Ambulatory Visit: Payer: Self-pay

## 2019-10-31 ENCOUNTER — Ambulatory Visit
Admission: RE | Admit: 2019-10-31 | Discharge: 2019-10-31 | Disposition: A | Discharge status: Home / Self Care | Payer: Medicare HMO | Source: Ambulatory Visit | Attending: Family Medicine | Admitting: Family Medicine

## 2019-10-31 DIAGNOSIS — M5134 Other intervertebral disc degeneration, thoracic region: Secondary | ICD-10-CM

## 2019-10-31 MED ORDER — IOPAMIDOL (ISOVUE-M 300) INJECTION 61%
1.0000 mL | Freq: Once | INTRAMUSCULAR | Status: AC
  Administered 2019-10-31: 1 mL via EPIDURAL

## 2019-10-31 MED ORDER — DEXAMETHASONE SODIUM PHOSPHATE 4 MG/ML IJ SOLN
8.0000 mg | Freq: Once | INTRAMUSCULAR | Status: AC
  Administered 2019-10-31: 8 mg

## 2019-10-31 NOTE — Discharge Instructions (Signed)

## 2019-12-06 ENCOUNTER — Other Ambulatory Visit: Payer: Self-pay | Admitting: Family Medicine

## 2019-12-06 DIAGNOSIS — M5134 Other intervertebral disc degeneration, thoracic region: Secondary | ICD-10-CM

## 2019-12-12 ENCOUNTER — Other Ambulatory Visit: Payer: Self-pay | Admitting: Family Medicine

## 2019-12-12 ENCOUNTER — Other Ambulatory Visit: Payer: Self-pay

## 2019-12-12 ENCOUNTER — Ambulatory Visit
Admission: RE | Admit: 2019-12-12 | Discharge: 2019-12-12 | Disposition: A | Discharge status: Home / Self Care | Payer: Medicare HMO | Source: Ambulatory Visit | Attending: Family Medicine | Admitting: Family Medicine

## 2019-12-12 DIAGNOSIS — M5134 Other intervertebral disc degeneration, thoracic region: Secondary | ICD-10-CM

## 2019-12-12 MED ORDER — TRIAMCINOLONE ACETONIDE 40 MG/ML IJ SUSP (RADIOLOGY)
60.0000 mg | Freq: Once | INTRAMUSCULAR | Status: AC
  Administered 2019-12-12: 12:00:00 60 mg via EPIDURAL

## 2019-12-12 MED ORDER — IOPAMIDOL (ISOVUE-M 300) INJECTION 61%
1.0000 mL | Freq: Once | INTRAMUSCULAR | Status: AC | PRN
  Administered 2019-12-12: 1 mL via EPIDURAL

## 2019-12-12 NOTE — Discharge Instructions (Signed)

## 2020-02-27 ENCOUNTER — Ambulatory Visit (HOSPITAL_BASED_OUTPATIENT_CLINIC_OR_DEPARTMENT_OTHER): Payer: Medicare HMO | Admitting: Student in an Organized Health Care Education/Training Program

## 2020-02-27 ENCOUNTER — Ambulatory Visit
Admission: RE | Admit: 2020-02-27 | Discharge: 2020-02-27 | Disposition: A | Discharge status: Home / Self Care | Payer: Medicare HMO | Source: Ambulatory Visit | Attending: Student in an Organized Health Care Education/Training Program | Admitting: Student in an Organized Health Care Education/Training Program

## 2020-02-27 ENCOUNTER — Encounter: Payer: Self-pay | Admitting: Student in an Organized Health Care Education/Training Program

## 2020-02-27 ENCOUNTER — Other Ambulatory Visit: Payer: Self-pay

## 2020-02-27 VITALS — BP 107/68 | HR 81 | Temp 98.2°F | Resp 16 | Ht 76.0 in | Wt 200.0 lb

## 2020-02-27 DIAGNOSIS — G588 Other specified mononeuropathies: Secondary | ICD-10-CM | POA: Insufficient documentation

## 2020-02-27 DIAGNOSIS — M5414 Radiculopathy, thoracic region: Secondary | ICD-10-CM

## 2020-02-27 DIAGNOSIS — I639 Cerebral infarction, unspecified: Secondary | ICD-10-CM

## 2020-02-27 DIAGNOSIS — G894 Chronic pain syndrome: Secondary | ICD-10-CM | POA: Diagnosis present

## 2020-02-27 DIAGNOSIS — M47894 Other spondylosis, thoracic region: Secondary | ICD-10-CM

## 2020-02-27 NOTE — Patient Instructions (Signed)
____________________________________________________________________________________________  General Risks and Possible Complications  Patient Responsibilities: It is important that you read this as it is part of your informed consent. It is our duty to inform you of the risks and possible complications associated with treatments offered to you. It is your responsibility as a patient to read this and to ask questions about anything that is not clear or that you believe was not covered in this document.  Patient's Rights: You have the right to refuse treatment. You also have the right to change your mind, even after initially having agreed to have the treatment done. However, under this last option, if you wait until the last second to change your mind, you may be charged for the materials used up to that point.  Introduction: Medicine is not an exact science. Everything in Medicine, including the lack of treatment(s), carries the potential for danger, harm, or loss (which is by definition: Risk). In Medicine, a complication is a secondary problem, condition, or disease that can aggravate an already existing one. All treatments carry the risk of possible complications. The fact that a side effects or complications occurs, does not imply that the treatment was conducted incorrectly. It must be clearly understood that these can happen even when everything is done following the highest safety standards.  No treatment: You can choose not to proceed with the proposed treatment alternative. The "PRO(s)" would include: avoiding the risk of complications associated with the therapy. The "CON(s)" would include: not getting any of the treatment benefits. These benefits fall under one of three categories: diagnostic; therapeutic; and/or palliative. Diagnostic benefits include: getting information which can ultimately lead to improvement of the disease or symptom(s). Therapeutic benefits are those associated with the  successful treatment of the disease. Finally, palliative benefits are those related to the decrease of the primary symptoms, without necessarily curing the condition (example: decreasing the pain from a flare-up of a chronic condition, such as incurable terminal cancer).  General Risks and Complications: These are associated to most interventional treatments. They can occur alone, or in combination. They fall under one of the following six (6) categories: no benefit or worsening of symptoms; bleeding; infection; nerve damage; allergic reactions; and/or death. 1. No benefits or worsening of symptoms: In Medicine there are no guarantees, only probabilities. No healthcare provider can ever guarantee that a medical treatment will work, they can only state the probability that it may. Furthermore, there is always the possibility that the condition may worsen, either directly, or indirectly, as a consequence of the treatment. 2. Bleeding: This is more common if the patient is taking a blood thinner, either prescription or over the counter (example: Goody Powders, Fish oil, Aspirin, Garlic, etc.), or if suffering a condition associated with impaired coagulation (example: Hemophilia, cirrhosis of the liver, low platelet counts, etc.). However, even if you do not have one on these, it can still happen. If you have any of these conditions, or take one of these drugs, make sure to notify your treating physician. 3. Infection: This is more common in patients with a compromised immune system, either due to disease (example: diabetes, cancer, human immunodeficiency virus [HIV], etc.), or due to medications or treatments (example: therapies used to treat cancer and rheumatological diseases). However, even if you do not have one on these, it can still happen. If you have any of these conditions, or take one of these drugs, make sure to notify your treating physician. 4. Nerve Damage: This is more common when the   treatment is  an invasive one, but it can also happen with the use of medications, such as those used in the treatment of cancer. The damage can occur to small secondary nerves, or to large primary ones, such as those in the spinal cord and brain. This damage may be temporary or permanent and it may lead to impairments that can range from temporary numbness to permanent paralysis and/or brain death. 5. Allergic Reactions: Any time a substance or material comes in contact with our body, there is the possibility of an allergic reaction. These can range from a mild skin rash (contact dermatitis) to a severe systemic reaction (anaphylactic reaction), which can result in death. 6. Death: In general, any medical intervention can result in death, most of the time due to an unforeseen complication. ____________________________________________________________________________________________  ____________________________________________________________________________________________  Blood Thinners  IMPORTANT NOTICE:  If you take any of these, make sure to notify the nursing staff.  Failure to do so may result in injury.  Recommended time intervals to stop and restart blood-thinners, before & after invasive procedures  Generic Name Brand Name Stop Time. Must be stopped at least this long before procedures. After procedures, wait at least this long before re-starting.  Abciximab Reopro 15 days 2 hrs  Alteplase Activase 10 days 10 days  Anagrelide Agrylin    Apixaban Eliquis 3 days 6 hrs  Cilostazol Pletal 3 days 5 hrs  Clopidogrel Plavix 7-10 days 2 hrs  Dabigatran Pradaxa 5 days 6 hrs  Dalteparin Fragmin 24 hours 4 hrs  Dipyridamole Aggrenox 11days 2 hrs  Edoxaban Lixiana; Savaysa 3 days 2 hrs  Enoxaparin  Lovenox 24 hours 4 hrs  Eptifibatide Integrillin 8 hours 2 hrs  Fondaparinux  Arixtra 72 hours 12 hrs  Prasugrel Effient 7-10 days 6 hrs  Reteplase Retavase 10 days 10 days  Rivaroxaban Xarelto 3 days 6 hrs   Ticagrelor Brilinta 5-7 days 6 hrs  Ticlopidine Ticlid 10-14 days 2 hrs  Tinzaparin Innohep 24 hours 4 hrs  Tirofiban Aggrastat 8 hours 2 hrs  Warfarin Coumadin 5 days 2 hrs   Other medications with blood-thinning effects  Product indications Generic (Brand) names Note  Cholesterol Lipitor Stop 4 days before procedure  Blood thinner (injectable) Heparin (LMW or LMWH Heparin) Stop 24 hours before procedure  Cancer Ibrutinib (Imbruvica) Stop 7 days before procedure  Malaria/Rheumatoid Hydroxychloroquine (Plaquenil) Stop 11 days before procedure  Thrombolytics  10 days before or after procedures   Over-the-counter (OTC) Products with blood-thinning effects  Product Common names Stop Time  Aspirin > 325 mg Goody Powders, Excedrin, etc. 11 days  Aspirin ? 81 mg  7 days  Fish oil  4 days  Garlic supplements  7 days  Ginkgo biloba  36 hours  Ginseng  24 hours  NSAIDs Ibuprofen, Naprosyn, etc. 3 days  Vitamin E  4 days   ____________________________________________________________________________________________  ____________________________________________________________________________________________  Preparing for your procedure (without sedation)  Procedure appointments are limited to planned procedures: . No Prescription Refills. . No disability issues will be discussed. . No medication changes will be discussed.  Instructions: . Oral Intake: Do not eat or drink anything for at least 3 hours prior to your procedure. . Transportation: Unless otherwise stated by your physician, you may drive yourself after the procedure. . Blood Pressure Medicine: Take your blood pressure medicine with a sip of water the morning of the procedure. . Blood thinners: Notify our staff if you are taking any blood thinners. Depending on which one you take, there  will be specific instructions on how and when to stop it. . Diabetics on insulin: Notify the staff so that you can be scheduled 1st case in  the morning. If your diabetes requires high dose insulin, take only  of your normal insulin dose the morning of the procedure and notify the staff that you have done so. . Preventing infections: Shower with an antibacterial soap the morning of your procedure.  . Build-up your immune system: Take 1000 mg of Vitamin C with every meal (3 times a day) the day prior to your procedure. Marland Kitchen Antibiotics: Inform the staff if you have a condition or reason that requires you to take antibiotics before dental procedures. . Pregnancy: If you are pregnant, call and cancel the procedure. . Sickness: If you have a cold, fever, or any active infections, call and cancel the procedure. . Arrival: You must be in the facility at least 30 minutes prior to your scheduled procedure. . Children: Do not bring any children with you. . Dress appropriately: Bring dark clothing that you would not mind if they get stained. . Valuables: Do not bring any jewelry or valuables.  Reasons to call and reschedule or cancel your procedure: (Following these recommendations will minimize the risk of a serious complication.) . Surgeries: Avoid having procedures within 2 weeks of any surgery. (Avoid for 2 weeks before or after any surgery). . Flu Shots: Avoid having procedures within 2 weeks of a flu shots or . (Avoid for 2 weeks before or after immunizations). . Barium: Avoid having a procedure within 7-10 days after having had a radiological study involving the use of radiological contrast. (Myelograms, Barium swallow or enema study). . Heart attacks: Avoid any elective procedures or surgeries for the initial 6 months after a "Myocardial Infarction" (Heart Attack). . Blood thinners: It is imperative that you stop these medications before procedures. Let us know if you if you take any blood thinner.  . Infection: Avoid procedures during or within two weeks of an infection (including chest colds or gastrointestinal problems). Symptoms  associated with infections include: Localized redness, fever, chills, night sweats or profuse sweating, burning sensation when voiding, cough, congestion, stuffiness, runny nose, sore throat, diarrhea, nausea, vomiting, cold or Flu symptoms, recent or current infections. It is specially important if the infection is over the area that we intend to treat. Marland Kitchen Heart and lung problems: Symptoms that may suggest an active cardiopulmonary problem include: cough, chest pain, breathing difficulties or shortness of breath, dizziness, ankle swelling, uncontrolled high or unusually low blood pressure, and/or palpitations. If you are experiencing any of these symptoms, cancel your procedure and contact your primary care physician for an evaluation.  Remember:  Regular Business hours are:  Monday to Thursday 8:00 AM to 4:00 PM  Provider's Schedule: Delano Metz, MD:  Procedure days: Tuesday and Thursday 7:30 AM to 4:00 PM  Edward Jolly, MD:  Procedure days: Monday and Wednesday 7:30 AM to 4:00 PM ____________________________________________________________________________________________  Epidural Steroid Injection Patient Information  Description: The epidural space surrounds the nerves as they exit the spinal cord.  In some patients, the nerves can be compressed and inflamed by a bulging disc or a tight spinal canal (spinal stenosis).  By injecting steroids into the epidural space, we can bring irritated nerves into direct contact with a potentially helpful medication.  These steroids act directly on the irritated nerves and can reduce swelling and inflammation which often leads to decreased pain.  Epidural steroids may be injected anywhere along the  spine and from the neck to the low back depending upon the location of your pain.   After numbing the skin with local anesthetic (like Novocaine), a small needle is passed into the epidural space slowly.  You may experience a sensation of pressure while this  is being done.  The entire block usually last less than 10 minutes.  Conditions which may be treated by epidural steroids:   Low back and leg pain  Neck and arm pain  Spinal stenosis  Post-laminectomy syndrome  Herpes zoster (shingles) pain  Pain from compression fractures  Preparation for the injection:  1. Do not eat any solid food or dairy products within 8 hours of your appointment.  2. You may drink clear liquids up to 3 hours before appointment.  Clear liquids include water, black coffee, juice or soda.  No milk or cream please. 3. You may take your regular medication, including pain medications, with a sip of water before your appointment  Diabetics should hold regular insulin (if taken separately) and take 1/2 normal NPH dos the morning of the procedure.  Carry some sugar containing items with you to your appointment. 4. A driver must accompany you and be prepared to drive you home after your procedure.  5. Bring all your current medications with your. 6. An IV may be inserted and sedation may be given at the discretion of the physician.   7. A blood pressure cuff, EKG and other monitors will often be applied during the procedure.  Some patients may need to have extra oxygen administered for a short period. 8. You will be asked to provide medical information, including your allergies, prior to the procedure.  We must know immediately if you are taking blood thinners (like Coumadin/Warfarin)  Or if you are allergic to IV iodine contrast (dye). We must know if you could possible be pregnant.  Possible side-effects:  Bleeding from needle site  Infection (rare, may require surgery)  Nerve injury (rare)  Numbness & tingling (temporary)  Difficulty urinating (rare, temporary)  Spinal headache ( a headache worse with upright posture)  Light -headedness (temporary)  Pain at injection site (several days)  Decreased blood pressure (temporary)  Weakness in arm/leg  (temporary)  Pressure sensation in back/neck (temporary)  Call if you experience:  Fever/chills associated with headache or increased back/neck pain.  Headache worsened by an upright position.  New onset weakness or numbness of an extremity below the injection site  Hives or difficulty breathing (go to the emergency room)  Inflammation or drainage at the infection site  Severe back/neck pain  Any new symptoms which are concerning to you  Please note:  Although the local anesthetic injected can often make your back or neck feel good for several hours after the injection, the pain will likely return.  It takes 3-7 days for steroids to work in the epidural space.  You may not notice any pain relief for at least that one week.  If effective, we will often do a series of three injections spaced 3-6 weeks apart to maximally decrease your pain.  After the initial series, we generally will wait several months before considering a repeat injection of the same type.  If you have any questions, please call [phone] [facility]

## 2020-02-27 NOTE — Progress Notes (Signed)
Safety precautions to be maintained throughout the outpatient stay will include: orient to surroundings, keep bed in low position, maintain call bell within reach at all times, provide assistance with transfer out of bed and ambulation.  

## 2020-02-27 NOTE — Progress Notes (Signed)
done

## 2020-02-27 NOTE — Progress Notes (Signed)
Patient: Harold Sandoval  Service Category: E/M  Provider: Gillis Santa, MD  DOB: 1941-05-09  DOS: 02/27/2020  Referring Provider: Harvest Dark, FNP  MRN: [id number]  Setting: Ambulatory outpatient  PCP: Baxter Hire, MD  Type: New Patient  Specialty: Interventional Pain Management    Location: Office  Delivery: Face-to-face     Primary Reason(s) for Visit: Encounter for initial evaluation of one or more chronic problems (new to examiner) potentially causing chronic pain, and posing a threat to normal musculoskeletal function. (Level of risk: High) CC: Arm Pain (lateral thoracic, just under arm pit on the right )  HPI  Harold Sandoval is a 79 y.o. year old, male patient, who comes today to see [place] for the first time for an initial evaluation of his chronic pain. He has Strain of rectus abdominis muscle; Thoracic radiculopathy; Constipation; Change in bowel habits; Osteoarthritis of left hip; Primary osteoarthritis of left hip; Degenerative arthritis of left knee; Primary osteoarthritis of left knee; Acute CVA (cerebrovascular accident) ([place]); TIA (transient ischemic attack); Intercostal neuralgia; Thoracic facet joint syndrome; and Chronic pain syndrome on their problem list. Today he comes in for evaluation of his Arm Pain (lateral thoracic, just under arm pit on the right )  Pain Assessment: Location: Right(just under arm pit in the lateral thoracic area) Arm Radiating: down the right arm and into fingers. Onset: More than a month ago Duration: Chronic pain Quality: Discomfort, Radiating, Throbbing, Stabbing Severity: 7 /10 (subjective, self-reported pain score)  Note: Reported level is compatible with observation.                         When using our objective Pain Scale, levels between 6 and 10/10 are said to belong in an emergency room, as it progressively worsens from a 6/10, described as severely limiting, requiring emergency care not usually available at an outpatient pain management  facility. At a 6/10 level, communication becomes difficult and requires great effort. Assistance to reach the emergency department may be required. Facial flushing and profuse sweating along with potentially dangerous increases in heart rate and blood pressure will be evident. Effect on ADL: area is swollen. Timing: Intermittent Modifying factors: rest seems to make it better but pain is still there BP: 107/68  HR: 81  Onset and Duration: Gradual and Present longer than 3 months Cause of pain: Unknown Severity: Getting worse, NAS-11 at its worse: 10/10, NAS-11 at its best: 7/10, NAS-11 now: 7/10 and NAS-11 on the average: 7/10 Timing: Not influenced by the time of the day Aggravating Factors: Lifiting and Working Alleviating Factors: Medications Associated Problems: Pain that wakes patient up and Pain that does not allow patient to sleep Quality of Pain: Aching, Sharp, Stabbing, Throbbing and Uncomfortable Previous Examinations or Tests: CT scan, MRI scan, X-rays, Neurological evaluation and Orthopedic evaluation Previous Treatments: Epidural steroid injections  The patient comes into the clinics today for the first time for a chronic pain management evaluation.   Patient is a 79 year old male with a chief complaint of right sided thoracic pain, intercostal pain that radiates anteriorly to his right chest abdomen region.  He has also had similar symptoms on the left side but that were on his lower back region.  He has trouble sleeping at night due to the radiating pain along his right axilla which radiates anteriorly to his chest.  He has not tried physical therapy for this specific condition.  He takes tramadol intermittently and has tried prednisone  in the past.  He is on gabapentin 300 mg 3 times a day.  Of note he has had a right T11 nerve root block, transforaminal ESI at [facility] imaging with provided 40% pain relief for approximately 1 week.  Patient does have a history of lower lumbar  spinal fusion that was done in 1989.  He does have a history of stroke.  Patient did see neurosurgery, Dr. Cari Caraway on 11/24/2019.  Surgery was not recommended.  Referral was placed for physical therapy and chiropractic care.  He is not on chronic opioid therapy.  Historic Controlled Substance Pharmacotherapy Review   Historical Monitoring: The patient  reports no history of drug use. List of all UDS Test(s): Lab Results  Component Value Date   ETH <10 12/18/2018   ETH <10 09/06/2018   List of other Serum/Urine Drug Screening Test(s):  Lab Results  Component Value Date   ETH <10 12/18/2018   ETH <10 09/06/2018   Historical Background Evaluation: [place] PMP: PDMP reviewed during this encounter. Six (6) year initial data search conducted.             Risk Assessment Profile: Aberrant behavior: None observed or detected today Risk factors for fatal opioid overdose: None identified today Fatal overdose hazard ratio (HR): Calculation deferred Non-fatal overdose hazard ratio (HR): Calculation deferred Risk of opioid abuse or dependence: 0.7-3.0% with doses ? 36 MME/day and 6.1-26% with doses ? 120 MME/day. Substance use disorder (SUD) risk level: See below Personal History of Substance Abuse (SUD-Substance use disorder):  Alcohol: Negative  Illegal Drugs: Negative  Rx Drugs: Negative  ORT Risk Level calculation: Low Risk Opioid Risk Tool - 02/27/20 1014      Family History of Substance Abuse   Alcohol  Negative    Illegal Drugs  Negative    Rx Drugs  Negative      Personal History of Substance Abuse   Alcohol  Negative    Illegal Drugs  Negative    Rx Drugs  Negative      Age   Age between 49-45 years   No      Psychological Disease   Psychological Disease  Negative    Depression  Negative      Total Score   Opioid Risk Tool Scoring  0    Opioid Risk Interpretation  Low Risk      ORT Scoring interpretation table:  Score <3 = Low Risk for SUD  Score between 4-7 =  Moderate Risk for SUD  Score >8 = High Risk for Opioid Abuse   PHQ-2 Depression Scale:  Total score:    PHQ-2 Scoring interpretation table: (Score and probability of major depressive disorder)  Score 0 = No depression  Score 1 = 15.4% Probability  Score 2 = 21.1% Probability  Score 3 = 38.4% Probability  Score 4 = 45.5% Probability  Score 5 = 56.4% Probability  Score 6 = 78.6% Probability   PHQ-9 Depression Scale:  Total score:    PHQ-9 Scoring interpretation table:  Score 0-4 = No depression  Score 5-9 = Mild depression  Score 10-14 = Moderate depression  Score 15-19 = Moderately severe depression  Score 20-27 = Severe depression (2.4 times higher risk of SUD and 2.89 times higher risk of overuse)   Pharmacologic Plan: As per protocol, I have not taken over any controlled substance management, pending the results of ordered tests and/or consults.            Initial impression: Pending review  of available data and ordered tests.  Meds   Current Outpatient Medications:  .  apixaban (ELIQUIS) 5 MG TABS tablet, Take 1 tablet (5 mg total) by mouth 2 (two) times daily., Disp: 60 tablet, Rfl: 2 .  atorvastatin (LIPITOR) 80 MG tablet, Take 80 mg by mouth every evening. , Disp: , Rfl:  .  brimonidine (ALPHAGAN) 0.2 % ophthalmic solution, Place 1 drop into both eyes 2 (two) times daily. , Disp: , Rfl: 0 .  brinzolamide (AZOPT) 1 % ophthalmic suspension, Place 1 drop into both eyes 2 (two) times daily., Disp: , Rfl:  .  Cholecalciferol (VITAMIN D3) 2000 units capsule, Take 2,000 Units by mouth daily., Disp: , Rfl:  .  CLARITIN 10 MG tablet, Take 10 mg by mouth daily., Disp: , Rfl:  .  desonide (DESOWEN) 0.05 % lotion, Apply 1 application topically 2 (two) times daily., Disp: , Rfl:  .  fluticasone (FLONASE) 50 MCG/ACT nasal spray, Place 2 sprays into both nostrils daily as needed for rhinitis., Disp: , Rfl:  .  gabapentin (NEURONTIN) 300 MG capsule, Take 300 mg by mouth 3 (three) times  daily., Disp: , Rfl:  .  latanoprost (XALATAN) 0.005 % ophthalmic solution, Place 1 drop into both eyes at bedtime. , Disp: , Rfl: 0 .  metFORMIN (GLUCOPHAGE) 500 MG tablet, Take 500 mg by mouth 2 (two) times daily with a meal., Disp: , Rfl: 11 .  traMADol (ULTRAM) 50 MG tablet, Take 50 mg by mouth every 4 (four) hours as needed for pain., Disp: , Rfl:  .  linaclotide (LINZESS) 290 MCG CAPS capsule, Take 290 mcg by mouth daily., Disp: , Rfl:   Imaging Review   Thoracic Imaging: Thoracic MR wo contrast:  Results for orders placed during the hospital encounter of 10/07/19  MR THORACIC SPINE WO CONTRAST   Narrative CLINICAL DATA:  Right-sided chest pain for 4 months. No known injury.  EXAM: MRI THORACIC SPINE WITHOUT CONTRAST  TECHNIQUE: Multiplanar, multisequence MR imaging of the thoracic spine was performed. No intravenous contrast was administered.  COMPARISON:  None.  FINDINGS: Alignment:  Physiologic.  Vertebrae: There is no fracture or mass lesion. There is focal prominent arthritis at the fourth costovertebral joint and the right facet joint at T3-4 with what appears to be calcification for filling the right neural foramen at T3-4.  The patient has prominent degenerative changes between the spinous processes of T10 and T11.  Cord:  Normal signal and morphology.  Paraspinal and other soft tissues: Multilevel costovertebral arthritis in the thoracic spine, more prominent on the right than the left.  Disc levels:  C7-T1 and T1-2: Negative.  T2-3: Vertebral endplate osteophytes extend into both neural foramina creating moderate bilateral foraminal stenosis, right greater than left which might affect either or both T2 nerves.  T3-4: Calcification fills most of the right neural foramen at T3-4 which may arise from the facet joint or the adjacent costovertebral joint. This could affect the right T3 nerve.  T4-5 through T7-8: Disc degeneration without disc protrusion  or foraminal stenosis or spinal stenosis.  T8-9: Tiny central disc protrusion that slightly indents the ventral aspect of the spinal cord without myelopathy. Otherwise negative.  T9-10: Negative.  T10-11: Tiny disc bulge to the left of midline without neural impingement. 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  T11-12: No disc bulging or protrusion. Moderate right and moderately severe left facet arthritis. No visible neural impingement.  T12-L1: Tiny  broad-based disc bulge without neural impingement. Bilateral facet arthritis, left greater than right.  IMPRESSION: 1. Prominent arthritis at the fourth costovertebral joint and the right facet joint at T3-4 dense calcification in the right neural foramen which could affect the right T3 nerve. 2. Moderate bilateral foraminal stenosis at T2-3 which might affect either or both T2 nerves. 3. 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 4. Tiny central disc protrusion at T8-9 without significant neural impingement.   Electronically Signed   By: Lorriane Shire M.D.   On: 10/07/2019 08:43      Complexity Note: Imaging results reviewed. Results shared with Mr. Wagar, using Layman's terms.                         ROS  Cardiovascular: No reported cardiovascular signs or symptoms such as High blood pressure, coronary artery disease, abnormal heart rate or rhythm, heart attack, blood thinner therapy or heart weakness and/or failure Pulmonary or Respiratory: No reported pulmonary signs or symptoms such as wheezing and difficulty taking a deep full breath (Asthma), difficulty blowing air out (Emphysema), coughing up mucus (Bronchitis), persistent dry cough, or temporary stoppage of breathing during sleep Neurological: No reported neurological signs or symptoms such as seizures, abnormal skin sensations, urinary and/or fecal incontinence, being born  with an abnormal open spine and/or a tethered spinal cord Psychological-Psychiatric: No reported psychological or psychiatric signs or symptoms such as difficulty sleeping, anxiety, depression, delusions or hallucinations (schizophrenial), mood swings (bipolar disorders) or suicidal ideations or attempts Gastrointestinal: No reported gastrointestinal signs or symptoms such as vomiting or evacuating blood, reflux, heartburn, alternating episodes of diarrhea and constipation, inflamed or scarred liver, or pancreas or irrregular and/or infrequent bowel movements Genitourinary: No reported renal or genitourinary signs or symptoms such as difficulty voiding or producing urine, peeing blood, non-functioning kidney, kidney stones, difficulty emptying the bladder, difficulty controlling the flow of urine, or chronic kidney disease Hematological: No reported hematological signs or symptoms such as prolonged bleeding, low or poor functioning platelets, bruising or bleeding easily, hereditary bleeding problems, low energy levels due to low hemoglobin or being anemic Endocrine: High blood sugar controlled without the use of insulin (NIDDM) Rheumatologic: Joint aches and or swelling due to excess weight (Osteoarthritis) Musculoskeletal: Negative for myasthenia gravis, muscular dystrophy, multiple sclerosis or malignant hyperthermia Work History: Retired  Allergies  Mr. Selner has No Known Allergies.  Laboratory Chemistry Profile   Renal Lab Results  Component Value Date   BUN 11 02/26/2019   CREATININE 0.79 02/26/2019   GFRAA >60 02/26/2019   GFRNONAA >60 02/26/2019   PROTEINUR NEGATIVE 08/11/2018    Electrolytes Lab Results  Component Value Date   NA 139 02/26/2019   K 3.5 02/26/2019   CL 102 02/26/2019   CALCIUM 9.2 02/26/2019    Hepatic Lab Results  Component Value Date   AST 20 02/26/2019   ALT 18 02/26/2019   ALBUMIN 4.1 02/26/2019   ALKPHOS 79 02/26/2019   AMMONIA 9 09/06/2018     ID Lab Results  Component Value Date   STAPHAUREUS NEGATIVE 08/04/2018   MRSAPCR NEGATIVE 08/04/2018    Bone No results found for: VD25OH, IN867EH2CNO, BS9628ZM6, QH4765YY5, 25OHVITD1, 25OHVITD2, 25OHVITD3, TESTOFREE, TESTOSTERONE  Endocrine Lab Results  Component Value Date   GLUCOSE 179 (H) 02/26/2019   GLUCOSEU >=500 (A) 08/11/2018   HGBA1C 6.0 (H) 02/27/2019   TSH 1.703 12/18/2018    Neuropathy Lab Results  Component Value Date  HGBA1C 6.0 (H) 02/27/2019    CNS No results found for: COLORCSF, APPEARCSF, RBCCOUNTCSF, WBCCSF, POLYSCSF, LYMPHSCSF, EOSCSF, PROTEINCSF, GLUCCSF, JCVIRUS, CSFOLI, IGGCSF, LABACHR, ACETBL, LABACHR, ACETBL  Inflammation (CRP: Acute  ESR: Chronic) No results found for: CRP, ESRSEDRATE, LATICACIDVEN  Rheumatology No results found for: RF, ANA, LABURIC, URICUR, LYMEIGGIGMAB, LYMEABIGMQN, HLAB27  Coagulation Lab Results  Component Value Date   INR 1.1 02/26/2019   LABPROT 14.3 02/26/2019   APTT 28 02/26/2019   PLT 167 02/26/2019    Cardiovascular Lab Results  Component Value Date   TROPONINI <0.03 12/18/2018   HGB 14.5 02/26/2019   HCT 44.3 02/26/2019    Screening Lab Results  Component Value Date   STAPHAUREUS NEGATIVE 08/04/2018   MRSAPCR NEGATIVE 08/04/2018    Cancer No results found for: CEA, CA125, LABCA2  Allergens No results found for: ALMOND, APPLE, ASPARAGUS, AVOCADO, BANANA, BARLEY, BASIL, BAYLEAF, GREENBEAN, LIMABEAN, WHITEBEAN, BEEFIGE, REDBEET, BLUEBERRY, BROCCOLI, CABBAGE, MELON, CARROT, CASEIN, CASHEWNUT, CAULIFLOWER, CELERY    Note: Lab results reviewed.   [place]  Drug: Mr. Carithers  reports no history of drug use. Alcohol:  reports no history of alcohol use. Tobacco:  reports that he has never smoked. He has never used smokeless tobacco. 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 Family: family history includes Cancer in his father and mother.  Past Surgical History:  Procedure Laterality Date  . APPENDECTOMY    . BACK SURGERY    . CATARACT EXTRACTION, BILATERAL    . COLONOSCOPY WITH PROPOFOL N/A 12/29/2016   Procedure: COLONOSCOPY WITH PROPOFOL;  Surgeon: Lucilla Lame, MD;  Location: [place];  Service: Endoscopy;  Laterality: N/A;  Diabetic - oral meds  . JOINT REPLACEMENT    . LAPAROSCOPIC CHOLECYSTECTOMY    . LOOP RECORDER INSERTION N/A 02/24/2017   Procedure: Loop Recorder Insertion;  Surgeon: Isaias Cowman, MD;  Location: [place] [facility];  Service: Cardiovascular;  Laterality: N/A;  . LUMBAR [place]    . TEE WITHOUT CARDIOVERSION N/A 12/21/2018   Procedure: TRANSESOPHAGEAL ECHOCARDIOGRAM (TEE);  Surgeon: Teodoro Spray, MD;  Location: [facility];  Service: Cardiovascular;  Laterality: N/A;  . TOTAL HIP ARTHROPLASTY Left 02/10/2018   Procedure: TOTAL HIP ARTHROPLASTY ANTERIOR APPROACH;  Surgeon: Frederik Pear, MD;  Location: [place];  Service: Orthopedics;  Laterality: Left;  . TOTAL KNEE ARTHROPLASTY Left 08/11/2018   Procedure: LEFT TOTAL KNEE ARTHROPLASTY;  Surgeon: Frederik Pear, MD;  Location: [facility];  Service: Orthopedics;  Laterality: Left;   Active Ambulatory Problems    Diagnosis Date Noted  . Strain of rectus abdominis muscle 08/01/2015  . Thoracic radiculopathy 05/12/2016  . Constipation   . Change in bowel habits   . Osteoarthritis of left hip 02/09/2018  . 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 Degenerative arthritis of left knee 08/10/2018  . Primary osteoarthritis of left knee 08/11/2018  . Acute CVA (cerebrovascular accident) ([place]) 09/06/2018  . TIA (transient ischemic  attack) 12/19/2018  . 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  . Thoracic facet joint syndrome 02/27/2020  . Chronic pain syndrome 02/27/2020   Resolved Ambulatory Problems    Diagnosis Date Noted  . No Resolved Ambulatory Problems   Past Medical History:  Diagnosis Date  .  Arthritis   . Balance problem   . Cryptogenic stroke ([place]) 11/24/2015  . Degenerative joint disease (DJD) of hip   . Diabetic neuropathy ([place])   . Diabetic neuropathy ([place])   . Diverticulosis of rectosigmoid 06/26/2016  . Glaucoma, both eyes   . High cholesterol   . History of gout   . 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 Macular degeneration, left eye   . Sinus headache   . Type II diabetes mellitus ([place])    Constitutional Exam  General appearance: Well nourished, well developed, and well hydrated. In no apparent acute distress Vitals:   02/27/20 1006  BP: 107/68  Pulse: 81  Resp: 16  Temp: 98.2 F (36.8 C)  TempSrc: Temporal  SpO2: 100%  Weight: 200 lb (90.7 kg)  Height: 6' 4"  (1.93 m)   BMI Assessment: Estimated body mass index is 24.34 kg/m as calculated from the following:   Height as of this encounter: 6' 4"  (1.93 m).   Weight as of this encounter: 200 lb (90.7 kg).  BMI interpretation table: BMI level Category Range association with higher incidence of chronic pain  <18 kg/m2 Underweight   18.5-24.9 kg/m2 Ideal body weight   25-29.9 kg/m2 Overweight Increased incidence by 20%  30-34.9 kg/m2 Obese (Class I) Increased incidence by 68%  35-39.9 kg/m2 Severe obesity (Class II) Increased incidence by 136%  >40 kg/m2 Extreme obesity (Class III) Increased incidence by 254%   Patient's current BMI Ideal Body weight  Body mass index is 24.34 kg/m. Ideal body weight: 86.8 kg (191 lb 5.7 oz) Adjusted ideal body weight: 88.4 kg (194 lb 13 oz)   BMI Readings from Last 4 Encounters:  02/27/20 24.34 kg/m  02/26/19 23.18 kg/m  12/21/18 23.43 kg/m  09/06/18 23.74 kg/m   Wt Readings from Last  4 Encounters:  02/27/20 200 lb (90.7 kg)  02/26/19 190 lb 6.4 oz (86.4 kg)  12/21/18 192 lb 8 oz (87.3 kg)  09/06/18 195 lb (88.5 kg)    Psych/Mental status: Alert, oriented x 3 (person, place, & time)       Eyes: PERLA Respiratory: No evidence of acute respiratory distress  Cervical Spine Exam  Skin & Axial Inspection: No masses, redness, edema, swelling, or associated skin lesions Alignment: Symmetrical Functional ROM: Unrestricted ROM      Stability: No instability detected Muscle Tone/Strength: Functionally intact. No obvious neuro-muscular anomalies detected. Sensory (Neurological): Unimpaired Palpation: No palpable anomalies              Upper Extremity (UE) Exam    Side: Right upper extremity  Side: Left upper extremity  Skin & Extremity Inspection: Skin color, temperature, and hair growth are WNL. No peripheral edema or cyanosis. No masses, redness, swelling, asymmetry, or associated skin lesions. No contractures.  Skin & Extremity Inspection: Skin color, temperature, and hair growth are WNL. No peripheral edema or cyanosis. No masses, redness, swelling, asymmetry, or associated skin lesions. No contractures.  Functional ROM: Pain restricted ROM for shoulder and elbow  Functional ROM: Unrestricted ROM          Muscle Tone/Strength: Functionally intact. No obvious neuro-muscular anomalies detected.  Muscle Tone/Strength: Functionally intact. No obvious neuro-muscular anomalies detected.  Sensory (Neurological): Dermatomal pain pattern          Sensory (Neurological): Unimpaired          Palpation: No palpable anomalies              Palpation: No palpable anomalies  Provocative Test(s):  Phalen's test: deferred Tinel's test: deferred Apley's scratch test (touch opposite shoulder):  Action 1 (Across chest): Decreased ROM Action 2 (Overhead): Decreased ROM Action 3 (LB reach): Decreased ROM   Provocative Test(s):  Phalen's test: deferred Tinel's test:  deferred Apley's scratch test (touch opposite shoulder):  Action 1 (Across chest): deferred Action 2 (Overhead): deferred Action 3 (LB reach): deferred    Thoracic Spine Area Exam  Skin & Axial Inspection: No masses, redness, or swelling Alignment: Symmetrical Functional ROM: Pain restricted ROM Stability: No instability detected Muscle Tone/Strength: Functionally intact. No obvious neuro-muscular anomalies detected. Sensory (Neurological): Neuropathic pain pattern Muscle strength & Tone: No palpable anomalies  Lumbar Exam  Skin & Axial Inspection: Well healed scar from previous spine surgery detected Alignment: Symmetrical Functional ROM: Decreased ROM       Stability: No instability detected Muscle Tone/Strength: Functionally intact. No obvious neuro-muscular anomalies detected. Sensory (Neurological): Dermatomal pain pattern Provocative Tests: Hyperextension/rotation test: (+) bilaterally for facet joint pain. Lumbar quadrant test (Kemp's test): (+) bilaterally for facet joint pain. Lateral bending test: deferred today       Patrick's Maneuver: deferred today                   FABER* test: deferred today                   S-I anterior distraction/compression test: deferred today         S-I lateral compression test: deferred today         S-I Thigh-thrust test: deferred today         S-I Gaenslen's test: deferred today         *(Flexion, ABduction and External Rotation)  Gait & Posture Assessment  Ambulation: Unassisted Gait: Relatively normal for age and body habitus Posture: WNL   Lower Extremity Exam    Side: Right lower extremity  Side: Left lower extremity  Stability: No instability observed          Stability: No instability observed          Skin & Extremity Inspection: Skin color, temperature, and hair growth are WNL. No peripheral edema or cyanosis. No masses, redness, swelling, asymmetry, or associated skin lesions. No contractures.  Skin & Extremity  Inspection: Skin color, temperature, and hair growth are WNL. No peripheral edema or cyanosis. No masses, redness, swelling, asymmetry, or associated skin lesions. No contractures.  Functional ROM: Unrestricted ROM                  Functional ROM: Unrestricted ROM                  Muscle Tone/Strength: Functionally intact. No obvious neuro-muscular anomalies detected.  Muscle Tone/Strength: Functionally intact. No obvious neuro-muscular anomalies detected.  Sensory (Neurological): Unimpaired        Sensory (Neurological): Unimpaired        DTR: Patellar: deferred today Achilles: deferred today Plantar: deferred today  DTR: Patellar: deferred today Achilles: deferred today Plantar: deferred today  Palpation: No palpable anomalies  Palpation: No palpable anomalies   Assessment  Primary Diagnosis & Pertinent Problem List: The primary encounter diagnosis was Radicular pain of thoracic region. Diagnoses of Thoracic radiculopathy (T2/3), Acute CVA (cerebrovascular accident) ([place]), Intercostal neuralgia, Thoracic facet joint syndrome, and Chronic pain syndrome were also pertinent to this visit.  Visit Diagnosis (New problems to examiner): 1. Radicular pain of thoracic region   2. Thoracic radiculopathy (T2/3)   3. Acute CVA (  cerebrovascular accident) ([place])   4. Intercostal neuralgia   5. Thoracic facet joint syndrome   6. Chronic pain syndrome    Plan of Care (Initial workup plan)  General Recommendations: The pain condition that the patient suffers from is best treated with a multidisciplinary approach that involves an increase in physical activity to prevent de-conditioning and worsening of the pain cycle, as well as psychological counseling (formal and/or informal) to address the co-morbid psychological affects of pain. Treatment will often involve judicious use of pain medications and interventional procedures to decrease the pain, allowing the patient to participate in the physical activity  that will ultimately produce long-lasting pain reductions. The goal of the multidisciplinary approach is to return the patient to a higher level of overall function and to restore their ability to perform activities of daily living.  79 year old male who presents with a chief complaint of right-sided thoracic pain that radiates anteriorly towards his chest and abdomen region.  This is likely related to thoracic radiculopathy at T2-T3 due to bilateral T2 foraminal stenosis as noted on MRI.  Patient has had previous thoracic transforaminal epidural steroid injections which were lower in his thoracic spine that were helpful for radiating pain down lower.  Given that his pain radiation has shifted superiorly, discussed diagnostic T2-T3 epidural steroid injection under fluoroscopy.  Risks and benefits of this procedure were reviewed in great detail and patient would like to proceed.  Patient is anticoagulated on Eliquis given history of CVA.  Patient will need to discontinue this medication for 3 days prior to thoracic epidural steroid injection.  We will reach out to the patient's cardiologist for cardiac clearance.  Future considerations could also include right-sided diagnostic intercostal nerve block at T4, T5, T6.  Plan: -T2-T3 diagnostic epidural steroid injection with sedation.  We will reach out to cardiology to get clearance to stop Eliquis 3 days prior to elective, thoracic spinal injection -Future considerations include right intercostal nerve block -Do not recommend thoracolumbar spinal cord stimulation as patient's thoracic interlaminar windows show significant facet arthritis making it technically difficult to place a percutaneous electrode.   Imaging Orders     DG PAIN CLINIC C-ARM 1-60 MIN NO REPORT  Procedure Orders     Thoracic Epidural Injection  Interventional management options: Mr. Lindroth was informed that there is no guarantee that he would be a candidate for interventional  therapies. The decision will be based on the results of diagnostic studies, as well as Mr. Kagel risk profile.  Procedure(s) under consideration:  T2-T3 thoracic epidural steroid injection Right-sided costal nerve block   Provider-requested follow-up: Return in about 1 week (around 03/05/2020) for T2/3 ESI , with sedation (stop Eliquis 3 days prior).  No future appointments.  Note by: Gillis Santa, MD Date: 02/27/2020; Time: 11:42 AM

## 2020-02-28 ENCOUNTER — Ambulatory Visit: Payer: Medicare HMO | Admitting: Student in an Organized Health Care Education/Training Program

## 2020-02-29 ENCOUNTER — Other Ambulatory Visit: Payer: Self-pay | Admitting: Student in an Organized Health Care Education/Training Program

## 2020-03-01 ENCOUNTER — Telehealth: Payer: Self-pay | Admitting: *Deleted

## 2020-03-01 NOTE — Telephone Encounter (Signed)
Spoke with Kara Mead, SO of patient to let her know status of medical clearance that was sent to DR Fath's office to stop Eliquis prior to procedure.  We have sent it on 02/27/20 and f/up today 03/01/20.

## 2020-03-12 ENCOUNTER — Telehealth: Payer: Self-pay | Admitting: Student in an Organized Health Care Education/Training Program

## 2020-03-12 NOTE — Telephone Encounter (Signed)
Patient is being denied by Insurance to have the epidural. They would like to find out about how much it would cost if they wanted to pay for it themselves. Also would like to find out if there is any other course of treatment they could try. The patient in very uncomfortable and in pain most of the time. Please call  Patient with results of what Dr. Cherylann Ratel  Suggest.

## 2020-03-12 NOTE — Telephone Encounter (Signed)
Any suggestions regarding alternative treatment since insurance will not approve ESI?

## 2020-04-03 ENCOUNTER — Telehealth: Payer: Self-pay | Admitting: Student in an Organized Health Care Education/Training Program

## 2020-04-03 NOTE — Telephone Encounter (Signed)
Dr. Cherylann Ratel, this patient's insurance has denied him having the procedure you ordered. Do you want him brought in for an eval to discuss further steps? They want to know if there was a peer to peer done with insurance to possibly get approval for this procedure?

## 2020-04-04 NOTE — Telephone Encounter (Signed)
error 

## 2020-04-05 NOTE — Telephone Encounter (Signed)
This was submitted while I was out on leave. I resubmitted today, to try again first.

## 2020-04-18 ENCOUNTER — Telehealth: Payer: Self-pay

## 2020-04-18 NOTE — Telephone Encounter (Signed)
Patient's daughter called to see if anyone got clearance from Dr. Don Perking office for him to stop the eliquis for 3 days. His procedure is scheduled for 04/25/20. Please call her back and let her know.

## 2020-04-18 NOTE — Telephone Encounter (Signed)
Spoke with Kara Mead, we have received clearance for patient to stop Eliquis x 3 days prior to procedure.  Will stop on Sunday.  Discussed procedure and options for sedations vs no sedation.  Kara Mead states he has had many procedures without sedation and she feels he will do fine without.

## 2020-04-25 ENCOUNTER — Encounter: Payer: Self-pay | Admitting: Student in an Organized Health Care Education/Training Program

## 2020-04-25 ENCOUNTER — Ambulatory Visit
Admission: RE | Admit: 2020-04-25 | Discharge: 2020-04-25 | Disposition: A | Discharge status: Home / Self Care | Payer: Medicare HMO | Source: Ambulatory Visit | Attending: Student in an Organized Health Care Education/Training Program | Admitting: Student in an Organized Health Care Education/Training Program

## 2020-04-25 ENCOUNTER — Ambulatory Visit (HOSPITAL_BASED_OUTPATIENT_CLINIC_OR_DEPARTMENT_OTHER): Payer: Medicare HMO | Admitting: Student in an Organized Health Care Education/Training Program

## 2020-04-25 ENCOUNTER — Other Ambulatory Visit: Payer: Self-pay

## 2020-04-25 VITALS — BP 156/91 | HR 71 | Temp 97.8°F | Resp 18 | Ht 76.0 in | Wt 200.0 lb

## 2020-04-25 DIAGNOSIS — M5414 Radiculopathy, thoracic region: Secondary | ICD-10-CM

## 2020-04-25 MED ORDER — SODIUM CHLORIDE 0.9% FLUSH
2.0000 mL | Freq: Once | INTRAVENOUS | Status: AC
  Administered 2020-04-25: 10 mL

## 2020-04-25 MED ORDER — ROPIVACAINE HCL 2 MG/ML IJ SOLN
INTRAMUSCULAR | Status: AC
  Filled 2020-04-25: qty 10

## 2020-04-25 MED ORDER — ROPIVACAINE HCL 2 MG/ML IJ SOLN
2.0000 mL | Freq: Once | INTRAMUSCULAR | Status: AC
  Administered 2020-04-25: 10 mL via EPIDURAL

## 2020-04-25 MED ORDER — IOHEXOL 180 MG/ML  SOLN
INTRAMUSCULAR | Status: AC
  Filled 2020-04-25: qty 20

## 2020-04-25 MED ORDER — DEXAMETHASONE SODIUM PHOSPHATE 10 MG/ML IJ SOLN
10.0000 mg | Freq: Once | INTRAMUSCULAR | Status: AC
  Administered 2020-04-25: 10 mg

## 2020-04-25 MED ORDER — LIDOCAINE HCL 2 % IJ SOLN
INTRAMUSCULAR | Status: AC
  Filled 2020-04-25: qty 20

## 2020-04-25 MED ORDER — DEXAMETHASONE SODIUM PHOSPHATE 10 MG/ML IJ SOLN
INTRAMUSCULAR | Status: AC
  Filled 2020-04-25: qty 1

## 2020-04-25 MED ORDER — IOHEXOL 180 MG/ML  SOLN
10.0000 mL | Freq: Once | INTRAMUSCULAR | Status: AC
  Administered 2020-04-25: 10 mL via EPIDURAL

## 2020-04-25 MED ORDER — LIDOCAINE HCL 2 % IJ SOLN
20.0000 mL | Freq: Once | INTRAMUSCULAR | Status: AC
  Administered 2020-04-25: 400 mg

## 2020-04-25 MED ORDER — SODIUM CHLORIDE (PF) 0.9 % IJ SOLN
INTRAMUSCULAR | Status: AC
  Filled 2020-04-25: qty 10

## 2020-04-25 NOTE — Patient Instructions (Signed)

## 2020-04-25 NOTE — Progress Notes (Signed)
PROVIDER NOTE: Information contained herein reflects review and annotations entered in association with encounter. Interpretation of such information and data should be left to medically-trained personnel. Information provided to patient can be located elsewhere in the medical record under "Patient Instructions". Document created using STT-dictation technology, any transcriptional errors that may result from process are unintentional.    Patient: Harold Sandoval  Service Category: Procedure  Provider: Gillis Santa, MD  DOB: 1941/08/30  DOS: 04/25/2020  Location: [facility]  MRN: [id number]  Setting: Ambulatory - outpatient  Referring Provider: Baxter Hire, MD  Type: Established Patient  Specialty: Interventional Pain Management  PCP: Baxter Hire, MD   Primary Reason for Visit: Interventional Pain Management Treatment. CC: Shoulder Pain (right)  Procedure:          Anesthesia, Analgesia, Anxiolysis:  Type: Diagnostic Inter-Laminar Thoracic Epidural Block  #1  Region: Posterior Thoracolumbar Level: T1-2 Laterality: Right-Sided       Type: Local Anesthesia  Local Anesthetic: Lidocaine 1-2%  Position: Prone   Indications: 1. Radicular pain of thoracic region   2. Thoracic radiculopathy (T2/3)    Pain Score: Pre-procedure: 8 /10 Post-procedure: 8 /10   Patient stopped Eliquis 3 days prior.  Pre-op Assessment:  Harold Sandoval is a 79 y.o. (year old), male patient, seen today for interventional treatment. 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 Harold Sandoval has a current medication list which includes the following  prescription(s): apixaban, atorvastatin, brimonidine, brinzolamide, vitamin d3, claritin, desonide, fluticasone, gabapentin, latanoprost, metformin, tramadol, and linaclotide. His primarily concern today is the Shoulder Pain (right)  Initial Vital Signs:  Pulse/HCG Rate: 71ECG Heart Rate: 80 Temp: 97.8 F (36.6 C) Resp: 18 BP: 128/81 SpO2: 98 %  BMI: Estimated body mass index is 24.34 kg/m as calculated from the following:   Height as of this encounter: 6\' 4"  (1.93 m).   Weight as of this encounter: 200 lb (90.7 kg).  Risk Assessment: Allergies: Reviewed. He has No Known Allergies.  Allergy Precautions: None required Coagulopathies: Reviewed. None identified.  Blood-thinner therapy: None at this time Active Infection(s): Reviewed. None identified. Harold Sandoval is afebrile  Site Confirmation: Harold Sandoval was asked to confirm the procedure and laterality before marking the site Procedure checklist: Completed Consent: Before the procedure and under the influence of no sedative(s), amnesic(s), or anxiolytics, the patient was informed of the treatment options, risks and possible complications. To fulfill our ethical and legal obligations, as recommended by the American Medical Association's Code of Ethics, I have informed the patient of my clinical impression; the nature and purpose of the treatment or procedure; the risks, benefits, and possible complications of the intervention; the alternatives, including doing nothing; the risk(s) and benefit(s) of the alternative treatment(s) or procedure(s); and the risk(s) and benefit(s) of doing nothing. The patient was provided information about the general risks and possible complications associated with the procedure. These may include, but are not limited to: failure to achieve desired goals, infection, bleeding, organ or nerve damage, allergic reactions, paralysis, and death. In addition, the patient was informed of those risks and complications  associated to Spine-related procedures, such as failure to decrease pain; infection (i.e.: Meningitis, epidural or intraspinal abscess); bleeding (i.e.: epidural hematoma, subarachnoid hemorrhage, or any other type  of intraspinal or peri-dural bleeding); organ or nerve damage (i.e.: Any type of peripheral nerve, nerve root, or spinal cord injury) with subsequent damage to sensory, motor, and/or autonomic systems, resulting in permanent pain, numbness, and/or weakness of one or several areas of the body; allergic reactions; (i.e.: anaphylactic reaction); and/or death. Furthermore, the patient was informed of those risks and complications associated with the medications. These include, but are not limited to: allergic reactions (i.e.: anaphylactic or anaphylactoid reaction(s)); adrenal axis suppression; blood sugar elevation that in diabetics may result in ketoacidosis or comma; water retention that in patients with history of congestive heart failure may result in shortness of breath, pulmonary edema, and decompensation with resultant heart failure; weight gain; swelling or edema; medication-induced neural toxicity; particulate matter embolism and blood vessel occlusion with resultant organ, and/or nervous system infarction; and/or aseptic necrosis of one or more joints. Finally, the patient was informed that Medicine is not an exact science; therefore, there is also the possibility of unforeseen or unpredictable risks and/or possible complications that may result in a catastrophic outcome. The patient indicated having understood very clearly. We have given the patient no guarantees and we have made no promises. Enough time was given to the patient to ask questions, all of which were answered to the patient's satisfaction. Harold Sandoval has indicated that he wanted to continue with the procedure. Attestation: I, the ordering provider, attest that I have discussed with the patient the benefits, risks, side-effects,  alternatives, likelihood of achieving goals, and potential problems during recovery for the procedure that I have provided informed consent. Date  Time: 04/25/2020  9:57 AM  Pre-Procedure Preparation:  Monitoring: As per clinic protocol. Respiration, ETCO2, SpO2, BP, heart rate and rhythm monitor placed and checked for adequate function Safety Precautions: Patient was assessed for positional comfort and pressure points before starting the procedure. Time-out: I initiated and conducted the "Time-out" before starting the procedure, as per protocol. The patient was asked to participate by confirming the accuracy of the "Time Out" information. Verification of the correct person, site, and procedure were performed and confirmed by me, the nursing staff, and the patient. "Time-out" conducted as per Joint Commission's Universal Protocol (UP.01.01.01). Time: 1030  Description of Procedure:          Target Area: For Epidural Steroid injection(s), the target area is the  interlaminar space, initially targeting the lower border of the superior vertebral body lamina. Approach: Interlaminar approach. Area Prepped: Entire Posterior Thoracolumbar Region DuraPrep (Iodine Povacrylex [0.7% available iodine] and Isopropyl Alcohol, 74% w/w) Safety Precautions: Aspiration looking for blood return was conducted prior to all injections. At no point did we inject any substances, as a needle was being advanced. No attempts were made at seeking any paresthesias. Safe injection practices and needle disposal techniques used. Medications properly checked for expiration dates. SDV (single dose vial) medications used. Description of the Procedure: Protocol guidelines were followed. The patient was placed in position over the fluoroscopy table. The target area was identified and the area prepped in the usual manner. Skin & deeper tissues infiltrated with local anesthetic. Appropriate amount of time allowed to pass for local  anesthetics to take effect. The procedure needles were then advanced to the target area. The inferior aspect of the superior lamina was contacted and the needle walked caudad, until the lamina was cleared. The epidural space was identified using "loss-of-resistance technique" with 0.9% PF-NSS (2-69mL), in a low friction 10cc LOR glass syringe. Proper needle placement was secured. Negative aspiration confirmed.  Solution injected in intermittent fashion, asking for systemic symptoms every 0.5 cc of injectate. The needles were then removed and the area cleansed, making sure to leave some of the prepping solution behind to take advantage of its long term bactericidal properties. Vitals:   04/25/20 1001 04/25/20 1032 04/25/20 1037 04/25/20 1041  BP: 128/81 (!) 156/95 (!) 156/91 (!) (P) 148/92  Pulse: 71     Resp:  18 18 (P) 20  Temp: 97.8 F (36.6 C)     SpO2: 98% 100% 99% (P) 99%  Weight: 200 lb (90.7 kg)     Height: 6\' 4"  (1.93 m)       Start Time: 1030 hrs. End Time: 1041 hrs. Materials:  Needle(s) Type: Epidural needle Gauge: 22G Length: 3.5-in Medication(s): Please see orders for medications and dosing details. 6 cc solution made of 3 cc of preservative-free saline, 2 cc of 0.2% ropivacaine, 1 cc of Decadron 10 mg/cc.  Imaging Guidance (Spinal):          Type of Imaging Technique: Fluoroscopy Guidance (Spinal) Indication(s): Assistance in needle guidance and placement for procedures requiring needle placement in or near specific anatomical locations not easily accessible without such assistance. Exposure Time: Please see nurses notes. Contrast: Before injecting any contrast, we confirmed that the patient did not have an allergy to iodine, shellfish, or radiological contrast. Once satisfactory needle placement was completed at the desired level, radiological contrast was injected. Contrast injected under live fluoroscopy. No contrast complications. See chart for type and volume of contrast  used. Fluoroscopic Guidance: I was personally present during the use of fluoroscopy. "Tunnel Vision Technique" used to obtain the best possible view of the target area. Parallax error corrected before commencing the procedure. "Direction-depth-direction" technique used to introduce the needle under continuous pulsed fluoroscopy. Once target was reached, antero-posterior, oblique, and lateral fluoroscopic projection used confirm needle placement in all planes. Images permanently stored in EMR. Interpretation: I personally interpreted the imaging intraoperatively. Adequate needle placement confirmed in multiple planes. Appropriate spread of contrast into desired area was observed. No evidence of afferent or efferent intravascular uptake. No intrathecal or subarachnoid spread observed. Permanent images saved into the patient's record.  Antibiotic Prophylaxis:   Anti-infectives (From admission, onward)   None     Indication(s): None identified  Post-operative Assessment:  Post-procedure Vital Signs:  Pulse/HCG Rate: 71(P) 82 Temp: 97.8 F (36.6 C) Resp: (P) 20 BP: (!) (P) 148/92 SpO2: (P) 99 %  EBL: None  Complications: No immediate post-treatment complications observed by team, or reported by patient.  Note: The patient tolerated the entire procedure well. A repeat set of vitals were taken after the procedure and the patient was kept under observation following institutional policy, for this type of procedure. Post-procedural neurological assessment was performed, showing return to baseline, prior to discharge. The patient was provided with post-procedure discharge instructions, including a section on how to identify potential problems. Should any problems arise concerning this procedure, the patient was given instructions to immediately contact , at any time, without hesitation. In any case, we plan to contact the patient by telephone for a follow-up status report regarding this  interventional procedure.  Comments:  No additional relevant information.  Plan of Care  Patient instructed to restart Eliquis tomorrow so long as he is not having any upper extremity weakness.  Orders:  Orders Placed This Encounter  Procedures  . DG PAIN CLINIC C-ARM 1-60 MIN NO REPORT    Intraoperative interpretation by procedural physician at [facility].    Standing Status:   Standing    Number of Occurrences:   1    Order Specific Question:   Reason for exam:    Answer:   Assistance in needle guidance and placement for procedures requiring needle placement in or near specific anatomical locations not easily accessible without such assistance.    Medications ordered for procedure: Meds ordered this encounter  Medications  . iohexol (OMNIPAQUE) 180 MG/ML injection 10 mL    Must be Myelogram-compatible. If not available, you may substitute with a water-soluble, non-ionic, hypoallergenic, myelogram-compatible radiological contrast medium.  Marland Kitchen lidocaine (XYLOCAINE) 2 % (with pres) injection 400 mg  . ropivacaine (PF) 2 mg/mL (0.2%) (NAROPIN) injection 2 mL  . sodium chloride flush (NS) 0.9 % injection 2 mL  . dexamethasone (DECADRON) injection 10 mg   Medications administered: We administered iohexol, lidocaine, ropivacaine (PF) 2 mg/mL (0.2%), sodium chloride flush, and dexamethasone.  See the medical record for exact dosing, route, and time of administration.  Follow-up plan:   Return in about 4 weeks (around 05/23/2020) for Post Procedure Evaluation, in person.      Recent Visits Date Type Provider Dept  02/27/20 Office Visit Edward Jolly, MD [facility]  Showing recent visits within past 90 days and meeting all other requirements   Today's Visits Date Type Provider Dept  04/25/20 Procedure visit Edward Jolly, MD [facility]  Showing today's visits and meeting all other requirements   Future Appointments Date Type Provider Dept   05/23/20 Appointment Edward Jolly, MD [facility]  Showing future appointments within next 90 days and meeting all other requirements   Disposition: Discharge home  Discharge (Date  Time): 04/25/2020;   hrs.   Primary Care Physician: Gracelyn Nurse, MD Location: [facility] Note by: Edward Jolly, MD Date: 04/25/2020; Time: 11:43 AM  Disclaimer:  Medicine is not an exact science. The only guarantee in medicine is that nothing is guaranteed. It is important to note that the decision to proceed with this intervention was based on the information collected from the patient. The Data and conclusions were drawn from the patient's questionnaire, the interview, and the physical examination. Because the information was provided in large part by the patient, it cannot be guaranteed that it has not been purposely or unconsciously manipulated. Every effort has been made to obtain as much relevant data as possible for this evaluation. It is important to note that the conclusions that lead to this procedure are derived in large part from the available data. Always take into account that the treatment will also be dependent on availability of resources and existing treatment guidelines, considered by other Pain Management Practitioners as being common knowledge and practice, at the time of the intervention. For Medico-Legal purposes, it is also important to point out that variation in procedural techniques and pharmacological choices are the acceptable norm. The indications, contraindications, technique, and results of the above procedure should only be interpreted and judged by a Board-Certified Interventional Pain Specialist with extensive familiarity and expertise in the same exact procedure and technique.

## 2020-04-25 NOTE — Progress Notes (Signed)
Safety precautions to be maintained throughout the outpatient stay will include: orient to surroundings, keep bed in low position, maintain call bell within reach at all times, provide assistance with transfer out of bed and ambulation.  

## 2020-04-26 ENCOUNTER — Telehealth: Payer: Self-pay | Admitting: *Deleted

## 2020-04-26 NOTE — Telephone Encounter (Signed)
Spoke with Kara Mead, no problems post procedure.

## 2020-05-23 ENCOUNTER — Other Ambulatory Visit: Payer: Self-pay

## 2020-05-23 ENCOUNTER — Ambulatory Visit
Payer: Medicare HMO | Attending: Student in an Organized Health Care Education/Training Program | Admitting: Student in an Organized Health Care Education/Training Program

## 2020-05-23 ENCOUNTER — Encounter: Payer: Self-pay | Admitting: Student in an Organized Health Care Education/Training Program

## 2020-05-23 VITALS — BP 138/80 | HR 83 | Temp 98.4°F | Resp 18 | Ht 76.0 in | Wt 200.0 lb

## 2020-05-23 DIAGNOSIS — M5414 Radiculopathy, thoracic region: Secondary | ICD-10-CM | POA: Insufficient documentation

## 2020-05-23 DIAGNOSIS — G894 Chronic pain syndrome: Secondary | ICD-10-CM

## 2020-05-23 NOTE — Progress Notes (Signed)
PROVIDER NOTE: Information contained herein reflects review and annotations entered in association with encounter. Interpretation of such information and data should be left to medically-trained personnel. Information provided to patient can be located elsewhere in the medical record under "Patient Instructions". Document created using STT-dictation technology, any transcriptional errors that may result from process are unintentional.    Patient: Harold Sandoval  Service Category: E/M  Provider: Gillis Santa, MD  DOB: 1941/05/15  DOS: 05/23/2020  Specialty: Interventional Pain Management  MRN: [id number]  Setting: Ambulatory outpatient  PCP: Baxter Hire, MD  Type: Established Patient    Referring Provider: Baxter Hire, MD  Location: Office  Delivery: Face-to-face     HPI  Reason for encounter: [name], a 79 y.o. year old male, is here today for evaluation and management of his Thoracic radiculopathy [M54.14]. Harold Sandoval primary complain today is Arm Pain (right) and Hand Pain (right middle finger ) Last encounter: Practice (04/26/2020). My last encounter with him was on 04/25/2020. Pertinent problems: Harold Sandoval has Radicular pain of thoracic region; Osteoarthritis of left hip; Primary osteoarthritis of left hip; Degenerative arthritis of left knee; Primary osteoarthritis of left knee; Acute CVA (cerebrovascular accident) ([place]); and Chronic pain syndrome on their pertinent problem list. Pain Assessment: Severity of Chronic pain is reported as a 7 /10. Location: Arm Right/radiates from right shoulder down to middle and 4th finger. Onset: More than a month ago. Quality: Tingling, Aching. Timing: Intermittent. Modifying factor(s): nothing. Vitals:  height is 6' 4"  (1.93 m) and weight is 200 lb (90.7 kg). His temperature is 98.4 F (36.9 C). His blood pressure is 138/80 and his pulse is 83. His respiration is 18 and oxygen saturation is 99%.   Harold Sandoval presents today for  postprocedural evaluation after his T1-T2 ESI. He states that the radiating pain around his thoracic region has greatly reduced and still continues to be fairly low. However he still continues to endorse pain, numbness, tingling along his arm and forearm that radiates into his right middle and ring finger. He states that the numbness and pain improved for the first week in his right forearm and hand and then gradually returned. Patient endorses approximately 100% pain relief for the first week which reduced to approximately 50% by the second week and has gradually returned to baseline thereafter.  Post-Procedure Evaluation  Procedure:   Type: Diagnostic Inter-Laminar Thoracic Epidural Block  #1  Region: Posterior Thoracolumbar Level: T1-2 Laterality: Right-Sided       Sedation: Please see nurses note.  Effectiveness during initial hour after procedure(Ultra-Short Term Relief): 100 %   Local anesthetic used: Long-acting (4-6 hours) Effectiveness: Defined as any analgesic benefit obtained secondary to the administration of local anesthetics. This carries significant diagnostic value as to the etiological location, or anatomical origin, of the pain. Duration of benefit is expected to coincide with the duration of the local anesthetic used.  Effectiveness during initial 4-6 hours after procedure(Short-Term Relief): 100 %  Long-term benefit: Defined as any relief past the pharmacologic duration of the local anesthetics.  Effectiveness past the initial 6 hours after procedure(Long-Term Relief): 100 % (lasting 1 week then gradually started back)   Current benefits: Defined as benefit that persist at this time.   Analgesia:  <50% better Function: Back to baseline ROM: Back to baseline   ROS  Constitutional: Denies any fever or chills Gastrointestinal: No reported hemesis, hematochezia, vomiting, or acute GI distress Musculoskeletal: Denies any acute onset joint swelling, redness, loss of  ROM, or  weakness Neurological: No reported episodes of acute onset apraxia, aphasia, dysarthria, agnosia, amnesia, paralysis, loss of coordination, or loss of consciousness  Medication Review  Vitamin D3, apixaban, atorvastatin, brimonidine, brinzolamide, desonide, fluticasone, gabapentin, latanoprost, linaclotide, loratadine, metFORMIN, and traMADol  History Review  Allergy: Harold Sandoval has No Known Allergies. Drug: Harold Sandoval  reports no history of drug use. Alcohol:  reports no history of alcohol use. Tobacco:  reports that he has never smoked. He has never used smokeless tobacco. Social: Harold Sandoval  reports that he has never smoked. He has never used smokeless tobacco. He reports that he does not drink alcohol and does not use drugs. 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 Family: family history includes Cancer in his father and mother.  Laboratory Chemistry Profile   Renal Lab Results  Component Value Date   BUN 11 02/26/2019   CREATININE 0.79 02/26/2019   GFRAA >60 02/26/2019   GFRNONAA >60 02/26/2019     Hepatic Lab Results  Component Value Date   AST 20 02/26/2019   ALT 18 02/26/2019    ALBUMIN 4.1 02/26/2019   ALKPHOS 79 02/26/2019   AMMONIA 9 09/06/2018     Electrolytes Lab Results  Component Value Date   NA 139 02/26/2019   K 3.5 02/26/2019   CL 102 02/26/2019   CALCIUM 9.2 02/26/2019     Bone No results found for: VD25OH, VD125OH2TOT, [id number], OT1572IO0, 25OHVITD1, 25OHVITD2, 25OHVITD3, TESTOFREE, TESTOSTERONE   Inflammation (CRP: Acute Phase) (ESR: Chronic Phase) No results found for: CRP, ESRSEDRATE, LATICACIDVEN     Note: Above Lab results reviewed.  Recent Imaging Review  DG PAIN CLIN[facility] C-ARM 1-60 MIN NO REPORT Fluoro was used, but no Radiologist interpretation will be provided.  Please refer to "NOTES" tab for provider progress note. Note: Reviewed        Physical Exam  General appearance: Well nourished, well developed, and well hydrated. In no apparent acute distress Mental status: Alert, oriented x 3 (person, place, & time)       Respiratory: No evidence of acute respiratory distress Eyes: PERLA Vitals: BP 138/80    Pulse 83    Temp 98.4 F (36.9 C)    Resp 18    Ht 6' 4"  (1.93 m)    Wt 200 lb (90.7 kg)    SpO2 99%    BMI 24.34 kg/m  BMI: Estimated body mass index is 24.34 kg/m as calculated from the following:   Height as of this encounter: 6' 4"  (1.93 m).   Weight as of this encounter: 200 lb (90.7 kg). Ideal: Ideal body weight: 86.8 kg (191 lb 5.7 oz) Adjusted ideal body weight: 88.4 kg (194 lb 13 oz)  Thoracic Spine Area Exam  Skin & Axial Inspection: No masses, redness, or swelling Alignment: Symmetrical Functional ROM: Slightly improved after treatment Stability: No instability detected Muscle Tone/Strength: Functionally intact. No obvious neuro-muscular anomalies detected.  Sensory (Neurological): Neurogenic Muscle strength & Tone: No palpable anomalies  Upper Extremity (UE) Exam    Side: Right upper extremity  Side: Left upper extremity  Skin & Extremity Inspection: Skin color, temperature, and hair growth are WNL. No  peripheral edema or cyanosis. No masses, redness, swelling, asymmetry, or associated skin lesions. No contractures.  Skin & Extremity Inspection: Skin color, temperature, and hair growth are WNL. No peripheral edema or cyanosis. No masses, redness, swelling, asymmetry, or associated skin lesions. No contractures.  Functional ROM: Unrestricted ROM          Functional ROM: Unrestricted ROM          Muscle Tone/Strength: Functionally intact. No obvious neuro-muscular anomalies detected.  Muscle Tone/Strength: Functionally intact. No obvious neuro-muscular anomalies detected.  Sensory (Neurological): Dermatomal pain pattern affecting the wrist and hand  Sensory (Neurological): Unimpaired          Palpation: No palpable anomalies              Palpation: No palpable anomalies              Provocative Test(s):  Phalen's test: deferred Tinel's test: deferred Apley's scratch test (touch opposite shoulder):  Action 1 (Across chest): deferred Action 2 (Overhead): deferred Action 3 (LB reach): deferred   Provocative Test(s):  Phalen's test: deferred Tinel's test: deferred Apley's scratch test (touch opposite shoulder):  Action 1 (Across chest): deferred Action 2 (Overhead): deferred Action 3 (LB reach): deferred     Assessment   Status Diagnosis  Responding Responding Persistent 1. Thoracic radiculopathy (T2/3)   2. Radicular pain of thoracic region   3. Chronic pain syndrome      Updated Problems: Problem  Chronic Pain Syndrome  Acute Cva (Cerebrovascular Accident) ([facility])  Primary Osteoarthritis of Left Knee  Degenerative Arthritis of Left Knee  Primary Osteoarthritis of Left Hip  Osteoarthritis of Left Hip  Radicular Pain of Thoracic Region    Plan of Care  Problem-specific:  Radicular pain of thoracic region Status post right T1-T2 epidural steroid injection on 04/25/2020 which helped out with his radiating thoracic pain as well as his right arm and forearm pain for approximately 1  week with gradual return of pain thereafter. Recommend repeat T1-T2 ESI #2 plan to increase epidural volume with next injection with hopes of obtaining better analgesic benefit. Risks and benefits reviewed and patient would like to proceed.  Harold Sandoval has a current medication list which includes the following long-term medication(s): apixaban, atorvastatin, claritin, linaclotide, and metformin.  Patient instructed to stop apixaban 3 days prior to scheduled procedure.  Orders:  Orders Placed This Encounter  Procedures   Thoracic Epidural Injection    Standing Status:   Future    Standing Expiration Date:   06/22/2020    Scheduling Instructions:     T1-T2 ESI #2 without sedation,      Stop Eliquis 3 days prior    Order Specific Question:   Where will this procedure be performed?    Answer:   [facility]   Follow-up plan:   Return in about 1 week (around 05/30/2020) for T1-T2 ESI #2 w/o sedation (stop Eliquis 3 days prior).   Recent Visits Date Type Provider Dept  04/25/20 Procedure visit Gillis Santa, MD [facility]  02/27/20 Office Visit Gillis Santa, MD [facility]  Showing recent visits within past 90 days and meeting all other requirements Today's Visits Date Type Provider Dept  05/23/20 Office Visit Gillis Santa,  MD [facility]  Showing today's visits and meeting all other requirements Future Appointments No visits were found meeting these conditions. Showing future appointments within next 90 days and meeting all other requirements  I discussed the assessment and treatment plan with the patient. The patient was provided an opportunity to ask questions and all were answered. The patient agreed with the plan and demonstrated an understanding of the instructions.  Patient advised to call back or seek an in-person evaluation if the symptoms or condition worsens.  Duration of encounter: 30 minutes.  Note by: Gillis Santa,  MD Date: 05/23/2020; Time: 3:14 PM

## 2020-05-23 NOTE — Progress Notes (Signed)
Safety precautions to be maintained throughout the outpatient stay will include: orient to surroundings, keep bed in low position, maintain call bell within reach at all times, provide assistance with transfer out of bed and ambulation.  

## 2020-05-23 NOTE — Assessment & Plan Note (Addendum)
Status post right T1-T2 epidural steroid injection on 04/25/2020 which helped out with his radiating thoracic pain as well as his right arm and forearm pain for approximately 1 week with gradual return of pain thereafter. Recommend repeat T1-T2 ESI #2 plan to increase epidural volume with next injection with hopes of obtaining better analgesic benefit. Risks and benefits reviewed and patient would like to proceed.

## 2020-05-23 NOTE — Patient Instructions (Addendum)
Preparing for your procedure (without sedation) Instructions: . Oral Intake: Do not eat or drink anything for at least 3 hours prior to your procedure. . Transportation: Unless otherwise stated by your physician, you may drive yourself after the procedure. . Blood Pressure Medicine: Take your blood pressure medicine with a sip of water the morning of the procedure. . Insulin: Take only  of your normal insulin dose. . Preventing infections: Shower with an antibacterial soap the morning of your procedure. . Build-up your immune system: Take 1000 mg of Vitamin C with every meal (3 times a day) the day prior to your procedure. . Pregnancy: If you are pregnant, call and cancel the procedure. . Sickness: If you have a cold, fever, or any active infections, call and cancel the procedure. . Arrival: You must be in the facility at least 30 minutes prior to your scheduled procedure. . Children: Do not bring any children with you. . Dress appropriately: Bring dark clothing that you would not mind if they get stained. . Valuables: Do not bring any jewelry or valuables. Procedure appointments are reserved for interventional treatments only. Marland Kitchen No Prescription Refills. . No medication changes will be discussed during procedure appointments. . No disability issues will be discussed.   Stop eliquis for 3 days prior to procedure

## 2020-05-30 ENCOUNTER — Ambulatory Visit
Admission: RE | Admit: 2020-05-30 | Discharge: 2020-05-30 | Disposition: A | Discharge status: Home / Self Care | Payer: Medicare HMO | Source: Ambulatory Visit | Attending: Student in an Organized Health Care Education/Training Program | Admitting: Student in an Organized Health Care Education/Training Program

## 2020-05-30 ENCOUNTER — Ambulatory Visit (HOSPITAL_BASED_OUTPATIENT_CLINIC_OR_DEPARTMENT_OTHER): Payer: Medicare HMO | Admitting: Student in an Organized Health Care Education/Training Program

## 2020-05-30 ENCOUNTER — Other Ambulatory Visit: Payer: Self-pay

## 2020-05-30 ENCOUNTER — Encounter: Payer: Self-pay | Admitting: Student in an Organized Health Care Education/Training Program

## 2020-05-30 VITALS — BP 150/95 | HR 91 | Temp 97.3°F | Resp 16 | Ht 76.0 in | Wt 204.0 lb

## 2020-05-30 DIAGNOSIS — Z9889 Other specified postprocedural states: Secondary | ICD-10-CM | POA: Insufficient documentation

## 2020-05-30 DIAGNOSIS — Z7901 Long term (current) use of anticoagulants: Secondary | ICD-10-CM | POA: Insufficient documentation

## 2020-05-30 DIAGNOSIS — M79601 Pain in right arm: Secondary | ICD-10-CM | POA: Diagnosis not present

## 2020-05-30 DIAGNOSIS — Z79899 Other long term (current) drug therapy: Secondary | ICD-10-CM | POA: Insufficient documentation

## 2020-05-30 DIAGNOSIS — G894 Chronic pain syndrome: Secondary | ICD-10-CM | POA: Insufficient documentation

## 2020-05-30 DIAGNOSIS — M5414 Radiculopathy, thoracic region: Secondary | ICD-10-CM | POA: Insufficient documentation

## 2020-05-30 MED ORDER — SODIUM CHLORIDE 0.9% FLUSH
1.0000 mL | Freq: Once | INTRAVENOUS | Status: AC
  Administered 2020-05-30: 1 mL

## 2020-05-30 MED ORDER — DEXAMETHASONE SODIUM PHOSPHATE 10 MG/ML IJ SOLN
10.0000 mg | Freq: Once | INTRAMUSCULAR | Status: AC
  Administered 2020-05-30: 10 mg
  Filled 2020-05-30: qty 1

## 2020-05-30 MED ORDER — IOHEXOL 180 MG/ML  SOLN
10.0000 mL | Freq: Once | INTRAMUSCULAR | Status: AC
  Administered 2020-05-30: 20 mL via EPIDURAL

## 2020-05-30 MED ORDER — ROPIVACAINE HCL 2 MG/ML IJ SOLN
1.0000 mL | Freq: Once | INTRAMUSCULAR | Status: AC
  Administered 2020-05-30: 1 mL via EPIDURAL
  Filled 2020-05-30: qty 10

## 2020-05-30 MED ORDER — LIDOCAINE HCL 2 % IJ SOLN
20.0000 mL | Freq: Once | INTRAMUSCULAR | Status: AC
  Administered 2020-05-30: 400 mg

## 2020-05-30 NOTE — Patient Instructions (Signed)
Pain Management Discharge Instructions  General Discharge Instructions :  If you need to reach your doctor call: Monday-Friday 8:00 am - 4:00 pm at [phone] or toll free [phone].  After clinic hours [phone] to have operator reach doctor.  Bring all of your medication bottles to all your appointments in the pain clinic.  To cancel or reschedule your appointment with Pain Management please remember to call 24 hours in advance to avoid a fee.  Refer to the educational materials which you have been given on: General Risks, I had my Procedure. Discharge Instructions, Post Sedation.  Post Procedure Instructions:  The drugs you were given will stay in your system until tomorrow, so for the next 24 hours you should not drive, make any legal decisions or drink any alcoholic beverages.  You may eat anything you prefer, but it is better to start with liquids then soups and crackers, and gradually work up to solid foods.  Please notify your doctor immediately if you have any unusual bleeding, trouble breathing or pain that is not related to your normal pain.  Depending on the type of procedure that was done, some parts of your body may feel week and/or numb.  This usually clears up by tonight or the next day.  Walk with the use of an assistive device or accompanied by an adult for the 24 hours.  You may use ice on the affected area for the first 24 hours.  Put ice in a Ziploc bag and cover with a towel and place against area 15 minutes on 15 minutes off.  You may switch to heat after 24 hours.Epidural Steroid Injection Patient Information  Description: The epidural space surrounds the nerves as they exit the spinal cord.  In some patients, the nerves can be compressed and inflamed by a bulging disc or a tight spinal canal (spinal stenosis).  By injecting steroids into the epidural space, we can bring irritated nerves into direct contact with a potentially helpful medication.  These  steroids act directly on the irritated nerves and can reduce swelling and inflammation which often leads to decreased pain.  Epidural steroids may be injected anywhere along the spine and from the neck to the low back depending upon the location of your pain.   After numbing the skin with local anesthetic (like Novocaine), a small needle is passed into the epidural space slowly.  You may experience a sensation of pressure while this is being done.  The entire block usually last less than 10 minutes.  Conditions which may be treated by epidural steroids:   Low back and leg pain  Neck and arm pain  Spinal stenosis  Post-laminectomy syndrome  Herpes zoster (shingles) pain  Pain from compression fractures  Preparation for the injection:  1. Do not eat any solid food or dairy products within 8 hours of your appointment.  2. You may drink clear liquids up to 3 hours before appointment.  Clear liquids include water, black coffee, juice or soda.  No milk or cream please. 3. You may take your regular medication, including pain medications, with a sip of water before your appointment  Diabetics should hold regular insulin (if taken separately) and take 1/2 normal NPH dos the morning of the procedure.  Carry some sugar containing items with you to your appointment. 4. A driver must accompany you and be prepared to drive you home after your procedure.  5. Bring all your current medications with your. 6. An IV may be inserted and   sedation may be given at the discretion of the physician.   7. A blood pressure cuff, EKG and other monitors will often be applied during the procedure.  Some patients may need to have extra oxygen administered for a short period. 8. You will be asked to provide medical information, including your allergies, prior to the procedure.  We must know immediately if you are taking blood thinners (like Coumadin/Warfarin)  Or if you are allergic to IV iodine contrast (dye). We must  know if you could possible be pregnant.  Possible side-effects:  Bleeding from needle site  Infection (rare, may require surgery)  Nerve injury (rare)  Numbness & tingling (temporary)  Difficulty urinating (rare, temporary)  Spinal headache ( a headache worse with upright posture)  Light -headedness (temporary)  Pain at injection site (several days)  Decreased blood pressure (temporary)  Weakness in arm/leg (temporary)  Pressure sensation in back/neck (temporary)  Call if you experience:  Fever/chills associated with headache or increased back/neck pain.  Headache worsened by an upright position.  New onset weakness or numbness of an extremity below the injection site  Hives or difficulty breathing (go to the emergency room)  Inflammation or drainage at the infection site  Severe back/neck pain  Any new symptoms which are concerning to you  Please note:  Although the local anesthetic injected can often make your back or neck feel good for several hours after the injection, the pain will likely return.  It takes 3-7 days for steroids to work in the epidural space.  You may not notice any pain relief for at least that one week.  If effective, we will often do a series of three injections spaced 3-6 weeks apart to maximally decrease your pain.  After the initial series, we generally will wait several months before considering a repeat injection of the same type.  If you have any questions, please call [phone] [facility] 

## 2020-05-30 NOTE — Progress Notes (Signed)
PROVIDER NOTE: Information contained herein reflects review and annotations entered in association with encounter. Interpretation of such information and data should be left to medically-trained personnel. Information provided to patient can be located elsewhere in the medical record under "Patient Instructions". Document created using STT-dictation technology, any transcriptional errors that may result from process are unintentional.    Patient: Harold Sandoval  Service Category: Procedure  Provider: Gillis Santa, MD  DOB: 05-17-1941  DOS: 05/30/2020  Location: [facility]  MRN: [id number]  Setting: Ambulatory - outpatient  Referring Provider: Baxter Hire, MD  Type: Established Patient  Specialty: Interventional Pain Management  PCP: Baxter Hire, MD   Primary Reason for Visit: Interventional Pain Management Treatment. CC: Arm Pain (right)  Procedure:          Anesthesia, Analgesia, Anxiolysis:  Type: Therapeutic Inter-Laminar Thoracic Epidural Block  #2  Region: Posterior Thoracolumbar Level: T1-2 Laterality: Right-Sided       Type: Local Anesthesia  Local Anesthetic: Lidocaine 1-2%  Position: Prone   Indications: 1. Thoracic radiculopathy (T2/3)   2. Radicular pain of thoracic region   3. Chronic pain syndrome    Pain Score: Pre-procedure: 5 /10 Post-procedure: 5 /10   Patient stopped Eliquis 3 days prior.  Pre-op Assessment:  Harold Sandoval is a 79 y.o. (year old), male patient, seen today for interventional treatment. 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 Harold Sandoval has a current medication list which includes  the following prescription(s): apixaban, atorvastatin, brimonidine, brinzolamide, vitamin d3, claritin, desonide, fluticasone, gabapentin, latanoprost, metformin, tramadol, and linaclotide. His primarily concern today is the Arm Pain (right)  Initial Vital Signs:  Pulse/HCG Rate: 91ECG Heart Rate: 99 Temp: (!) 97.3 F (36.3 C) Resp: 18 BP: 123/77 SpO2: 99 %  BMI: Estimated body mass index is 24.83 kg/m as calculated from the following:   Height as of this encounter: 6\' 4"  (1.93 m).   Weight as of this encounter: 204 lb (92.5 kg).  Risk Assessment: Allergies: Reviewed. He has No Known Allergies.  Allergy Precautions: None required Coagulopathies: Reviewed. None identified.  Blood-thinner therapy: None at this time Active Infection(s): Reviewed. None identified. Harold Sandoval is afebrile  Site Confirmation: Harold Sandoval was asked to confirm the procedure and laterality before marking the site Procedure checklist: Completed Consent: Before the procedure and under the influence of no sedative(s), amnesic(s), or anxiolytics, the patient was informed of the treatment options, risks and possible complications. To fulfill our ethical and legal obligations, as recommended by the American Medical Association's Code of Ethics, I have informed the patient of my clinical impression; the nature and purpose of the treatment or procedure; the risks, benefits, and possible complications of the intervention; the alternatives, including doing nothing; the risk(s) and benefit(s) of the alternative treatment(s) or procedure(s); and the risk(s) and benefit(s) of doing nothing. The patient was provided information about the general risks and possible complications associated with the procedure. These may include, but are not limited to: failure to achieve desired goals, infection, bleeding, organ or nerve damage, allergic reactions, paralysis, and death. In addition, the patient was informed of those risks and  complications associated to Spine-related procedures, such as failure to decrease pain; infection (i.e.: Meningitis, epidural or intraspinal abscess); bleeding (i.e.: epidural  hematoma, subarachnoid hemorrhage, or any other type of intraspinal or peri-dural bleeding); organ or nerve damage (i.e.: Any type of peripheral nerve, nerve root, or spinal cord injury) with subsequent damage to sensory, motor, and/or autonomic systems, resulting in permanent pain, numbness, and/or weakness of one or several areas of the body; allergic reactions; (i.e.: anaphylactic reaction); and/or death. Furthermore, the patient was informed of those risks and complications associated with the medications. These include, but are not limited to: allergic reactions (i.e.: anaphylactic or anaphylactoid reaction(s)); adrenal axis suppression; blood sugar elevation that in diabetics may result in ketoacidosis or comma; water retention that in patients with history of congestive heart failure may result in shortness of breath, pulmonary edema, and decompensation with resultant heart failure; weight gain; swelling or edema; medication-induced neural toxicity; particulate matter embolism and blood vessel occlusion with resultant organ, and/or nervous system infarction; and/or aseptic necrosis of one or more joints. Finally, the patient was informed that Medicine is not an exact science; therefore, there is also the possibility of unforeseen or unpredictable risks and/or possible complications that may result in a catastrophic outcome. The patient indicated having understood very clearly. We have given the patient no guarantees and we have made no promises. Enough time was given to the patient to ask questions, all of which were answered to the patient's satisfaction. Harold Sandoval has indicated that he wanted to continue with the procedure. Attestation: I, the ordering provider, attest that I have discussed with the patient the benefits, risks,  side-effects, alternatives, likelihood of achieving goals, and potential problems during recovery for the procedure that I have provided informed consent. Date  Time: 05/30/2020  1:06 PM  Pre-Procedure Preparation:  Monitoring: As per clinic protocol. Respiration, ETCO2, SpO2, BP, heart rate and rhythm monitor placed and checked for adequate function Safety Precautions: Patient was assessed for positional comfort and pressure points before starting the procedure. Time-out: I initiated and conducted the "Time-out" before starting the procedure, as per protocol. The patient was asked to participate by confirming the accuracy of the "Time Out" information. Verification of the correct person, site, and procedure were performed and confirmed by me, the nursing staff, and the patient. "Time-out" conducted as per Joint Commission's Universal Protocol (UP.01.01.01). Time: 1345  Description of Procedure:          Target Area: For Epidural Steroid injection(s), the target area is the  interlaminar space, initially targeting the lower border of the superior vertebral body lamina. Approach: Interlaminar approach. Area Prepped: Entire Posterior Thoracolumbar Region DuraPrep (Iodine Povacrylex [0.7% available iodine] and Isopropyl Alcohol, 74% w/w) Safety Precautions: Aspiration looking for blood return was conducted prior to all injections. At no point did we inject any substances, as a needle was being advanced. No attempts were made at seeking any paresthesias. Safe injection practices and needle disposal techniques used. Medications properly checked for expiration dates. SDV (single dose vial) medications used. Description of the Procedure: Protocol guidelines were followed. The patient was placed in position over the fluoroscopy table. The target area was identified and the area prepped in the usual manner. Skin & deeper tissues infiltrated with local anesthetic. Appropriate amount of time allowed to pass for  local anesthetics to take effect. The procedure needles were then advanced to the target area. The inferior aspect of the superior lamina was contacted and the needle walked caudad, until the lamina was cleared. The epidural space was identified using "loss-of-resistance technique" with 0.9% PF-NSS (2-70mL), in a low friction 10cc LOR glass syringe. Proper  needle placement was secured. Negative aspiration confirmed. Solution injected in intermittent fashion, asking for systemic symptoms every 0.5 cc of injectate. The needles were then removed and the area cleansed, making sure to leave some of the prepping solution behind to take advantage of its long term bactericidal properties. Vitals:   05/30/20 1344 05/30/20 1349 05/30/20 1355 05/30/20 1357  BP: (!) 154/81 (!) 162/95 (!) 155/84 (!) 150/95  Pulse:      Resp: 18 16 18 16   Temp:      SpO2: 100% 99% 99% 99%  Weight:      Height:        Start Time: 1345 hrs. End Time: 1357 hrs. Materials:  Needle(s) Type: Epidural needle Gauge: 22G Length: 3.5-in Medication(s): Please see orders for medications and dosing details. 6 cc solution made of 3 cc of preservative-free saline, 2 cc of 0.2% ropivacaine, 1 cc of Decadron 10 mg/cc.  Imaging Guidance (Spinal):          Type of Imaging Technique: Fluoroscopy Guidance (Spinal) Indication(s): Assistance in needle guidance and placement for procedures requiring needle placement in or near specific anatomical locations not easily accessible without such assistance. Exposure Time: Please see nurses notes. Contrast: Before injecting any contrast, we confirmed that the patient did not have an allergy to iodine, shellfish, or radiological contrast. Once satisfactory needle placement was completed at the desired level, radiological contrast was injected. Contrast injected under live fluoroscopy. No contrast complications. See chart for type and volume of contrast used. Fluoroscopic Guidance: I was personally  present during the use of fluoroscopy. "Tunnel Vision Technique" used to obtain the best possible view of the target area. Parallax error corrected before commencing the procedure. "Direction-depth-direction" technique used to introduce the needle under continuous pulsed fluoroscopy. Once target was reached, antero-posterior, oblique, and lateral fluoroscopic projection used confirm needle placement in all planes. Images permanently stored in EMR. Interpretation: I personally interpreted the imaging intraoperatively. Adequate needle placement confirmed in multiple planes. Appropriate spread of contrast into desired area was observed. No evidence of afferent or efferent intravascular uptake. No intrathecal or subarachnoid spread observed. Permanent images saved into the patient's record.  Antibiotic Prophylaxis:   Anti-infectives (From admission, onward)   None     Indication(s): None identified  Post-operative Assessment:  Post-procedure Vital Signs:  Pulse/HCG Rate: 9188 Temp: (!) 97.3 F (36.3 C) Resp: 16 BP: (!) 150/95 SpO2: 99 %  EBL: None  Complications: No immediate post-treatment complications observed by team, or reported by patient.  Note: The patient tolerated the entire procedure well. A repeat set of vitals were taken after the procedure and the patient was kept under observation following institutional policy, for this type of procedure. Post-procedural neurological assessment was performed, showing return to baseline, prior to discharge. The patient was provided with post-procedure discharge instructions, including a section on how to identify potential problems. Should any problems arise concerning this procedure, the patient was given instructions to immediately contact , at any time, without hesitation. In any case, we plan to contact the patient by telephone for a follow-up status report regarding this interventional procedure.  Comments:  No additional relevant  information.  Plan of Care  Patient instructed to restart Eliquis tomorrow so long as he is not having any upper extremity weakness.  Orders:  Orders Placed This Encounter  Procedures  . [facility] C-ARM 1-60 MIN NO REPORT    Intraoperative interpretation by procedural physician at [facility].    Standing Status:  Standing    Number of Occurrences:   1    Order Specific Question:   Reason for exam:    Answer:   Assistance in needle guidance and placement for procedures requiring needle placement in or near specific anatomical locations not easily accessible without such assistance.    Medications ordered for procedure: Meds ordered this encounter  Medications  . iohexol (OMNIPAQUE) 180 MG/ML injection 10 mL    Must be Myelogram-compatible. If not available, you may substitute with a water-soluble, non-ionic, hypoallergenic, myelogram-compatible radiological contrast medium.  Marland Kitchen lidocaine (XYLOCAINE) 2 % (with pres) injection 400 mg  . ropivacaine (PF) 2 mg/mL (0.2%) (NAROPIN) injection 1 mL  . sodium chloride flush (NS) 0.9 % injection 1 mL  . dexamethasone (DECADRON) injection 10 mg   Medications administered: We administered iohexol, lidocaine, ropivacaine (PF) 2 mg/mL (0.2%), sodium chloride flush, and dexamethasone.  See the medical record for exact dosing, route, and time of administration.  Follow-up plan:   Return in about 5 weeks (around 07/04/2020) for Post Procedure Evaluation.      Recent Visits Date Type Provider Dept  05/23/20 Office Visit Edward Jolly, MD [facility]  04/25/20 Procedure visit Edward Jolly, MD [facility]  Showing recent visits within past 90 days and meeting all other requirements Today's Visits Date Type Provider Dept  05/30/20 Procedure visit Edward Jolly, MD [facility]  Showing today's visits and meeting all other requirements Future Appointments Date Type Provider Dept  07/03/20 Appointment  Edward Jolly, MD [facility]  Showing future appointments within next 90 days and meeting all other requirements  Disposition: Discharge home  Discharge (Date  Time): 05/30/2020; 1410 hrs.   Primary Care Physician: Gracelyn Nurse, MD Location: [facility] Note by: Edward Jolly, MD Date: 05/30/2020; Time: 2:27 PM  Disclaimer:  Medicine is not an exact science. The only guarantee in medicine is that nothing is guaranteed. It is important to note that the decision to proceed with this intervention was based on the information collected from the patient. The Data and conclusions were drawn from the patient's questionnaire, the interview, and the physical examination. Because the information was provided in large part by the patient, it cannot be guaranteed that it has not been purposely or unconsciously manipulated. Every effort has been made to obtain as much relevant data as possible for this evaluation. It is important to note that the conclusions that lead to this procedure are derived in large part from the available data. Always take into account that the treatment will also be dependent on availability of resources and existing treatment guidelines, considered by other Pain Management Practitioners as being common knowledge and practice, at the time of the intervention. For Medico-Legal purposes, it is also important to point out that variation in procedural techniques and pharmacological choices are the acceptable norm. The indications, contraindications, technique, and results of the above procedure should only be interpreted and judged by a Board-Certified Interventional Pain Specialist with extensive familiarity and expertise in the same exact procedure and technique.

## 2020-05-30 NOTE — Progress Notes (Signed)
Safety precautions to be maintained throughout the outpatient stay will include: orient to surroundings, keep bed in low position, maintain call bell within reach at all times, provide assistance with transfer out of bed and ambulation.  

## 2020-07-03 ENCOUNTER — Ambulatory Visit
Payer: Medicare HMO | Attending: Student in an Organized Health Care Education/Training Program | Admitting: Student in an Organized Health Care Education/Training Program

## 2020-07-03 ENCOUNTER — Encounter: Payer: Self-pay | Admitting: Student in an Organized Health Care Education/Training Program

## 2020-07-03 ENCOUNTER — Other Ambulatory Visit: Payer: Self-pay

## 2020-07-03 VITALS — BP 115/87 | HR 86 | Temp 98.4°F | Ht 76.0 in | Wt 204.0 lb

## 2020-07-03 DIAGNOSIS — G588 Other specified mononeuropathies: Secondary | ICD-10-CM | POA: Diagnosis present

## 2020-07-03 DIAGNOSIS — G894 Chronic pain syndrome: Secondary | ICD-10-CM | POA: Diagnosis present

## 2020-07-03 DIAGNOSIS — I639 Cerebral infarction, unspecified: Secondary | ICD-10-CM

## 2020-07-03 DIAGNOSIS — M5414 Radiculopathy, thoracic region: Secondary | ICD-10-CM

## 2020-07-03 DIAGNOSIS — M47894 Other spondylosis, thoracic region: Secondary | ICD-10-CM | POA: Insufficient documentation

## 2020-07-03 NOTE — Progress Notes (Signed)
PROVIDER NOTE: Information contained herein reflects review and annotations entered in association with encounter. Interpretation of such information and data should be left to medically-trained personnel. Information provided to patient can be located elsewhere in the medical record under "Patient Instructions". Document created using STT-dictation technology, any transcriptional errors that may result from process are unintentional.    Patient: Harold Sandoval  Service Category: E/M  Provider: Gillis Santa, MD  DOB: November 02, 1941  DOS: 07/03/2020  Specialty: Interventional Pain Management  MRN: [id number]  Setting: Ambulatory outpatient  PCP: Baxter Hire, MD  Type: Established Patient    Referring Provider: Baxter Hire, MD  Location: Office  Delivery: Face-to-face     HPI  Reason for encounter: Mr. CAESAR MANNELLA, a 79 y.o. year old male, is here today for evaluation and management of his Thoracic radiculopathy [M54.14]. Mr. Hollabaugh primary complain today is Back Pain (upper) Last encounter: Practice (05/30/2020). My last encounter with him was on 05/30/2020. Pertinent problems: Mr. Henriques has Radicular pain of thoracic region; Osteoarthritis of left hip; Primary osteoarthritis of left hip; Degenerative arthritis of left knee; Primary osteoarthritis of left knee; Acute CVA (cerebrovascular accident) ([place]); and Chronic pain syndrome on their pertinent problem list. Pain Assessment: Severity of Chronic pain is reported as a 6 /10. Location: Back Upper/lower back pain radiates into both legs. Onset: More than a month ago. Quality: Dull. Timing:  . Modifying factor(s): Gabapentin. Vitals:  height is 6' 4"  (1.93 m) and weight is 204 lb (92.5 kg) (abnormal). His temporal temperature is 98.4 F (36.9 C). His blood pressure is 115/87 (abnormal) and his pulse is 86. His oxygen saturation is 98%.    Post-Procedure Evaluation  Procedure (05/30/2020):   Type: Therapeutic Inter-Laminar Thoracic Epidural  Block  #2  Region: Posterior Thoracolumbar Level: T1-2 Laterality: Right-Sided    Sedation: Please see nurses note.  Effectiveness during initial hour after procedure(Ultra-Short Term Relief): 90 %   Local anesthetic used: Long-acting (4-6 hours) Effectiveness: Defined as any analgesic benefit obtained secondary to the administration of local anesthetics. This carries significant diagnostic value as to the etiological location, or anatomical origin, of the pain. Duration of benefit is expected to coincide with the duration of the local anesthetic used.  Effectiveness during initial 4-6 hours after procedure(Short-Term Relief): 90 %   Long-term benefit: Defined as any relief past the pharmacologic duration of the local anesthetics.  Effectiveness past the initial 6 hours after procedure(Long-Term Relief): 40 %   Current benefits: Defined as benefit that persist at this time.   Analgesia:  <50% better Function: Somewhat improved ROM: Somewhat improved   ROS  Constitutional: Denies any fever or chills Gastrointestinal: No reported hemesis, hematochezia, vomiting, or acute GI distress Musculoskeletal: Denies any acute onset joint swelling, redness, loss of ROM, or weakness Neurological: Positive for mid back pain, hip pain  Medication Review  Vitamin D3, apixaban, atorvastatin, brimonidine, brinzolamide, desonide, fluticasone, gabapentin, latanoprost, linaclotide, loratadine, metFORMIN, and traMADol  History Review  Allergy: Mr. Krantz is allergic to lactose intolerance (gi). Drug: Mr. Branan  reports no history of drug use. Alcohol:  reports no history of alcohol use. Tobacco:  reports that he has never smoked. He has never used smokeless tobacco. Social: Mr. Wert  reports that he has never smoked. He has never used smokeless tobacco. He reports that he does not drink alcohol and does not use drugs. 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 Surgical: Mr. 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 Family: family history includes Cancer in his father and mother.  Laboratory Chemistry Profile   Renal Lab Results  Component Value Date   BUN 11 02/26/2019   CREATININE 0.79 02/26/2019   GFRAA >60 02/26/2019   GFRNONAA >60 02/26/2019     Hepatic Lab Results  Component Value Date   AST 20 02/26/2019   ALT 18 02/26/2019   ALBUMIN 4.1 02/26/2019   ALKPHOS 79 02/26/2019   AMMONIA 9 09/06/2018     Electrolytes Lab Results  Component Value Date   NA 139 02/26/2019   K 3.5 02/26/2019   CL 102 02/26/2019   CALCIUM 9.2 02/26/2019     Bone No results found for: VD25OH, VD125OH2TOT, YP9509TO6, ZT2458KD9, 25OHVITD1, 25OHVITD2, 25OHVITD3, TESTOFREE, TESTOSTERONE   Inflammation (CRP: Acute Phase) (ESR: Chronic Phase) No results found for: CRP, ESRSEDRATE, LATICACIDVEN     Note: Above Lab results reviewed.  Physical Exam  General appearance: Well nourished, well developed, and well hydrated. In no apparent acute distress Mental status: Alert, oriented x 3 (person, place, & time)       Respiratory: No evidence of acute respiratory distress Eyes: PERLA Vitals: BP (!) 115/87    Pulse 86    Temp 98.4 F (36.9 C) (Temporal)    Ht 6' 4"   (1.93 m)    Wt (!) 204 lb (92.5 kg)    SpO2 98%    BMI 24.83 kg/m  BMI: Estimated body mass index is 24.83 kg/m as calculated from the following:   Height as of this encounter: 6' 4"  (1.93 m).   Weight as of this encounter: 204 lb (92.5 kg). Ideal: Ideal body weight: 86.8 kg (191 lb 5.7 oz) Adjusted ideal body weight: 89.1 kg (196 lb 6.7 oz)  Thoracic Spine Area Exam  Skin & Axial Inspection: No masses, redness, or swelling Alignment: Symmetrical Functional ROM: Slightly improved after treatment Stability: No instability detected Muscle Tone/Strength: Functionally intact. No obvious neuro-muscular anomalies detected. Sensory (Neurological): Neurogenic Muscle strength & Tone: No palpable anomalies  Upper Extremity (UE) Exam    Side: Right upper extremity  Side: Left upper extremity  Skin & Extremity Inspection: Skin color, temperature, and hair growth are WNL. No peripheral edema or cyanosis. No masses, redness, swelling, asymmetry, or associated skin lesions. No contractures.  Skin & Extremity Inspection: Skin color, temperature, and hair growth are WNL. No peripheral edema or cyanosis. No masses, redness, swelling, asymmetry, or associated skin lesions. No contractures.  Functional ROM: Unrestricted ROM          Functional ROM: Unrestricted ROM          Muscle Tone/Strength: Functionally intact. No obvious neuro-muscular anomalies detected.  Muscle Tone/Strength: Functionally intact. No obvious neuro-muscular anomalies detected.  Sensory (Neurological): Dermatomal pain pattern affecting the wrist and hand  Sensory (Neurological): Unimpaired          Palpation: No palpable anomalies              Palpation: No palpable anomalies  Provocative Test(s):  Phalen's test: deferred Tinel's test: deferred Apley's scratch test (touch opposite shoulder):  Action 1 (Across chest): deferred Action 2 (Overhead): deferred Action 3 (LB reach): deferred   Provocative Test(s):   Phalen's test: deferred Tinel's test: deferred Apley's scratch test (touch opposite shoulder):  Action 1 (Across chest): deferred Action 2 (Overhead): deferred Action 3 (LB reach): deferred      Assessment   Status Diagnosis  Responding Responding Persistent 1. Thoracic radiculopathy (T2/3)   2. Radicular pain of thoracic region   3. Acute CVA (cerebrovascular accident) ([place])   4. Intercostal neuralgia   5. Thoracic facet joint syndrome   6. Chronic pain syndrome      Plan of Care   Patient follows up for postprocedural evaluation after his second T1-T2 epidural steroid injection.  Patient endorses greater pain relief with the second injection compared to his first one.  He was able to go to the beach and enjoy time there with his family.  He has seen his previous orthopedic surgeon and has also obtained a second opinion from Dr. Rudene Christians.  It is recommended that he get nerve conduction velocity/EMG study as well as CT myelogram to delineate whether this is coming from a lumbar spine source.  This is reasonable.  We will place as needed order for repeat thoracic epidural steroid injection.  Otherwise we will see patient in approximately 8 weeks after he has completed his EMG/NCV as well as CT myelogram.  Orders:  Orders Placed This Encounter  Procedures   Thoracic Epidural Injection    Fluoroscopy upper back pain and thoracic radicular pain (chest or abdominal)    Standing Status:   Standing    Number of Occurrences:   1    Standing Expiration Date:   07/03/2021    Scheduling Instructions:     Level: TBD     Laterality: TBD     Sedation: Patient's choice.     TIMEFRAME: PRN procedure. (Mr. Flye will call when needed.)    Order Specific Question:   Where will this procedure be performed?    Answer:   [facility]   Follow-up plan:   Return in about 8 weeks (around 08/28/2020) for F.U. after imaging.     Recent Visits Date Type Provider Dept  05/30/20 Procedure  visit Gillis Santa, MD [facility]  05/23/20 Office Visit Gillis Santa, MD [facility]  04/25/20 Procedure visit Gillis Santa, MD [facility]  Showing recent visits within past 90 days and meeting all other requirements Today's Visits Date Type Provider Dept  07/03/20 Office Visit Gillis Santa, MD [facility]  Showing today's visits and meeting all other requirements Future Appointments Date Type Provider Dept  08/30/20 Appointment Gillis Santa, MD [facility]  Showing future appointments within next 90 days and meeting all other requirements  I discussed the assessment and treatment plan with the patient. The patient was provided an opportunity to ask questions and all were answered. The patient agreed with the plan and demonstrated an understanding of the instructions.  Patient advised to call back or seek an in-person evaluation if the symptoms or condition worsens.  Duration of encounter: 54mnutes.  Note by: BGillis Santa MD Date: 07/03/2020; Time: 3:46 PM

## 2020-07-03 NOTE — Progress Notes (Signed)
Safety precautions to be maintained throughout the outpatient stay will include: orient to surroundings, keep bed in low position, maintain call bell within reach at all times, provide assistance with transfer out of bed and ambulation.  

## 2020-08-06 ENCOUNTER — Other Ambulatory Visit: Payer: Self-pay | Admitting: Orthopedic Surgery

## 2020-08-06 DIAGNOSIS — M5432 Sciatica, left side: Secondary | ICD-10-CM

## 2020-08-24 ENCOUNTER — Other Ambulatory Visit: Payer: Self-pay

## 2020-08-24 ENCOUNTER — Ambulatory Visit
Admission: RE | Admit: 2020-08-24 | Discharge: 2020-08-24 | Disposition: A | Discharge status: Home / Self Care | Payer: Medicare HMO | Source: Ambulatory Visit | Attending: Orthopedic Surgery | Admitting: Orthopedic Surgery

## 2020-08-24 DIAGNOSIS — M5432 Sciatica, left side: Secondary | ICD-10-CM | POA: Diagnosis present

## 2020-08-30 ENCOUNTER — Ambulatory Visit: Payer: Medicare HMO | Admitting: Student in an Organized Health Care Education/Training Program

## 2020-09-06 ENCOUNTER — Other Ambulatory Visit: Payer: Self-pay | Admitting: Student in an Organized Health Care Education/Training Program

## 2020-09-06 DIAGNOSIS — M5416 Radiculopathy, lumbar region: Secondary | ICD-10-CM

## 2020-09-06 DIAGNOSIS — M48062 Spinal stenosis, lumbar region with neurogenic claudication: Secondary | ICD-10-CM

## 2020-09-06 DIAGNOSIS — G8929 Other chronic pain: Secondary | ICD-10-CM

## 2020-09-10 ENCOUNTER — Other Ambulatory Visit: Payer: Self-pay

## 2020-09-10 ENCOUNTER — Encounter: Payer: Self-pay | Admitting: Student in an Organized Health Care Education/Training Program

## 2020-09-10 ENCOUNTER — Ambulatory Visit (HOSPITAL_BASED_OUTPATIENT_CLINIC_OR_DEPARTMENT_OTHER): Payer: Medicare HMO | Admitting: Student in an Organized Health Care Education/Training Program

## 2020-09-10 ENCOUNTER — Ambulatory Visit
Admission: RE | Admit: 2020-09-10 | Discharge: 2020-09-10 | Disposition: A | Discharge status: Home / Self Care | Payer: Medicare HMO | Source: Ambulatory Visit | Attending: Student in an Organized Health Care Education/Training Program | Admitting: Student in an Organized Health Care Education/Training Program

## 2020-09-10 DIAGNOSIS — G8929 Other chronic pain: Secondary | ICD-10-CM | POA: Diagnosis present

## 2020-09-10 DIAGNOSIS — M5416 Radiculopathy, lumbar region: Secondary | ICD-10-CM

## 2020-09-10 DIAGNOSIS — M48062 Spinal stenosis, lumbar region with neurogenic claudication: Secondary | ICD-10-CM

## 2020-09-10 MED ORDER — DEXAMETHASONE SODIUM PHOSPHATE 10 MG/ML IJ SOLN
INTRAMUSCULAR | Status: AC
  Filled 2020-09-10: qty 1

## 2020-09-10 MED ORDER — IOHEXOL 180 MG/ML  SOLN
10.0000 mL | Freq: Once | INTRAMUSCULAR | Status: AC
  Administered 2020-09-10: 10 mL via EPIDURAL

## 2020-09-10 MED ORDER — SODIUM CHLORIDE 0.9% FLUSH
2.0000 mL | Freq: Once | INTRAVENOUS | Status: AC
  Administered 2020-09-10: 2 mL

## 2020-09-10 MED ORDER — ROPIVACAINE HCL 2 MG/ML IJ SOLN
INTRAMUSCULAR | Status: AC
  Filled 2020-09-10: qty 10

## 2020-09-10 MED ORDER — DEXAMETHASONE SODIUM PHOSPHATE 10 MG/ML IJ SOLN
10.0000 mg | Freq: Once | INTRAMUSCULAR | Status: AC
  Administered 2020-09-10: 10 mg

## 2020-09-10 MED ORDER — LIDOCAINE HCL 2 % IJ SOLN
INTRAMUSCULAR | Status: AC
  Filled 2020-09-10: qty 20

## 2020-09-10 MED ORDER — LIDOCAINE HCL 2 % IJ SOLN
20.0000 mL | Freq: Once | INTRAMUSCULAR | Status: AC
  Administered 2020-09-10: 400 mg

## 2020-09-10 MED ORDER — ROPIVACAINE HCL 2 MG/ML IJ SOLN
2.0000 mL | Freq: Once | INTRAMUSCULAR | Status: AC
  Administered 2020-09-10: 2 mL via EPIDURAL

## 2020-09-10 MED ORDER — SODIUM CHLORIDE (PF) 0.9 % IJ SOLN
INTRAMUSCULAR | Status: AC
  Filled 2020-09-10: qty 10

## 2020-09-10 NOTE — Progress Notes (Signed)
Safety precautions to be maintained throughout the outpatient stay will include: orient to surroundings, keep bed in low position, maintain call bell within reach at all times, provide assistance with transfer out of bed and ambulation.  

## 2020-09-10 NOTE — Patient Instructions (Signed)
Pain Management Discharge Instructions  General Discharge Instructions :  If you need to reach your doctor call: Monday-Friday 8:00 am - 4:00 pm at [phone] or toll free [phone].  After clinic hours [phone] to have operator reach doctor.  Bring all of your medication bottles to all your appointments in the pain clinic.  To cancel or reschedule your appointment with Pain Management please remember to call 24 hours in advance to avoid a fee.  Refer to the educational materials which you have been given on: General Risks, I had my Procedure. Discharge Instructions, Post Sedation.  Post Procedure Instructions:  The drugs you were given will stay in your system until tomorrow, so for the next 24 hours you should not drive, make any legal decisions or drink any alcoholic beverages.  You may eat anything you prefer, but it is better to start with liquids then soups and crackers, and gradually work up to solid foods.  Please notify your doctor immediately if you have any unusual bleeding, trouble breathing or pain that is not related to your normal pain.  Depending on the type of procedure that was done, some parts of your body may feel week and/or numb.  This usually clears up by tonight or the next day.  Walk with the use of an assistive device or accompanied by an adult for the 24 hours.  You may use ice on the affected area for the first 24 hours.  Put ice in a Ziploc bag and cover with a towel and place against area 15 minutes on 15 minutes off.  You may switch to heat after 24 hours.Epidural Steroid Injection Patient Information  Description: The epidural space surrounds the nerves as they exit the spinal cord.  In some patients, the nerves can be compressed and inflamed by a bulging disc or a tight spinal canal (spinal stenosis).  By injecting steroids into the epidural space, we can bring irritated nerves into direct contact with a potentially helpful medication.  These  steroids act directly on the irritated nerves and can reduce swelling and inflammation which often leads to decreased pain.  Epidural steroids may be injected anywhere along the spine and from the neck to the low back depending upon the location of your pain.   After numbing the skin with local anesthetic (like Novocaine), a small needle is passed into the epidural space slowly.  You may experience a sensation of pressure while this is being done.  The entire block usually last less than 10 minutes.  Conditions which may be treated by epidural steroids:   Low back and leg pain  Neck and arm pain  Spinal stenosis  Post-laminectomy syndrome  Herpes zoster (shingles) pain  Pain from compression fractures  Preparation for the injection:  1. Do not eat any solid food or dairy products within 8 hours of your appointment.  2. You may drink clear liquids up to 3 hours before appointment.  Clear liquids include water, black coffee, juice or soda.  No milk or cream please. 3. You may take your regular medication, including pain medications, with a sip of water before your appointment  Diabetics should hold regular insulin (if taken separately) and take 1/2 normal NPH dos the morning of the procedure.  Carry some sugar containing items with you to your appointment. 4. A driver must accompany you and be prepared to drive you home after your procedure.  5. Bring all your current medications with your. 6. An IV may be inserted and   sedation may be given at the discretion of the physician.   7. A blood pressure cuff, EKG and other monitors will often be applied during the procedure.  Some patients may need to have extra oxygen administered for a short period. 8. You will be asked to provide medical information, including your allergies, prior to the procedure.  We must know immediately if you are taking blood thinners (like Coumadin/Warfarin)  Or if you are allergic to IV iodine contrast (dye). We must  know if you could possible be pregnant.  Possible side-effects:  Bleeding from needle site  Infection (rare, may require surgery)  Nerve injury (rare)  Numbness & tingling (temporary)  Difficulty urinating (rare, temporary)  Spinal headache ( a headache worse with upright posture)  Light -headedness (temporary)  Pain at injection site (several days)  Decreased blood pressure (temporary)  Weakness in arm/leg (temporary)  Pressure sensation in back/neck (temporary)  Call if you experience:  Fever/chills associated with headache or increased back/neck pain.  Headache worsened by an upright position.  New onset weakness or numbness of an extremity below the injection site  Hives or difficulty breathing (go to the emergency room)  Inflammation or drainage at the infection site  Severe back/neck pain  Any new symptoms which are concerning to you  Please note:  Although the local anesthetic injected can often make your back or neck feel good for several hours after the injection, the pain will likely return.  It takes 3-7 days for steroids to work in the epidural space.  You may not notice any pain relief for at least that one week.  If effective, we will often do a series of three injections spaced 3-6 weeks apart to maximally decrease your pain.  After the initial series, we generally will wait several months before considering a repeat injection of the same type.  If you have any questions, please call [phone] [facility] 

## 2020-09-10 NOTE — Progress Notes (Signed)
PROVIDER NOTE: Information contained herein reflects review and annotations entered in association with encounter. Interpretation of such information and data should be left to medically-trained personnel. Information provided to patient can be located elsewhere in the medical record under "Patient Instructions". Document created using STT-dictation technology, any transcriptional errors that may result from process are unintentional.    Patient: Harold Sandoval  Service Category: Procedure  Provider: Edward Jolly, MD  DOB: 12-Jan-1941  DOS: 09/10/2020  Location: [facility]  MRN: [id number]  Setting: Ambulatory - outpatient  Referring Provider: Edward Jolly, MD  Type: Established Patient  Specialty: Interventional Pain Management  PCP: Gracelyn Nurse, MD   Primary Reason for Visit: Interventional Pain Management Treatment. CC: Back Pain (lumbar )  Procedure:          Anesthesia, Analgesia, Anxiolysis:  Type: Diagnostic Inter-Laminar Epidural Steroid Injection  #1  Region: Lumbar Level: L2-3 Level. Laterality: Midline         Type: Local Anesthesia  Local Anesthetic: Lidocaine 1-2%  Position: Prone with head of the table was raised to facilitate breathing.   Indications: 1. Lumbar radiculopathy   2. Chronic radicular lumbar pain   3. Spinal stenosis, lumbar region, with neurogenic claudication    Pain Score: Pre-procedure: 6 /10 Post-procedure: 5 /10   Last dose of Eliquis on Friday   Imaging: MRI L spine 08/24/2020  IMPRESSION:  1. Prior decompression and fusion from L3 to the sacrum with no  adverse features.  2. 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  3. Mild to moderate spinal stenosis at L1-L2 with moderate foraminal  stenosis.  4. Lower thoracic facet hypertrophy with moderate left T11 foraminal  stenosis.    Pre-op Assessment:  Harold Sandoval is a 79 y.o. (year old), male  patient, seen today for interventional treatment. 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 Harold Sandoval has a current medication list which includes the following prescription(s): apixaban, atorvastatin, brimonidine, brinzolamide, vitamin d3, claritin, desonide, fluticasone, gabapentin, latanoprost, metformin, tramadol, and linaclotide. His primarily concern today is the Back Pain (lumbar )  Initial Vital Signs:  Pulse/HCG Rate: 79ECG Heart Rate: 92 Temp: 98.2 F (36.8 C) Resp: 16 BP: 128/82 SpO2: 100 %  BMI: Estimated body mass index is 24.93 kg/m as calculated from the following:   Height as of this encounter: 6\' 4"  (1.93 m).   Weight as of this encounter: 204 lb 12.8 oz (92.9 kg).  Risk Assessment: Allergies: Reviewed. 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  Allergy Precautions: None required Coagulopathies: Reviewed. None identified.  Blood-thinner therapy: None at this time Active Infection(s): Reviewed. None identified. Harold Sandoval is afebrile  Site Confirmation: Harold Sandoval was asked to confirm the procedure and laterality before marking the site Procedure checklist: Completed Consent: Before the procedure and under the influence of no sedative(s), amnesic(s), or anxiolytics, the patient was informed of the treatment options, risks and possible complications. To fulfill our ethical and legal obligations, as recommended by the American Medical Association's Code of Ethics, I have informed the patient of my clinical impression; the nature and purpose of the treatment or procedure; the risks, benefits, and possible complications of the  intervention; the alternatives, including doing  nothing; the risk(s) and benefit(s) of the alternative treatment(s) or procedure(s); and the risk(s) and benefit(s) of doing nothing. The patient was provided information about the general risks and possible complications associated with the procedure. These may include, but are not limited to: failure to achieve desired goals, infection, bleeding, organ or nerve damage, allergic reactions, paralysis, and death. In addition, the patient was informed of those risks and complications associated to Spine-related procedures, such as failure to decrease pain; infection (i.e.: Meningitis, epidural or intraspinal abscess); bleeding (i.e.: epidural hematoma, subarachnoid hemorrhage, or any other type of intraspinal or peri-dural bleeding); organ or nerve damage (i.e.: Any type of peripheral nerve, nerve root, or spinal cord injury) with subsequent damage to sensory, motor, and/or autonomic systems, resulting in permanent pain, numbness, and/or weakness of one or several areas of the body; allergic reactions; (i.e.: anaphylactic reaction); and/or death. Furthermore, the patient was informed of those risks and complications associated with the medications. These include, but are not limited to: allergic reactions (i.e.: anaphylactic or anaphylactoid reaction(s)); adrenal axis suppression; blood sugar elevation that in diabetics may result in ketoacidosis or comma; water retention that in patients with history of congestive heart failure may result in shortness of breath, pulmonary edema, and decompensation with resultant heart failure; weight gain; swelling or edema; medication-induced neural toxicity; particulate matter embolism and blood vessel occlusion with resultant organ, and/or nervous system infarction; and/or aseptic necrosis of one or more joints. Finally, the patient was informed that Medicine is not an exact science; therefore, there is also the possibility of  unforeseen or unpredictable risks and/or possible complications that may result in a catastrophic outcome. The patient indicated having understood very clearly. We have given the patient no guarantees and we have made no promises. Enough time was given to the patient to ask questions, all of which were answered to the patient's satisfaction. Mr. Valverde has indicated that he wanted to continue with the procedure. Attestation: I, the ordering provider, attest that I have discussed with the patient the benefits, risks, side-effects, alternatives, likelihood of achieving goals, and potential problems during recovery for the procedure that I have provided informed consent. Date  Time: 09/10/2020  8:30 AM  Pre-Procedure Preparation:  Monitoring: As per clinic protocol. Respiration, ETCO2, SpO2, BP, heart rate and rhythm monitor placed and checked for adequate function Safety Precautions: Patient was assessed for positional comfort and pressure points before starting the procedure. Time-out: I initiated and conducted the "Time-out" before starting the procedure, as per protocol. The patient was asked to participate by confirming the accuracy of the "Time Out" information. Verification of the correct person, site, and procedure were performed and confirmed by me, the nursing staff, and the patient. "Time-out" conducted as per Joint Commission's Universal Protocol (UP.01.01.01). Time: 0916  Description of Procedure:          Target Area: The interlaminar space, initially targeting the lower laminar border of the superior vertebral body. Approach: Paramedial approach. Area Prepped: Entire Posterior Lumbar Region DuraPrep (Iodine Povacrylex [0.7% available iodine] and Isopropyl Alcohol, 74% w/w) Safety Precautions: Aspiration looking for blood return was conducted prior to all injections. At no point did we inject any substances, as a needle was being advanced. No attempts were made at seeking any paresthesias.  Safe injection practices and needle disposal techniques used. Medications properly checked for expiration dates. SDV (single dose vial) medications used. Description of the Procedure: Protocol guidelines were followed. The procedure needle was introduced through the skin, ipsilateral to the reported pain,  and advanced to the target area. Bone was contacted and the needle walked caudad, until the lamina was cleared. The epidural space was identified using "loss-of-resistance technique" with 2-3 ml of PF-NaCl (0.9% NSS), in a 5cc LOR glass syringe.  Vitals:   09/10/20 0912 09/10/20 0919 09/10/20 0924 09/10/20 0931  BP: 128/81 136/86 (!) 150/83 124/77  Pulse:      Resp: 18 16 18 18   Temp:      TempSrc:      SpO2: 100% 99% 100% 100%  Weight:      Height:        Start Time: 0916 hrs. End Time: 0927 hrs.  Materials:  Needle(s) Type: Epidural needle Gauge: 22G Length: 3.5-in Medication(s): Please see orders for medications and dosing details.  6 cc solution made of 3 cc of preservative-free saline, 2 cc of 0.2% ropivacaine, 1 cc of Decadron 10 mg/cc.  Imaging Guidance (Spinal):          Type of Imaging Technique: Fluoroscopy Guidance (Spinal) Indication(s): Assistance in needle guidance and placement for procedures requiring needle placement in or near specific anatomical locations not easily accessible without such assistance. Exposure Time: Please see nurses notes. Contrast: Before injecting any contrast, we confirmed that the patient did not have an allergy to iodine, shellfish, or radiological contrast. Once satisfactory needle placement was completed at the desired level, radiological contrast was injected. Contrast injected under live fluoroscopy. No contrast complications. See chart for type and volume of contrast used. Fluoroscopic Guidance: I was personally present during the use of fluoroscopy. "Tunnel Vision Technique" used to obtain the best possible view of the target area.  Parallax error corrected before commencing the procedure. "Direction-depth-direction" technique used to introduce the needle under continuous pulsed fluoroscopy. Once target was reached, antero-posterior, oblique, and lateral fluoroscopic projection used confirm needle placement in all planes. Images permanently stored in EMR. Interpretation: I personally interpreted the imaging intraoperatively. Adequate needle placement confirmed in multiple planes. Appropriate spread of contrast into desired area was observed. No evidence of afferent or efferent intravascular uptake. No intrathecal or subarachnoid spread observed. Permanent images saved into the patient's record.  Antibiotic Prophylaxis:   Anti-infectives (From admission, onward)   None     Indication(s): None identified  Post-operative Assessment:  Post-procedure Vital Signs:  Pulse/HCG Rate: 7989 Temp: 98.2 F (36.8 C) Resp: 18 BP: 124/77 SpO2: 100 %  EBL: None  Complications: No immediate post-treatment complications observed by team, or reported by patient.  Note: The patient tolerated the entire procedure well. A repeat set of vitals were taken after the procedure and the patient was kept under observation following institutional policy, for this type of procedure. Post-procedural neurological assessment was performed, showing return to baseline, prior to discharge. The patient was provided with post-procedure discharge instructions, including a section on how to identify potential problems. Should any problems arise concerning this procedure, the patient was given instructions to immediately contact , at any time, without hesitation. In any case, we plan to contact the patient by telephone for a follow-up status report regarding this interventional procedure.  Comments:  No additional relevant information.  Plan of Care   Restart Eliquis tomorrow so long as patient is not having any lower extremity weakness.  5 out of 5  strength bilateral lower extremity: Plantar flexion, dorsiflexion, knee flexion, knee extension.   Orders:  Orders Placed This Encounter  Procedures  . DG PAIN CLINIC C-ARM 1-60 MIN NO REPORT    Intraoperative interpretation [facility] procedural physician  at [facility].    Standing Status:   Standing    Number of Occurrences:   1    Order Specific Question:   Reason for exam:    Answer:   Assistance in needle guidance and placement for procedures requiring needle placement in or near specific anatomical locations not easily accessible without such assistance.    Medications ordered for procedure: Meds ordered this encounter  Medications  . iohexol (OMNIPAQUE) 180 MG/ML injection 10 mL    Must be Myelogram-compatible. If not available, you may substitute with a water-soluble, non-ionic, hypoallergenic, myelogram-compatible radiological contrast medium.  Marland Kitchen. lidocaine (XYLOCAINE) 2 % (with pres) injection 400 mg  . ropivacaine (PF) 2 mg/mL (0.2%) (NAROPIN) injection 2 mL  . sodium chloride flush (NS) 0.9 % injection 2 mL  . dexamethasone (DECADRON) injection 10 mg   Medications administered: We administered iohexol, lidocaine, ropivacaine (PF) 2 mg/mL (0.2%), sodium chloride flush, and dexamethasone.  See the medical record for exact dosing, route, and time of administration.  Follow-up plan:   Return in about 6 weeks (around 10/22/2020) for Post Procedure Evaluation, virtual.    Recent Visits Date Type Provider Dept  07/03/20 Office Visit Edward JollyLateef, Veroncia Jezek, MD [facility]  Showing recent visits within past 90 days and meeting all other requirements Today's Visits Date Type Provider Dept  09/10/20 Procedure visit Edward JollyLateef, Flonnie Wierman, MD [facility]  Showing today's visits and meeting all other requirements Future Appointments Date Type Provider Dept  10/22/20 Appointment Edward JollyLateef, Teliah Buffalo, MD [facility]  Showing future appointments within next 90 days and  meeting all other requirements  Disposition: Discharge home  Discharge (Date  Time): 09/10/2020; 0940 hrs.   Primary Care Physician: Gracelyn NurseJohnston, [name], MD [facility] Note by: Edward JollyBilal Judythe Postema, MD Date: 09/10/2020; Time: 9:50 AM  Disclaimer:  Medicine is not an exact science. The only guarantee in medicine is that nothing is guaranteed. It is important to note that the decision to proceed with this intervention was based on the information collected from the patient. The Data and conclusions were drawn from the patient's questionnaire, the interview, and the physical examination. Because the information was provided in large part by the patient, it cannot be guaranteed that it has not been purposely or unconsciously manipulated. Every effort has been made to obtain as much relevant data as possible for this evaluation. It is important to note that the conclusions that lead to this procedure are derived in large part from the available data. Always take into account that the treatment will also be dependent on availability of resources and existing treatment guidelines, considered by other Pain Management Practitioners as being common knowledge and practice, at the time of the intervention. For Medico-Legal purposes, it is also important to point out that variation in procedural techniques and pharmacological choices are the acceptable norm. The indications, contraindications, technique, and results of the above procedure should only be interpreted and judged by a Board-Certified Interventional Pain Specialist with extensive familiarity and expertise in the same exact procedure and technique.

## 2020-09-11 ENCOUNTER — Telehealth: Payer: Self-pay

## 2020-09-11 NOTE — Telephone Encounter (Signed)
Post procedure phone call.   No answer.  

## 2020-10-22 ENCOUNTER — Telehealth: Payer: Medicare HMO | Admitting: Student in an Organized Health Care Education/Training Program

## 2020-10-24 ENCOUNTER — Encounter: Payer: Self-pay | Admitting: Student in an Organized Health Care Education/Training Program

## 2020-10-25 ENCOUNTER — Ambulatory Visit
Payer: Medicare HMO | Attending: Student in an Organized Health Care Education/Training Program | Admitting: Student in an Organized Health Care Education/Training Program

## 2020-10-25 ENCOUNTER — Encounter: Payer: Self-pay | Admitting: Student in an Organized Health Care Education/Training Program

## 2020-10-25 ENCOUNTER — Other Ambulatory Visit: Payer: Self-pay

## 2020-10-25 DIAGNOSIS — M5416 Radiculopathy, lumbar region: Secondary | ICD-10-CM

## 2020-10-25 DIAGNOSIS — M48062 Spinal stenosis, lumbar region with neurogenic claudication: Secondary | ICD-10-CM | POA: Diagnosis not present

## 2020-10-25 DIAGNOSIS — G8929 Other chronic pain: Secondary | ICD-10-CM | POA: Diagnosis not present

## 2020-10-25 NOTE — Progress Notes (Signed)
Patient: Harold Sandoval  Service Category: E/Harold  Provider: Gillis Santa, MD  DOB: [birth year]  DOS: 10/25/2020  Location: Office  MRN: [id number]  Setting: Ambulatory outpatient  Referring Provider: Baxter Hire, MD  Type: Established Patient  Specialty: Interventional Pain Management  PCP: Baxter Hire, MD  Location: Home  Delivery: TeleHealth     Virtual Encounter - Pain Management PROVIDER NOTE: Information contained herein reflects review and annotations entered in association with encounter. Interpretation of such information and data should be left to medically-trained personnel. Information provided to patient can be located elsewhere in the medical record under "Patient Instructions". Document created using STT-dictation technology, any transcriptional errors that may result from process are unintentional.    Contact & Pharmacy Preferred: [phone] Home: [phone] (home) Mobile: [phone] (mobile) E-mail: [email or web address]  [id number] [place] [address] [id number]: [phone]: [phone]  Pre-screening  Mr. BEconomouoffered "in-person" vs "virtual" encounter. He indicated preferring virtual for this encounter.   Reason COVID-19*   Social distancing based on CDC and AMA recommendations.   I contacted Harold Ratelon 10/25/2020 via video conference.      I clearly identified myself as BGillis Santa MD. I verified that I was speaking with the correct person using two identifiers (Name: Harold Sandoval and date of birth: 110-04-1941.  Consent I sought verbal advanced consent from Harold Ratelfor virtual visit interactions. I informed Mr. BRionof possible security and privacy concerns, risks, and limitations associated with providing "not-in-person" medical evaluation and management services. I also informed [name] the availability of "in-person" appointments. Finally, I informed him that  there would be a charge for the virtual visit and that he could be  personally, fully or partially, financially responsible for it. Mr. BFullingtonexpressed understanding and agreed to proceed.   Historic Elements   Mr. WDIMITRIOS BALESTRIERIis a 79y.o. year old, male patient evaluated today after our last contact on 09/10/2020. 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 Mr. BHenkhas a current medication list which includes the following prescription(s): apixaban, atorvastatin, brimonidine, brinzolamide, vitamin d3, claritin, desonide, fluticasone, gabapentin, latanoprost, metformin, linaclotide, and tramadol. He  reports that he has never smoked. He has never used smokeless tobacco. He reports that he does not drink alcohol and does not use drugs. Mr. BFranzenis allergic to lactose intolerance (gi).   HPI  Today, he is being contacted for a post-procedure assessment.   Post-Procedure Evaluation  Procedure (09/10/2020):   Type: Diagnostic Inter-Laminar Epidural Steroid Injection  #1  Region: Lumbar Level: L2-3 Level. Laterality: Midline   Sedation: Please see nurses note.  Effectiveness during initial hour after  procedure(Ultra-Short Term Relief): 100 % (results reported from wife)   Local anesthetic used: Long-acting (4-6 hours) Effectiveness: Defined as any analgesic benefit obtained secondary to the administration of  local anesthetics. This carries significant diagnostic value as to the etiological location, or anatomical origin, of the pain. Duration of benefit is expected to coincide with the duration of the local anesthetic used.  Effectiveness during initial 4-6 hours after procedure(Short-Term Relief): 100 % (results reported by wife)   Long-term benefit: Defined as any relief past the pharmacologic duration of the local anesthetics.  Effectiveness past the initial 6 hours after procedure(Long-Term Relief): 80 % (results reported by wife)   Current benefits: Defined as benefit that persist at this time.   Analgesia:  >75% relief Function: Mr. Belt reports improvement in function ROM: [name] reports improvement in ROM   Laboratory Chemistry Profile   Renal Lab Results  Component Value Date   BUN 11 02/26/2019   CREATININE 0.79 02/26/2019   GFRAA >60 02/26/2019   GFRNONAA >60 02/26/2019     Hepatic Lab Results  Component Value Date   AST 20 02/26/2019   ALT 18 02/26/2019   ALBUMIN 4.1 02/26/2019   ALKPHOS 79 02/26/2019   AMMONIA 9 09/06/2018     Electrolytes Lab Results  Component Value Date   NA 139 02/26/2019   K 3.5 02/26/2019   CL 102 02/26/2019   CALCIUM 9.2 02/26/2019     Bone No results found for: VD25OH, VD125OH2TOT, PI9518AC1, YS0630ZS0, 25OHVITD1, 25OHVITD2, 25OHVITD3, TESTOFREE, TESTOSTERONE   Inflammation (CRP: Acute Phase) (ESR: Chronic Phase) No results found for: CRP, ESRSEDRATE, LATICACIDVEN     Note: Above Lab results reviewed.  Assessment  The primary encounter diagnosis was Lumbar radiculopathy. Diagnoses of Chronic radicular lumbar pain and Spinal stenosis, lumbar region, with neurogenic claudication were also pertinent to this  visit.  Plan of Care   Mr. Rinaldo Sandoval has a current medication list which includes the following long-term medication(s): apixaban, atorvastatin, claritin, metformin, and linaclotide.  Significant pain relief and improvement in functional status after midline L2-L3 epidural steroid injection performed on September 30, 2020.  Patient states that he is able to do his ADLs with less pain and feels that the lumbar epidural steroid injection has helped with his strength and range of motion as well.  We will continue to monitor his symptoms.  As needed order placed for repeat lumbar epidural steroid injection.  Follow-up as needed.  Orders:  Orders Placed This Encounter  Procedures   Lumbar Epidural Injection    Standing Status:   Standing    Number of Occurrences:   9    Standing Expiration Date:   10/25/2021    Scheduling Instructions:     Purpose: Palliative     Indication: Lower extremity pain/Sciatica unspecified side (M54.30).     Side: Midline     Level: TBD     Sedation: Patient's choice.     TIMEFRAME: PRN procedure. (Mr. Demicco will call when needed.)    Order Specific Question:   Where will this procedure be performed?    Answer:   [facility]   Follow-up plan:   Return for PRN.   Recent Visits Date Type Provider Dept  09/10/20 Procedure visit Gillis Santa, MD [facility]  Showing recent visits within past 90 days and meeting all other requirements Today's Visits Date Type Provider Dept  10/25/20 Telemedicine Gillis Santa, MD [facility]  Showing today's visits and meeting all other requirements Future Appointments No visits were found meeting these conditions. Showing future appointments within next 90 days and meeting all other requirements  I discussed the assessment and treatment plan  with the patient. The patient was provided an opportunity to ask questions and all were answered. The patient agreed with the plan and demonstrated an  understanding of the instructions.  Patient advised to call back or seek an in-person evaluation if the symptoms or condition worsens.  Duration of encounter: 20 minutes.  Note by: Gillis Santa, MD Date: 10/25/2020; Time: 1:21 PM

## 2020-12-13 ENCOUNTER — Ambulatory Visit: Payer: Medicare HMO | Admitting: Student in an Organized Health Care Education/Training Program

## 2020-12-27 ENCOUNTER — Encounter: Payer: Self-pay | Admitting: Student in an Organized Health Care Education/Training Program

## 2020-12-27 ENCOUNTER — Ambulatory Visit
Payer: Medicare HMO | Attending: Student in an Organized Health Care Education/Training Program | Admitting: Student in an Organized Health Care Education/Training Program

## 2020-12-27 ENCOUNTER — Other Ambulatory Visit: Payer: Self-pay

## 2020-12-27 VITALS — BP 123/78 | HR 89 | Temp 97.2°F | Ht 76.0 in | Wt 208.0 lb

## 2020-12-27 DIAGNOSIS — M5414 Radiculopathy, thoracic region: Secondary | ICD-10-CM | POA: Diagnosis not present

## 2020-12-27 DIAGNOSIS — M47894 Other spondylosis, thoracic region: Secondary | ICD-10-CM | POA: Insufficient documentation

## 2020-12-27 DIAGNOSIS — M48062 Spinal stenosis, lumbar region with neurogenic claudication: Secondary | ICD-10-CM | POA: Diagnosis present

## 2020-12-27 DIAGNOSIS — I639 Cerebral infarction, unspecified: Secondary | ICD-10-CM | POA: Insufficient documentation

## 2020-12-27 DIAGNOSIS — G588 Other specified mononeuropathies: Secondary | ICD-10-CM | POA: Diagnosis present

## 2020-12-27 DIAGNOSIS — G894 Chronic pain syndrome: Secondary | ICD-10-CM | POA: Insufficient documentation

## 2020-12-27 NOTE — Progress Notes (Signed)
Safety precautions to be maintained throughout the outpatient stay will include: orient to surroundings, keep bed in low position, maintain call bell within reach at all times, provide assistance with transfer out of bed and ambulation.  

## 2020-12-27 NOTE — Patient Instructions (Addendum)
What is an intercostal nerve block? An intercostal nerve block is an injection of medication that helps relieve pain in the chest area caused by a herpes zoster infection (or "shingles") or a surgical incision.  Intercostal nerves are located under each rib. When one of these nerves or the tissue around it gets irritated or inflamed, it can cause pain. A steroid medication and local anesthetic injected under the rib can help reduce the inflammation and alleviate the pain.  Intercostal nerve blocks also can be used to help diagnose the source of pain.  How is an intercostal nerve block done? First, you'll be given an intravenous medication to relax you. Then, you'll lie on your side -- the one not causing pain.  The doctor will use antiseptic to clean an area of skin near your ribs. Then he or she will:  Insert a thin needle under your rib and inject anesthetic Use x-ray guidance to insert a second needle and inject a steroid pain medication Usually, the procedure takes less than 30 minutes, and you can go home the same day.  How effective is an intercostal nerve block? Some patients report pain relief immediately after the injection, but the pain may return a few hours later as the anesthetic wears off. Longer term relief usually begins in two to three days, once the steroid begins to work.  How long the pain relief lasts is different for each patient. For some, the relief lasts several months. If the treatment works for you, you can have periodic injections to stay pain-free.  What are the risks? The risk of complication from an intercostal nerve block is very low. However, there could be bruising or soreness at the injection site. Serious complications, including infection, collapsed lung, nerve damage and bleeding, are uncommon.  What happens after the procedure? Do not drive or do any rigorous activity for 24 hours after your intercostal nerve block. Take it easy. You can return to your  normal activities the next day.  You can continue your regular diet and medications immediately.  Is an intercostal nerve block right for you? An intercostal nerve block may be right for you if you have a recent onset of pain in the chest area -- especially due to shingles or a surgical incision -- which does not respond to other treatment.
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
## 2021-01-09 ENCOUNTER — Other Ambulatory Visit: Payer: Self-pay

## 2021-01-09 ENCOUNTER — Ambulatory Visit
Admission: RE | Admit: 2021-01-09 | Discharge: 2021-01-09 | Disposition: A | Discharge status: Home / Self Care | Payer: Medicare HMO | Source: Ambulatory Visit | Attending: Student in an Organized Health Care Education/Training Program | Admitting: Student in an Organized Health Care Education/Training Program

## 2021-01-09 ENCOUNTER — Ambulatory Visit (HOSPITAL_BASED_OUTPATIENT_CLINIC_OR_DEPARTMENT_OTHER): Payer: Medicare HMO | Admitting: Student in an Organized Health Care Education/Training Program

## 2021-01-09 DIAGNOSIS — G588 Other specified mononeuropathies: Secondary | ICD-10-CM | POA: Insufficient documentation

## 2021-01-09 DIAGNOSIS — G894 Chronic pain syndrome: Secondary | ICD-10-CM

## 2021-01-09 MED ORDER — IOHEXOL 180 MG/ML  SOLN
10.0000 mL | Freq: Once | INTRAMUSCULAR | Status: AC
  Administered 2021-01-09: 10 mL via INTRA_ARTICULAR

## 2021-01-09 MED ORDER — DEXAMETHASONE SODIUM PHOSPHATE 10 MG/ML IJ SOLN
10.0000 mg | Freq: Once | INTRAMUSCULAR | Status: AC
  Administered 2021-01-09: 10 mg

## 2021-01-09 MED ORDER — ROPIVACAINE HCL 2 MG/ML IJ SOLN
9.0000 mL | Freq: Once | INTRAMUSCULAR | Status: AC
  Administered 2021-01-09: 9 mL via PERINEURAL
  Filled 2021-01-09: qty 10

## 2021-01-09 MED ORDER — ROPIVACAINE HCL 2 MG/ML IJ SOLN
INTRAMUSCULAR | Status: AC
  Filled 2021-01-09: qty 10

## 2021-01-09 MED ORDER — LIDOCAINE HCL 2 % IJ SOLN
20.0000 mL | Freq: Once | INTRAMUSCULAR | Status: AC
  Administered 2021-01-09: 200 mg
  Filled 2021-01-09: qty 20

## 2021-01-09 MED ORDER — LIDOCAINE HCL (PF) 2 % IJ SOLN
INTRAMUSCULAR | Status: AC
  Filled 2021-01-09: qty 10

## 2021-01-09 MED ORDER — DEXAMETHASONE SODIUM PHOSPHATE 10 MG/ML IJ SOLN
INTRAMUSCULAR | Status: AC
  Filled 2021-01-09: qty 2

## 2021-01-09 NOTE — Progress Notes (Signed)
PROVIDER NOTE: Information contained herein reflects review and annotations entered in association with encounter. Interpretation of such information and data should be left to medically-trained personnel. Information provided to patient can be located elsewhere in the medical record under "Patient Instructions". Document created using STT-dictation technology, any transcriptional errors that may result from process are unintentional.    Patient: Harold Sandoval  Service Category: Procedure  Provider: Edward Jolly, MD  DOB: [birth year]  DOS: 01/09/2021  Location: [facility]  MRN: [id number]  Setting: Ambulatory - outpatient  Referring Provider: Edward Jolly, MD  Type: Established Patient  Specialty: Interventional Pain Management  PCP: Gracelyn Nurse, MD   Primary Reason for Visit: Interventional Pain Management Treatment. CC: rib cage pain  Procedure:          Anesthesia, Analgesia, Anxiolysis:  Type: Diagnostic Posterior Intercostal  Nerve Block  #1  Region: Posterior  Thoracic Area Level: T8, T9, T10, T11 ribs Laterality: Right-Sided  Type: Local Anesthesia  Local Anesthetic: Lidocaine 1-2%  Position: Prone   Indications: 1. Intercostal neuralgia (right intercostal nerve)   2. Chronic pain syndrome    Pain Score: Pre-procedure: 3 /10 Post-procedure: 2 /10   Pre-op H&P Assessment:  Mr. Goynes is a 80 y.o. (year old), male patient, seen today for interventional treatment. 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 Mr. Morina has a current medication list which includes the following prescription(s): atorvastatin,  brimonidine, brinzolamide, vitamin d3, claritin, desonide, fluticasone, gabapentin, latanoprost, metformin, apixaban, and linaclotide. His primarily concern today is the rib cage pain  Initial Vital Signs:  Pulse/HCG Rate: 82ECG Heart Rate: 91 Temp: 97.9 F (36.6 C) Resp: 18 BP: 126/67 SpO2: 100 %  BMI: Estimated body mass index is 25.32 kg/m as calculated from the following:   Height as of this encounter: 6\' 4"  (1.93 m).   Weight as of this encounter: 208 lb (94.3 kg).  Risk Assessment: Allergies: Reviewed. 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  Allergy Precautions: None required Coagulopathies: Reviewed. None identified.  Blood-thinner therapy: None at this time Active Infection(s): Reviewed. None identified. Mr. Cisse is afebrile  Site Confirmation: Mr. Schweikert was asked to confirm the procedure and laterality before marking the site Procedure checklist: Completed Consent: Before the procedure and under the influence of no sedative(s), amnesic(s), or anxiolytics, the patient was informed of the treatment options, risks and possible complications. To fulfill our ethical and legal obligations, as recommended by the American Medical Association's Code of Ethics, I have informed the patient of my clinical impression; the nature and purpose of the treatment or procedure; the risks, benefits, and possible complications of the intervention; the alternatives, including doing nothing; the risk(s) and benefit(s) of the alternative treatment(s) or procedure(s); and the risk(s) and benefit(s) of doing nothing. The patient was provided information about the general risks and possible complications associated with the procedure. These may include, but are not limited to: failure to achieve desired goals, infection, bleeding, organ or nerve damage, allergic reactions, paralysis, and death. In addition, the patient was informed of those risks and complications associated to the procedure, such  as failure to decrease pain; infection; bleeding; organ or nerve damage with subsequent damage to sensory, motor, and/or autonomic systems, resulting in permanent pain, numbness, and/or  weakness of one or several areas of the body; allergic reactions; (i.e.: anaphylactic reaction); and/or death. Furthermore, the patient was informed of those risks and complications associated with the medications. These include, but are not limited to: allergic reactions (i.e.: anaphylactic or anaphylactoid reaction(s)); adrenal axis suppression; blood sugar elevation that in diabetics may result in ketoacidosis or comma; water retention that in patients with history of congestive heart failure may result in shortness of breath, pulmonary edema, and decompensation with resultant heart failure; weight gain; swelling or edema; medication-induced neural toxicity; particulate matter embolism and blood vessel occlusion with resultant organ, and/or nervous system infarction; and/or aseptic necrosis of one or more joints. Finally, the patient was informed that Medicine is not an exact science; therefore, there is also the possibility of unforeseen or unpredictable risks and/or possible complications that may result in a catastrophic outcome. The patient indicated having understood very clearly. We have given the patient no guarantees and we have made no promises. Enough time was given to the patient to ask questions, all of which were answered to the patient's satisfaction. Mr. Magowan has indicated that he wanted to continue with the procedure. Attestation: I, the ordering provider, attest that I have discussed with the patient the benefits, risks, side-effects, alternatives, likelihood of achieving goals, and potential problems during recovery for the procedure that I have provided informed consent. Date  Time: 01/09/2021 10:14 AM  Pre-Procedure Preparation:  Monitoring: As per clinic protocol. Respiration, ETCO2, SpO2, BP, heart  rate and rhythm monitor placed and checked for adequate function Safety Precautions: Patient was assessed for positional comfort and pressure points before starting the procedure. Time-out: I initiated and conducted the "Time-out" before starting the procedure, as per protocol. The patient was asked to participate by confirming the accuracy of the "Time Out" information. Verification of the correct person, site, and procedure were performed and confirmed by me, the nursing staff, and the patient. "Time-out" conducted as per Joint Commission's Universal Protocol (UP.01.01.01). Time: 1114  Description of Procedure:          Target Area: The sub-costal neurovascular bundle area Approach: Sub-costal approach Area Prepped: Entire Posterior  Thoracic Region DuraPrep (Iodine Povacrylex [0.7% available iodine] and Isopropyl Alcohol, 74% w/w) Safety Precautions: Aspiration looking for blood return was conducted prior to all injections. At no point did we inject any substances, as a needle was being advanced. No attempts were made at seeking any paresthesias. Safe injection practices and needle disposal techniques used. Medications properly checked for expiration dates. SDV (single dose vial) medications used. Description of the Procedure: Protocol guidelines were followed. The patient was placed in position over the procedure table. The target area was identified and the area prepped in the usual manner. Skin & deeper tissues infiltrated with local anesthetic. After cleaning the skin with an antiseptic solution, 1-2 mL of dilute local anesthetic was infiltrated subcutaneously at the planned injection site. The fingers of the palpating hand were used to straddle the insertion site at the inferior border of the rib and fix the skin to avoid unwanted skin movement. Appropriate amount of time allowed to pass for local anesthetics to take effect. The procedure needles were then advanced to the target area at an angle  of approximately 20 cephalad to the skin. Contact with the rib was made. While maintaining the same angle of insertion, the needle was walked off the inferior border of the rib as the skin was allowed to return to its initial position. Then the needle was advanced  no more than 3 mm below the inferior margin of the rib. Proper needle placement was secured. Negative aspiration confirmed. Following negative aspiration for blood or air, 3-5 mL of local anesthetic was injected. The solution injected in intermittent fashion, asking for systemic symptoms every 0.5cc of injectate. The needle was then removed and the area cleansed, making sure to leave some of the prepping solution back to take advantage of its long term bactericidal properties. Vitals:   01/09/21 1120 01/09/21 1124 01/09/21 1129 01/09/21 1134  BP: 135/84 138/87 (!) 143/80 139/87  Pulse:      Resp: 18 (!) 21 17 19   Temp:      SpO2: 100% 100% 100% 100%  Weight:      Height:        Start Time: 1114 hrs. End Time: 1133 hrs.   Materials:  Needle(s) Type: Spinal needle Gauge: 25G Length: 3.5-in Medication(s): Please see orders for medications and dosing details. 10 cc solution made of 8 cc of 0.2% ropivacaine, 2 cc of Decadron, 10 mg/cc.  2.5 cc injected at each level on the right for the T8, T9, T10, T11 intercostal nerve. Imaging Guidance (Non-Spinal):          Type of Imaging Technique: Fluoroscopy Guidance (Non-Spinal) Indication(s): Assistance in needle guidance and placement for procedures requiring needle placement in or near specific anatomical locations not easily accessible without such assistance. Exposure Time: Please see nurses notes. Contrast: Before injecting any contrast, we confirmed that the patient did not have an allergy to iodine, shellfish, or radiological contrast. Once satisfactory needle placement was completed at the desired level, radiological contrast was injected. Contrast injected under live fluoroscopy. No  contrast complications. See chart for type and volume of contrast used. Fluoroscopic Guidance: I was personally present during the use of fluoroscopy. "Tunnel Vision Technique" used to obtain the best possible view of the target area. Parallax error corrected before commencing the procedure. "Direction-depth-direction" technique used to introduce the needle under continuous pulsed fluoroscopy. Once target was reached, antero-posterior, oblique, and lateral fluoroscopic projection used confirm needle placement in all planes. Images permanently stored in EMR. Interpretation: I personally interpreted the imaging intraoperatively. Adequate needle placement confirmed in multiple planes. Appropriate spread of contrast into desired area was observed. No evidence of afferent or efferent intravascular uptake. Permanent images saved into the patient's record.   Post-operative Assessment:  Post-procedure Vital Signs:  Pulse/HCG Rate: 8280 Temp: 97.9 F (36.6 C) Resp: 19 BP: 139/87 SpO2: 100 %  EBL: None  Complications: No immediate post-treatment complications observed by team, or reported by patient.  Note: The patient tolerated the entire procedure well. A repeat set of vitals were taken after the procedure and the patient was kept under observation following institutional policy, for this type of procedure. Post-procedural neurological assessment was performed, showing return to baseline, prior to discharge. The patient was provided with post-procedure discharge instructions, including a section on how to identify potential problems. Should any problems arise concerning this procedure, the patient was given instructions to immediately contact , at any time, without hesitation. In any case, we plan to contact the patient by telephone for a follow-up status report regarding this interventional procedure.  Comments:  No additional relevant information.  Plan of Care  Orders:  Orders Placed This  Encounter  Procedures  . DG PAIN CLINIC C-ARM 1-60 MIN NO REPORT    Intraoperative interpretation by procedural physician at [facility].    Standing Status:   Standing  Number of Occurrences:   1    Order Specific Question:   Reason for exam:    Answer:   Assistance in needle guidance and placement for procedures requiring needle placement in or near specific anatomical locations not easily accessible without such assistance.    Medications ordered for procedure: Meds ordered this encounter  Medications  . iohexol (OMNIPAQUE) 180 MG/ML injection 10 mL    Must be Myelogram-compatible. If not available, you may substitute with a water-soluble, non-ionic, hypoallergenic, myelogram-compatible radiological contrast medium.  Marland Kitchen lidocaine (XYLOCAINE) 2 % (with pres) injection 400 mg  . ropivacaine (PF) 2 mg/mL (0.2%) (NAROPIN) injection 9 mL  . dexamethasone (DECADRON) injection 10 mg  . dexamethasone (DECADRON) injection 10 mg   Medications administered: We administered iohexol, lidocaine, ropivacaine (PF) 2 mg/mL (0.2%), dexamethasone, and dexamethasone.  See the medical record for exact dosing, route, and time of administration.  Follow-up plan:   No follow-ups on file.    Recent Visits Date Type Provider Dept  12/27/20 Office Visit Edward Jolly, MD [facility]  10/25/20 Telemedicine Edward Jolly, MD [facility]  Showing recent visits within past 90 days and meeting all other requirements Today's Visits Date Type Provider Dept  01/09/21 Procedure visit Edward Jolly, MD [facility]  Showing today's visits and meeting all other requirements Future Appointments Date Type Provider Dept  01/24/21 Appointment Edward Jolly, MD [facility]  Showing future appointments within next 90 days and meeting all other requirements  Disposition: Discharge home  Discharge (Date  Time): 01/09/2021; 1146 hrs.   Primary Care Physician:  Gracelyn Nurse, MD Location: [facility] Note by: Edward Jolly, MD Date: 01/09/2021; Time: 11:55 AM  Disclaimer:  Medicine is not an exact science. The only guarantee in medicine is that nothing is guaranteed. It is important to note that the decision to proceed with this intervention was based on the information collected from the patient. The Data and conclusions were drawn from the patient's questionnaire, the interview, and the physical examination. Because the information was provided in large part by the patient, it cannot be guaranteed that it has not been purposely or unconsciously manipulated. Every effort has been made to obtain as much relevant data as possible for this evaluation. It is important to note that the conclusions that lead to this procedure are derived in large part from the available data. Always take into account that the treatment will also be dependent on availability of resources and existing treatment guidelines, considered by other Pain Management Practitioners as being common knowledge and practice, at the time of the intervention. For Medico-Legal purposes, it is also important to point out that variation in procedural techniques and pharmacological choices are the acceptable norm. The indications, contraindications, technique, and results of the above procedure should only be interpreted and judged by a Board-Certified Interventional Pain Specialist with extensive familiarity and expertise in the same exact procedure and technique.

## 2021-01-09 NOTE — Progress Notes (Signed)
Safety precautions to be maintained throughout the outpatient stay will include: orient to surroundings, keep bed in low position, maintain call bell within reach at all times, provide assistance with transfer out of bed and ambulation.  

## 2021-01-09 NOTE — Patient Instructions (Signed)

## 2021-01-10 ENCOUNTER — Telehealth: Payer: Self-pay | Admitting: *Deleted

## 2021-01-10 NOTE — Telephone Encounter (Signed)
Attempted to call for post procedure follow-up. Message left at [facility] number.

## 2021-01-11 ENCOUNTER — Telehealth: Payer: Self-pay | Admitting: Student in an Organized Health Care Education/Training Program

## 2021-01-23 ENCOUNTER — Encounter: Payer: Self-pay | Admitting: Student in an Organized Health Care Education/Training Program

## 2021-01-24 ENCOUNTER — Ambulatory Visit
Payer: Medicare HMO | Attending: Student in an Organized Health Care Education/Training Program | Admitting: Student in an Organized Health Care Education/Training Program

## 2021-01-24 ENCOUNTER — Other Ambulatory Visit: Payer: Self-pay

## 2021-01-24 DIAGNOSIS — M48062 Spinal stenosis, lumbar region with neurogenic claudication: Secondary | ICD-10-CM

## 2021-01-24 DIAGNOSIS — G588 Other specified mononeuropathies: Secondary | ICD-10-CM

## 2021-01-24 DIAGNOSIS — G894 Chronic pain syndrome: Secondary | ICD-10-CM

## 2021-01-24 DIAGNOSIS — M5414 Radiculopathy, thoracic region: Secondary | ICD-10-CM

## 2021-01-24 NOTE — Progress Notes (Signed)
Patient: Harold Sandoval  Service Category: E/M  Provider: Gillis Santa, MD  DOB: September 05, 1941  DOS: 01/24/2021  Location: Office  MRN: [id number]  Setting: Ambulatory outpatient  Referring Provider: Baxter Hire, MD  Type: Established Patient  Specialty: Interventional Pain Management  PCP: Baxter Hire, MD  Location: Home  Delivery: TeleHealth     Virtual Encounter - Pain Management PROVIDER NOTE: Information contained herein reflects review and annotations entered in association with encounter. Interpretation of such information and data should be left to medically-trained personnel. Information provided to patient can be located elsewhere in the medical record under "Patient Instructions". Document created using STT-dictation technology, any transcriptional errors that may result from process are unintentional.    Contact & Pharmacy Preferred: [phone] Home: [phone] (home) Mobile: [phone] (mobile) E-mail: [email or web address]  [id number] [place] [place]Phone: [phone]: [phone]  Pre-screening  Mr. BRiesgooffered "in-person" vs "virtual" encounter. He indicated preferring virtual for this encounter.   Reason COVID-19*  Social distancing based on CDC and AMA recommendations.   I contacted WRinaldo Ratelon 01/24/2021 via video conference.      I clearly identified myself as BGillis Santa MD. I verified that I was speaking with the correct person using two identifiers (Name: WKILAN BANFILL and date of birth: 1December 18, 1942.  Consent I sought verbal advanced consent from WRinaldo Ratelfor virtual visit interactions. I informed Mr. BMcgeehanof possible security and privacy concerns, risks, and limitations associated with providing "not-in-person" medical evaluation and management services. I also informed Mr. BLyof the availability of "in-person" appointments. Finally, I informed him that  there would be a charge for the virtual visit and that he could be  personally, fully or partially, financially responsible for it. Mr. BWolfingerexpressed understanding and agreed to proceed.   Historic Elements   Mr. WSHMUEL GIRGISis a 80y.o. year old, male patient evaluated today after our last contact on 01/11/2021. Mr. 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 Mr. BVidriohas a current medication list which includes the following prescription(s): apixaban, atorvastatin, brimonidine, brinzolamide, vitamin d3, claritin, desonide, fluticasone, gabapentin, latanoprost, metformin, and linaclotide. He  reports that he has never smoked. He has never used smokeless tobacco. He reports that he does not drink alcohol and does not use drugs. Mr. BMcfadyenis allergic to lactose intolerance (gi).   HPI  Today, he is being contacted for a post-procedure assessment.  Virtual visit for postprocedural follow-up after right T8, T9, T10, T11 intercostal nerve block.  See results below.  Post-Procedure Evaluation  Procedure (01/11/2021):   Type: Diagnostic Posterior Intercostal  Nerve Block  #1  Region: Posterior  Thoracic Area Level: T8, T9,  T10, T11 ribs Laterality: Right-Sided  Sedation: Please see nurses note.  Effectiveness during initial hour after procedure(Ultra-Short Term Relief): 90 %  Local  anesthetic used: Long-acting (4-6 hours) Effectiveness: Defined as any analgesic benefit obtained secondary to the administration of local anesthetics. This carries significant diagnostic value as to the etiological location, or anatomical origin, of the pain. Duration of benefit is expected to coincide with the duration of the local anesthetic used.  Effectiveness during initial 4-6 hours after procedure(Short-Term Relief): 75%  Long-term benefit: Defined as any relief past the pharmacologic duration of the local anesthetics.  Effectiveness past the initial 6 hours after procedure(Long-Term Relief): 60 % .  Current benefits: Defined as benefit that persist at this time.   Analgesia:  >50% relief   Laboratory Chemistry Profile   Renal Lab Results  Component Value Date   BUN 11 02/26/2019   CREATININE 0.79 02/26/2019   GFRAA >60 02/26/2019   GFRNONAA >60 02/26/2019     Hepatic Lab Results  Component Value Date   AST 20 02/26/2019   ALT 18 02/26/2019   ALBUMIN 4.1 02/26/2019   ALKPHOS 79 02/26/2019   AMMONIA 9 09/06/2018     Electrolytes Lab Results  Component Value Date   NA 139 02/26/2019   K 3.5 02/26/2019   CL 102 02/26/2019   CALCIUM 9.2 02/26/2019     Bone No results found for: VD25OH, VD125OH2TOT, HK7425ZD6, LO7564PP2, 25OHVITD1, 25OHVITD2, 25OHVITD3, TESTOFREE, TESTOSTERONE   Inflammation (CRP: Acute Phase) (ESR: Chronic Phase) No results found for: CRP, ESRSEDRATE, LATICACIDVEN     Note: Above Lab results reviewed.  Assessment  The primary encounter diagnosis was Intercostal neuralgia (right intercostal nerve). Diagnoses of Radicular pain of thoracic region, Spinal stenosis, lumbar region, with neurogenic claudication, and Chronic pain syndrome were also pertinent to this visit.  Plan of Care    [name] has a current medication list which includes the following long-term medication(s): apixaban, atorvastatin, claritin, linaclotide, and metformin.  Discussed next steps which could include repeating right intercostal nerve block with additional steroid versus pulsed radiofrequency ablation of the right T8, T9, T10, T11 intercostal nerve.  Patient states that he will monitor his symptoms and call if he would like to proceed with nerve block or RFA.  Orders:  Orders Placed This Encounter  Procedures  . INTERCOSTAL NERVE BLOCK    Standing Status:   Standing    Number of Occurrences:   1    Standing Expiration Date:   07/24/2021    Scheduling Instructions:     Side: RIGHT T8, T9, T10, T11     Sedation: With Sedation.     TIMEFRAME: PRN procedure. (Mr. Kabir will call when needed.)    Order Specific Question:   Where will this procedure be performed?    Answer:   [facility]   Follow-up plan:   Return if symptoms worsen or fail to improve.   Recent Visits Date Type Provider Dept  01/09/21 Procedure visit Gillis Santa, MD [facility]  12/27/20 Office Visit Gillis Santa, MD [facility]  Showing recent visits within past 90 days and meeting all other requirements Today's Visits Date Type Provider Dept  01/24/21 Telemedicine Gillis Santa, MD [facility]  Showing today's visits and meeting all other requirements Future Appointments No visits were found meeting these conditions. Showing future appointments within next 90 days and meeting all other requirements  I discussed the assessment and treatment plan with the patient. The patient was provided an opportunity to ask questions and all were answered. The patient agreed with the plan and demonstrated an understanding of the instructions.  Patient advised to call back or seek an in-person evaluation if the symptoms or condition worsens.  Duration of encounter:30  minutes.  Note by: Gillis Santa, MD Date: 01/24/2021; Time: 2:44 PM

## 2021-03-18 ENCOUNTER — Encounter: Payer: Self-pay | Admitting: Student in an Organized Health Care Education/Training Program

## 2021-03-18 ENCOUNTER — Other Ambulatory Visit: Payer: Self-pay

## 2021-03-18 ENCOUNTER — Telehealth: Payer: Self-pay | Admitting: Student in an Organized Health Care Education/Training Program

## 2021-03-18 ENCOUNTER — Telehealth: Payer: Self-pay

## 2021-03-18 NOTE — Telephone Encounter (Signed)
Patient called asking to just come in for injection into his elbow. He doesn't want to pay for 2 visits.

## 2021-03-18 NOTE — Telephone Encounter (Signed)
Dr Cherylann Ratel, the patient has a virtual appointment Tuesday.  He just notified us Monday of this request.

## 2021-03-18 NOTE — Telephone Encounter (Signed)
Spoke with patients wife. She states he is not home at this time.  Requested that she have patient call the office for pre virtual appointment questions.

## 2021-03-19 ENCOUNTER — Encounter: Payer: Self-pay | Admitting: Student in an Organized Health Care Education/Training Program

## 2021-03-19 ENCOUNTER — Ambulatory Visit
Admission: RE | Admit: 2021-03-19 | Discharge: 2021-03-19 | Disposition: A | Discharge status: Home / Self Care | Payer: Medicare HMO | Attending: Student in an Organized Health Care Education/Training Program | Admitting: Student in an Organized Health Care Education/Training Program

## 2021-03-19 ENCOUNTER — Other Ambulatory Visit: Payer: Self-pay

## 2021-03-19 ENCOUNTER — Telehealth: Payer: Self-pay | Admitting: *Deleted

## 2021-03-19 ENCOUNTER — Telehealth: Payer: Self-pay

## 2021-03-19 ENCOUNTER — Ambulatory Visit
Admission: RE | Admit: 2021-03-19 | Discharge: 2021-03-19 | Disposition: A | Discharge status: Home / Self Care | Payer: Medicare HMO | Source: Ambulatory Visit | Attending: Student in an Organized Health Care Education/Training Program | Admitting: Student in an Organized Health Care Education/Training Program

## 2021-03-19 ENCOUNTER — Ambulatory Visit (HOSPITAL_BASED_OUTPATIENT_CLINIC_OR_DEPARTMENT_OTHER): Payer: Medicare HMO | Admitting: Student in an Organized Health Care Education/Training Program

## 2021-03-19 DIAGNOSIS — M79641 Pain in right hand: Secondary | ICD-10-CM | POA: Insufficient documentation

## 2021-03-19 DIAGNOSIS — M25521 Pain in right elbow: Secondary | ICD-10-CM

## 2021-03-19 DIAGNOSIS — M48062 Spinal stenosis, lumbar region with neurogenic claudication: Secondary | ICD-10-CM

## 2021-03-19 DIAGNOSIS — M7918 Myalgia, other site: Secondary | ICD-10-CM | POA: Diagnosis present

## 2021-03-19 DIAGNOSIS — G894 Chronic pain syndrome: Secondary | ICD-10-CM

## 2021-03-19 DIAGNOSIS — M5414 Radiculopathy, thoracic region: Secondary | ICD-10-CM | POA: Insufficient documentation

## 2021-03-19 NOTE — Telephone Encounter (Signed)
Spoke with Dr. Cherylann Ratel he wants patient to keep appt as is.

## 2021-03-19 NOTE — Telephone Encounter (Signed)
Tried calling patient to schedule, no answer. Left a vm

## 2021-03-19 NOTE — Telephone Encounter (Signed)
Contacted patient's friend, Katha Hamming, instructions given about today's appt., including x-rays ordered and TPI.

## 2021-03-19 NOTE — Progress Notes (Signed)
Patient: Harold Sandoval  Service Category: E/M  Provider: Gillis Santa, MD  DOB: 18-Oct-1941  DOS: 03/19/2021  Location: Office  MRN: [id number]  Setting: Ambulatory outpatient  Referring Provider: Baxter Hire, MD  Type: Established Patient  Specialty: Interventional Pain Management  PCP: Baxter Hire, MD  Location: Home  Delivery: TeleHealth     Virtual Encounter - Pain Management PROVIDER NOTE: Information contained herein reflects review and annotations entered in association with encounter. Interpretation of such information and data should be left to medically-trained personnel. Information provided to patient can be located elsewhere in the medical record under "Patient Instructions". Document created using STT-dictation technology, any transcriptional errors that may result from process are unintentional.    Contact & Pharmacy Preferred: [phone] Home: [phone] (home) Mobile: [phone] (mobile) E-mail: [email or web address]  [facility] #[id number]Phone: [phone]: [phone]  Pre-screening  Harold Sandoval "in-person" vs "virtual" encounter. He indicated preferring virtual for this encounter.   Reason COVID-19*  Social distancing based on CDC and AMA recommendations.   I contacted Harold Cushingon 03/19/2021 via video conference.      I clearly identified myself as B17/11/2021 MD. I verified that I was speaking with the correct person using two identifiers (Name: Harold Sandoval and date of birth: 82-25-1942.  Consent I sought verbal advanced consent from W82/08/1943for virtual visit interactions. I informed Mr. B82ntof possible security and privacy concerns, risks, and limitations associated with providing "not-in-person" medical evaluation and management services. I also informed Mr. BTrippeof the availability of "in-person" appointments. Finally, I informed him that  there would be a charge for the virtual visit and that he could be  personally, fully or partially, financially responsible for it. Mr. B82rossexpressed understanding and agreed to proceed.   Historic Elements   Mr. WRAMADAN COUEYis a 80y.o. year old, male patient evaluated today after our last contact on 03/18/2021. 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 Harold Sandoval a current medication list which includes the following prescription(s): apixaban, atorvastatin, brimonidine, brinzolamide, vitamin d3, claritin, desonide, fluticasone, gabapentin, latanoprost, metformin, and linaclotide. He  reports that he has never smoked. He has never used smokeless tobacco. He reports that he does not drink alcohol and does not use drugs. Harold Sandoval allergic to lactose intolerance (gi).   HPI  Today, he is being contacted for new problems.  Patient is complaining of increased right elbow pain as well as pain in his right middle finger.  No inciting or traumatic event.  His elbow pain does not radiate.  This could be a trigger point.  On the safe side, we will obtain x-rays of his right hand and right elbow.  We will plan for  right elbow and right middle finger trigger point injection.  Risks  and benefits reviewed and patient would like to proceed.   Laboratory Chemistry Profile   Renal Lab Results  Component Value Date   BUN 11 02/26/2019   CREATININE 0.79 02/26/2019   GFRAA >60 02/26/2019   GFRNONAA >60 02/26/2019     Hepatic Lab Results  Component Value Date   AST 20 02/26/2019   ALT 18 02/26/2019   ALBUMIN 4.1 02/26/2019   ALKPHOS 79 02/26/2019   AMMONIA 9 09/06/2018     Electrolytes Lab Results  Component Value Date   NA 139 02/26/2019   K 3.5 02/26/2019   CL 102 02/26/2019   CALCIUM 9.2 02/26/2019     Bone No results found for: VD25OH, VD125OH2TOT, IW8032ZY2, QM2500BB0, 25OHVITD1, 25OHVITD2, 25OHVITD3, TESTOFREE, TESTOSTERONE   Inflammation (CRP: Acute Phase) (ESR: Chronic Phase) No results found for: CRP, ESRSEDRATE, LATICACIDVEN     Note: Above Lab results reviewed.  Assessment  The primary encounter diagnosis was Right elbow pain. Diagnoses of Myofascial pain, Right hand pain, Spinal stenosis, lumbar region, with neurogenic claudication, Thoracic radiculopathy (T2/3), and Chronic pain syndrome were also pertinent to this visit.  Plan of Care   1. Right elbow pain - DG ELBOW COMPLETE RIGHT (3+VIEW); Future - DG Hand Complete Right; Future - TRIGGER POINT INJECTION; Future  2. Myofascial pain - DG ELBOW COMPLETE RIGHT (3+VIEW); Future - DG Hand Complete Right; Future - TRIGGER POINT INJECTION; Future  3. Right hand pain - DG ELBOW COMPLETE RIGHT (3+VIEW); Future - DG Hand Complete Right; Future - TRIGGER POINT INJECTION; Future  4. Spinal stenosis, lumbar region, with neurogenic claudication -s/p L2/3 ESI #1 on 09/10/20, helped repeat pRN  5. Thoracic radiculopathy (T2/3) -s/p T1/T2 ESI on 05/30/20  6. Chronic pain syndrome - DG ELBOW COMPLETE RIGHT (3+VIEW); Future - DG Hand Complete Right; Future - TRIGGER POINT INJECTION; Future   Orders:  Orders Placed This  Encounter  Procedures  . TRIGGER POINT INJECTION    Standing Status:   Future    Standing Expiration Date:   06/18/2021    Scheduling Instructions:     Right elbow, right middle finger    Order Specific Question:   Where will this procedure be performed?    Answer:   [facility]  . DG ELBOW COMPLETE RIGHT (3+VIEW)    Standing Status:   Future    Standing Expiration Date:   09/18/2021    Order Specific Question:   Reason for Exam (SYMPTOM  OR DIAGNOSIS REQUIRED)    Answer:   right elbow pain    Order Specific Question:   Preferred imaging location?    Answer:   [facility]    Order Specific Question:   Radiology Contrast Protocol - do NOT remove file path    Answer:   \\[email or web address]\Radiant\DXFluoroContrastProtocols.pdf  . DG Hand Complete Right    Standing Status:   Future    Standing Expiration Date:   09/18/2021    Order Specific Question:   Reason for Exam (SYMPTOM  OR DIAGNOSIS REQUIRED)    Answer:   right middle finger pain    Order Specific Question:   Preferred imaging location?    Answer[facility]    [facility]    Order Specific Question:   Radiology Contrast Protocol - do NOT remove file path    Answer:   \\[email or web address]\Radiant\DXFluoroContrastProtocols.pdf   Follow-up plan:   Return in about 1 week (around 03/26/2021) for R elbow and finger TPI.   Recent Visits Date Type Provider Dept  01/24/21 Telemedicine [place],  Carlus Pavlov, MD [facility]  01/09/21 Procedure visit Gillis Santa, MD [facility]  12/27/20 Office Visit Gillis Santa, MD [facility]  Showing recent visits within past 90 days and meeting all other requirements Today's Visits Date Type Provider Dept  03/19/21 Telemedicine Gillis Santa, MD [facility]  Showing today's visits and meeting all other requirements Future Appointments No visits were found meeting these conditions. Showing future appointments within next 90 days and meeting all other  requirements  I discussed the assessment and treatment plan with the patient. The patient was provided an opportunity to ask questions and all were answered. The patient agreed with the plan and demonstrated an understanding of the instructions.  Patient advised to call back or seek an in-person evaluation if the symptoms or condition worsens.  Duration of encounter: 55mnutes.  Note by: BGillis Santa MD Date: 03/19/2021; Time: 1:16 PM

## 2021-03-25 ENCOUNTER — Other Ambulatory Visit: Payer: Self-pay

## 2021-03-25 ENCOUNTER — Encounter: Payer: Self-pay | Admitting: Student in an Organized Health Care Education/Training Program

## 2021-03-25 ENCOUNTER — Ambulatory Visit
Payer: Medicare HMO | Attending: Student in an Organized Health Care Education/Training Program | Admitting: Student in an Organized Health Care Education/Training Program

## 2021-03-25 VITALS — BP 142/82 | HR 76 | Temp 97.2°F | Ht 76.0 in | Wt 213.0 lb

## 2021-03-25 DIAGNOSIS — M25521 Pain in right elbow: Secondary | ICD-10-CM | POA: Diagnosis not present

## 2021-03-25 DIAGNOSIS — G894 Chronic pain syndrome: Secondary | ICD-10-CM | POA: Diagnosis not present

## 2021-03-25 DIAGNOSIS — M79641 Pain in right hand: Secondary | ICD-10-CM | POA: Diagnosis present

## 2021-03-25 DIAGNOSIS — M7918 Myalgia, other site: Secondary | ICD-10-CM | POA: Diagnosis present

## 2021-03-25 DIAGNOSIS — M1712 Unilateral primary osteoarthritis, left knee: Secondary | ICD-10-CM | POA: Insufficient documentation

## 2021-03-25 MED ORDER — DEXAMETHASONE SODIUM PHOSPHATE 10 MG/ML IJ SOLN
INTRAMUSCULAR | Status: AC
  Filled 2021-03-25: qty 1

## 2021-03-25 MED ORDER — DEXAMETHASONE SODIUM PHOSPHATE 10 MG/ML IJ SOLN
10.0000 mg | Freq: Once | INTRAMUSCULAR | Status: AC
  Administered 2021-03-25: 10 mg

## 2021-03-25 MED ORDER — LIDOCAINE HCL 2 % IJ SOLN
INTRAMUSCULAR | Status: AC
  Filled 2021-03-25: qty 20

## 2021-03-25 MED ORDER — LIDOCAINE HCL 2 % IJ SOLN
20.0000 mL | Freq: Once | INTRAMUSCULAR | Status: AC
  Administered 2021-03-25: 400 mg

## 2021-03-25 NOTE — Progress Notes (Signed)
Safety precautions to be maintained throughout the outpatient stay will include: orient to surroundings, keep bed in low position, maintain call bell within reach at all times, provide assistance with transfer out of bed and ambulation.  

## 2021-03-25 NOTE — Progress Notes (Signed)
PROVIDER NOTE: Information contained herein reflects review and annotations entered in association with encounter. Interpretation of such information and data should be left to medically-trained personnel. Information provided to patient can be located elsewhere in the medical record under "Patient Instructions". Document created using STT-dictation technology, any transcriptional errors that may result from process are unintentional.    Patient: Harold Sandoval  Service Category: Procedure  Provider: Edward Jolly, MD  DOB: 05/09/1941  DOS: 03/25/2021  Location: [facility]  MRN: [id number]  Setting: Ambulatory - outpatient  Referring Provider: Gracelyn Nurse, MD  Type: Established Patient  Specialty: Interventional Pain Management  PCP: Gracelyn Nurse, MD   Primary Reason for Visit: Interventional Pain Management Treatment. CC: Elbow Pain  Procedure:          Anesthesia, Analgesia, Anxiolysis:  Type: Trigger Point Injection (1-2 muscle groups) #1  CPT: 20552 Right elbow and right middle finger  Type: Local Anesthesia    Indications: 1. Right elbow pain   2. Right hand pain   3. Myofascial pain   4. Chronic pain syndrome   5. Primary osteoarthritis of left knee    Pain Score: Pre-procedure: 5 /10 Post-procedure:3/10   Pre-op H&P Assessment:  Mr. Ariola is a 80 y.o. (year old), male patient, seen today for interventional treatment. 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 Mr. Pumphrey has a current medication list which includes the following prescription(s): apixaban, atorvastatin, brimonidine, brinzolamide, vitamin d3,  claritin, desonide, fluticasone, gabapentin, latanoprost, metformin, and linaclotide. His primarily concern today is the Elbow Pain  Initial Vital Signs:  Pulse/HCG Rate: 76  Temp: (!) 97.2 F (36.2 C) Resp:   BP: (!) 142/82 SpO2: 100 %  BMI: Estimated body mass index is 25.93 kg/m as calculated from the following:   Height as of this encounter: 6\' 4"  (1.93 m).   Weight as of this encounter: 213 lb (96.6 kg).  Risk Assessment: Allergies: Reviewed. 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  Allergy Precautions: None required Coagulopathies: Reviewed. None identified.  Blood-thinner therapy: None at this time Active Infection(s): Reviewed. None identified. Mr. Vacha is afebrile  Site Confirmation: Mr. Winstanley was asked to confirm the procedure and laterality before marking the site Procedure checklist: Completed Consent: Before the procedure and under the influence of no sedative(s), amnesic(s), or anxiolytics, the patient was informed of the treatment options, risks and possible complications. To fulfill our ethical and legal obligations, as recommended by the American Medical Association's Code of Ethics, I have informed the patient of my clinical impression; the nature and purpose of the treatment or procedure; the risks, benefits, and possible complications of the intervention; the alternatives, including doing nothing; the risk(s) and benefit(s) of the alternative treatment(s) or procedure(s); and the risk(s) and benefit(s) of doing nothing. The patient was provided information about the general risks and possible complications associated with the procedure. These may include, but are not limited to: failure to achieve desired goals, infection, bleeding, organ or nerve damage, allergic reactions, paralysis, and death. In addition, the patient was informed of those risks and complications associated to the procedure, such as failure to decrease pain; infection; bleeding; organ or  nerve damage with subsequent damage to sensory, motor, and/or autonomic systems, resulting in permanent pain,  numbness, and/or weakness of one or several areas of the body; allergic reactions; (i.e.: anaphylactic reaction); and/or death. Furthermore, the patient was informed of those risks and complications associated with the medications. These include, but are not limited to: allergic reactions (i.e.: anaphylactic or anaphylactoid reaction(s)); adrenal axis suppression; blood sugar elevation that in diabetics may result in ketoacidosis or comma; water retention that in patients with history of congestive heart failure may result in shortness of breath, pulmonary edema, and decompensation with resultant heart failure; weight gain; swelling or edema; medication-induced neural toxicity; particulate matter embolism and blood vessel occlusion with resultant organ, and/or nervous system infarction; and/or aseptic necrosis of one or more joints. Finally, the patient was informed that Medicine is not an exact science; therefore, there is also the possibility of unforeseen or unpredictable risks and/or possible complications that may result in a catastrophic outcome. The patient indicated having understood very clearly. We have given the patient no guarantees and we have made no promises. Enough time was given to the patient to ask questions, all of which were answered to the patient's satisfaction. Mr. Schleifer has indicated that he wanted to continue with the procedure. Attestation: I, the ordering provider, attest that I have discussed with the patient the benefits, risks, side-effects, alternatives, likelihood of achieving goals, and potential problems during recovery for the procedure that I have provided informed consent. Date  Time: 03/25/2021 10:38 AM  Pre-Procedure Preparation:  Monitoring: As per clinic protocol. Respiration, ETCO2, SpO2, BP, heart rate and rhythm monitor placed and checked for adequate  function Safety Precautions: Patient was assessed for positional comfort and pressure points before starting the procedure. Time-out: I initiated and conducted the "Time-out" before starting the procedure, as per protocol. The patient was asked to participate by confirming the accuracy of the "Time Out" information. Verification of the correct person, site, and procedure were performed and confirmed by me, the nursing staff, and the patient. "Time-out" conducted as per Joint Commission's Universal Protocol (UP.01.01.01). Time: 1100  Description of Procedure:          Area Prepped: Entire             Region DuraPrep (Iodine Povacrylex [0.7% available iodine] and Isopropyl Alcohol, 74% w/w) Safety Precautions: Aspiration looking for blood return was conducted prior to all injections. At no point did we inject any substances, as a needle was being advanced. No attempts were made at seeking any paresthesias. Safe injection practices and needle disposal techniques used. Medications properly checked for expiration dates. SDV (single dose vial) medications used. Description of the Procedure: Protocol guidelines were followed. The patient was placed in position over the fluoroscopy table. The target area was identified and the area prepped in the usual manner. Skin & deeper tissues infiltrated with local anesthetic. Appropriate amount of time allowed to pass for local anesthetics to take effect. The procedure needles were then advanced to the target area. Proper needle placement secured. Negative aspiration confirmed. Solution injected in intermittent fashion, asking for systemic symptoms every 0.5cc of injectate. The needles were then removed and the area cleansed, making sure to leave some of the prepping solution back to take advantage of its long term bactericidal properties.  Vitals:   03/25/21 1043  BP: (!) 142/82  Pulse: 76  Temp: (!) 97.2 F (36.2 C)  SpO2: 100%  Weight: 213 lb (96.6 kg)  Height:  6\' 4"  (1.93 m)    Start Time: 1100 hrs. End Time: 1104 hrs. Materials:  Needle(s) Type: Regular needle  Gauge: 27G Length: 1.5-in Medication(s): Please see orders for medications and dosing details. 3 cc solution made of 2 cc of 0.2% ropivacaine, 1 cc of Decadron 10 mg/cc.  1.5 cc injected along the right middle finger TP, 1.5 cc injected along the right elbow TP  Post-operative Assessment:  Post-procedure Vital Signs:  Pulse/HCG Rate: 76  Temp: (!) 97.2 F (36.2 C) Resp:   BP: (!) 142/82 SpO2: 100 %  EBL: None  Complications: No immediate post-treatment complications observed by team, or reported by patient.  Note: The patient tolerated the entire procedure well. A repeat set of vitals were taken after the procedure and the patient was kept under observation following institutional policy, for this type of procedure. Post-procedural neurological assessment was performed, showing return to baseline, prior to discharge. The patient was provided with post-procedure discharge instructions, including a section on how to identify potential problems. Should any problems arise concerning this procedure, the patient was given instructions to immediately contact us, at any time, without hesitation. In any case, we plan to contact the patient by telephone for a follow-up status report regarding this interventional procedure.  Comments:  No additional relevant information.  Plan of Care  Orders:  Orders Placed This Encounter  Procedures  . KNEE INJECTION    Local Anesthetic & Steroid injection.    Standing Status:   Future    Standing Expiration Date:   06/24/2021    Scheduling Instructions:     Side: LEFT     Sedation: None     Timeframe: As soon as schedule allows    Order Specific Question:   Where will this procedure be performed?    Answer:   [facility]   Mr Christin BachBiegelow is complaining of increased left knee pain, worse with weight bearing. Has benefited from left knee  injection in past and is requesting repeat injection.  Medications ordered for procedure: Meds ordered this encounter  Medications  . dexamethasone (DECADRON) injection 10 mg  . lidocaine (XYLOCAINE) 2 % (with pres) injection 400 mg   Medications administered: We administered dexamethasone and lidocaine.  See the medical record for exact dosing, route, and time of administration.  Follow-up plan:   Return in about 2 weeks (around 04/08/2021) for Left Knee steroid.    Recent Visits Date Type Provider Dept  03/19/21 Telemedicine Edward JollyLateef, Rodd Heft, MD [facility]  01/24/21 Telemedicine Edward JollyLateef, Nusaybah Ivie, MD [facility]  01/09/21 Procedure visit Edward JollyLateef, Ailyn Gladd, MD [facility]  12/27/20 Office Visit Edward JollyLateef, Teoman Giraud, MD [facility]  Showing recent visits within past 90 days and meeting all other requirements Today's Visits Date Type Provider Dept  03/25/21 Procedure visit Edward JollyLateef, Yareth Macdonnell, MD [facility]  Showing today's visits and meeting all other requirements Future Appointments No visits were found meeting these conditions. Showing future appointments within next 90 days and meeting all other requirements  Disposition: Discharge home  Discharge (Date  Time): 03/25/2021; 1107 hrs.   Primary Care Physician: Gracelyn NurseJohnston, [name], MD [facility] Note by: Edward JollyBilal Roanna Reaves, MD Date: 03/25/2021; Time: 11:27 AM  Disclaimer:  Medicine is not an exact science. The only guarantee in medicine is that nothing is guaranteed. It is important to note that the decision to proceed with this intervention was based on the information collected from the patient. The Data and conclusions were drawn from the patient's questionnaire, the interview, and the physical examination. Because the information was provided in large part by the patient,  it cannot be guaranteed that it has not been purposely or unconsciously manipulated. Every effort  has been made to obtain as much relevant data as possible for this evaluation. It is important to note that the conclusions that lead to this procedure are derived in large part from the available data. Always take into account that the treatment will also be dependent on availability of resources and existing treatment guidelines, considered by other Pain Management Practitioners as being common knowledge and practice, at the time of the intervention. For Medico-Legal purposes, it is also important to point out that variation in procedural techniques and pharmacological choices are the acceptable norm. The indications, contraindications, technique, and results of the above procedure should only be interpreted and judged by a Board-Certified Interventional Pain Specialist with extensive familiarity and expertise in the same exact procedure and technique.

## 2021-03-25 NOTE — Patient Instructions (Signed)

## 2021-03-26 ENCOUNTER — Telehealth: Payer: Self-pay | Admitting: *Deleted

## 2021-03-26 NOTE — Telephone Encounter (Signed)
No problems post procedure. 

## 2021-04-01 ENCOUNTER — Ambulatory Visit
Payer: Medicare HMO | Attending: Student in an Organized Health Care Education/Training Program | Admitting: Student in an Organized Health Care Education/Training Program

## 2021-04-01 ENCOUNTER — Other Ambulatory Visit: Payer: Self-pay

## 2021-04-01 ENCOUNTER — Encounter: Payer: Self-pay | Admitting: Student in an Organized Health Care Education/Training Program

## 2021-04-01 DIAGNOSIS — M1712 Unilateral primary osteoarthritis, left knee: Secondary | ICD-10-CM | POA: Diagnosis present

## 2021-04-01 DIAGNOSIS — G894 Chronic pain syndrome: Secondary | ICD-10-CM | POA: Insufficient documentation

## 2021-04-01 MED ORDER — METHYLPREDNISOLONE ACETATE 40 MG/ML IJ SUSP
40.0000 mg | Freq: Once | INTRAMUSCULAR | Status: AC
  Administered 2021-04-01: 40 mg via INTRA_ARTICULAR

## 2021-04-01 MED ORDER — LIDOCAINE HCL 2 % IJ SOLN
20.0000 mL | Freq: Once | INTRAMUSCULAR | Status: AC
  Administered 2021-04-01: 100 mg

## 2021-04-01 MED ORDER — LIDOCAINE HCL (PF) 2 % IJ SOLN
INTRAMUSCULAR | Status: AC
  Filled 2021-04-01: qty 5

## 2021-04-01 MED ORDER — ROPIVACAINE HCL 2 MG/ML IJ SOLN
4.0000 mL | Freq: Once | INTRAMUSCULAR | Status: AC
  Administered 2021-04-01: 4 mL via INTRA_ARTICULAR

## 2021-04-01 MED ORDER — ROPIVACAINE HCL 2 MG/ML IJ SOLN
INTRAMUSCULAR | Status: AC
  Filled 2021-04-01: qty 10

## 2021-04-01 MED ORDER — METHYLPREDNISOLONE ACETATE 40 MG/ML IJ SUSP
INTRAMUSCULAR | Status: AC
  Filled 2021-04-01: qty 1

## 2021-04-01 NOTE — Patient Instructions (Signed)

## 2021-04-01 NOTE — Progress Notes (Signed)
PROVIDER NOTE: Information contained herein reflects review and annotations entered in association with encounter. Interpretation of such information and data should be left to medically-trained personnel. Information provided to patient can be located elsewhere in the medical record under "Patient Instructions". Document created using STT-dictation technology, any transcriptional errors that may result from process are unintentional.    Patient: Harold Sandoval  Service Category: Procedure  Provider: Edward Jolly, MD  DOB: 06-19-1941  DOS: 04/01/2021  Location: [facility]  MRN: [id number]  Setting: Ambulatory - outpatient  Referring Provider: Edward Jolly, MD  Type: Established Patient  Specialty: Interventional Pain Management  PCP: Gracelyn Nurse, MD   Primary Reason for Visit: Interventional Pain Management Treatment. CC: Knee Pain (Left )  Procedure:          Anesthesia, Analgesia, Anxiolysis:  Type: Diagnostic Intra-Articular Local anesthetic and steroid Knee Injection #1  Region: Medial infrapatellar Knee Region Level: Knee Joint Laterality: Left knee  Type: Local Anesthesia Indication(s): Analgesia         Local Anesthetic: Lidocaine 1-2% Route: Infiltration ([place]/IM) IV Access: Declined Sedation: Declined   Position: Sitting   Indications: 1. Primary osteoarthritis of left knee   2. Chronic pain syndrome    Pain Score: Pre-procedure: 4 /10 Post-procedure: 4 /10   Pre-op H&P Assessment:  Mr. Bon is a 80 y.o. (year old), male patient, seen today for interventional treatment. 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 Mr. Golob has a current medication list which includes the following prescription(s): apixaban, atorvastatin, brimonidine, brinzolamide, vitamin d3, claritin, desonide, fluticasone, gabapentin, latanoprost, metformin, and linaclotide. His primarily concern today is the Knee Pain (Left )  Initial Vital Signs:  Pulse/HCG Rate: 89  Temp: (!) 97.2 F (36.2 C) Resp: 16 BP: (!) 149/85 SpO2: 100 %  BMI: Estimated body mass index is 25.93 kg/m as calculated from the following:   Height as of this encounter: 6\' 4"  (1.93 m).   Weight as of this encounter: 213 lb (96.6 kg).  Risk Assessment: Allergies: Reviewed. 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  Allergy Precautions: None required Coagulopathies: Reviewed. None identified.  Blood-thinner therapy: None at this time Active Infection(s): Reviewed. None identified. Mr. Carcione is afebrile  Site Confirmation: Mr. Reidel was asked to confirm the procedure and laterality before marking the site Procedure checklist: Completed Consent: Before the procedure and under the influence of no sedative(s), amnesic(s), or anxiolytics, the patient was informed of the treatment options, risks and possible complications. To fulfill our ethical and legal obligations, as recommended by the American Medical Association's Code of Ethics, I have informed the patient of my clinical impression; the nature and purpose of the treatment or procedure; the risks, benefits, and possible complications of the intervention; the alternatives, including doing nothing; the risk(s) and benefit(s) of the alternative treatment(s) or procedure(s); and the risk(s) and benefit(s) of doing nothing. The patient was provided information about the general risks and possible complications associated with the procedure. These may include, but are not limited to: failure to achieve desired goals, infection, bleeding, organ or nerve damage, allergic reactions, paralysis, and  death. In addition, the patient was informed of those risks and complications associated to the procedure, such as failure to decrease pain; infection; bleeding;  organ or nerve damage with subsequent damage to sensory, motor, and/or autonomic systems, resulting in permanent pain, numbness, and/or weakness of one or several areas of the body; allergic reactions; (i.e.: anaphylactic reaction); and/or death. Furthermore, the patient was informed of those risks and complications associated with the medications. These include, but are not limited to: allergic reactions (i.e.: anaphylactic or anaphylactoid reaction(s)); adrenal axis suppression; blood sugar elevation that in diabetics may result in ketoacidosis or comma; water retention that in patients with history of congestive heart failure may result in shortness of breath, pulmonary edema, and decompensation with resultant heart failure; weight gain; swelling or edema; medication-induced neural toxicity; particulate matter embolism and blood vessel occlusion with resultant organ, and/or nervous system infarction; and/or aseptic necrosis of one or more joints. Finally, the patient was informed that Medicine is not an exact science; therefore, there is also the possibility of unforeseen or unpredictable risks and/or possible complications that may result in a catastrophic outcome. The patient indicated having understood very clearly. We have given the patient no guarantees and we have made no promises. Enough time was given to the patient to ask questions, all of which were answered to the patient's satisfaction. Mr. Albanese has indicated that he wanted to continue with the procedure. Attestation: I, the ordering provider, attest that I have discussed with the patient the benefits, risks, side-effects, alternatives, likelihood of achieving goals, and potential problems during recovery for the procedure that I have provided informed consent. Date  Time: 04/01/2021  10:39 AM  Pre-Procedure Preparation:  Monitoring: As per clinic protocol. Respiration, ETCO2, SpO2, BP, heart rate and rhythm monitor placed and checked for adequate function Safety Precautions: Patient was assessed for positional comfort and pressure points before starting the procedure. Time-out: I initiated and conducted the "Time-out" before starting the procedure, as per protocol. The patient was asked to participate by confirming the accuracy of the "Time Out" information. Verification of the correct person, site, and procedure were performed and confirmed by me, the nursing staff, and the patient. "Time-out" conducted as per Joint Commission's Universal Protocol (UP.01.01.01). Time: 1103  Description of Procedure:          Target Area: Knee Joint Approach: Just above the Medial tibial plateau, lateral to the infrapatellar tendon. Area Prepped: Entire knee area, from the mid-thigh to the mid-shin. DuraPrep (Iodine Povacrylex [0.7% available iodine] and Isopropyl Alcohol, 74% w/w) Safety Precautions: Aspiration looking for blood return was conducted prior to all injections. At no point did we inject any substances, as a needle was being advanced. No attempts were made at seeking any paresthesias. Safe injection practices and needle disposal techniques used. Medications properly checked for expiration dates. SDV (single dose vial) medications used. Description of the Procedure: Protocol guidelines were followed. The patient was placed in position over the fluoroscopy table. The target area was identified and the area prepped in the usual manner. Skin & deeper tissues infiltrated with local anesthetic. Appropriate amount of time allowed to pass for local anesthetics to take effect. The procedure needles were then advanced to the target area. Proper needle placement secured. Negative aspiration confirmed. Solution injected in intermittent fashion, asking for systemic symptoms every 0.5cc of  injectate. The needles were then removed and the area cleansed, making sure to leave some of the prepping solution back to take advantage of its long term bactericidal properties. Vitals:   04/01/21 1043  BP: (!) 149/85  Pulse: 89  Resp: 16  Temp: (!) 97.2 F (36.2 C)  TempSrc:  Temporal  SpO2: 100%  Weight: 213 lb (96.6 kg)  Height: 6\' 4"  (1.93 m)    Start Time: 1103 hrs. End Time: 1106 hrs. Materials:  Needle(s) Type: Regular needle Gauge: 25G Length: 1.5-in Medication(s): Please see orders for medications and dosing details. 5 cc solution made of 4 cc of 0.2% ropivacaine, 1 cc of methylprednisolone, 40 mg/cc.  Post-operative Assessment:  Post-procedure Vital Signs:  Pulse/HCG Rate: 89  Temp: (!) 97.2 F (36.2 C) Resp: 16 BP: (!) 149/85 SpO2: 100 %  EBL: None  Complications: No immediate post-treatment complications observed by team, or reported by patient.  Note: The patient tolerated the entire procedure well. A repeat set of vitals were taken after the procedure and the patient was kept under observation following institutional policy, for this type of procedure. Post-procedural neurological assessment was performed, showing return to baseline, prior to discharge. The patient was provided with post-procedure discharge instructions, including a section on how to identify potential problems. Should any problems arise concerning this procedure, the patient was given instructions to immediately contact , at any time, without hesitation. In any case, we plan to contact the patient by telephone for a follow-up status report regarding this interventional procedure.  Comments:  No additional relevant information.  Plan of Care   Medications ordered for procedure: Meds ordered this encounter  Medications  . ropivacaine (PF) 2 mg/mL (0.2%) (NAROPIN) injection 4 mL  . methylPREDNISolone acetate (DEPO-MEDROL) injection 40 mg  . lidocaine (XYLOCAINE) 2 % (with pres) injection  400 mg   Medications administered: We administered ropivacaine (PF) 2 mg/mL (0.2%), methylPREDNISolone acetate, and lidocaine.  See the medical record for exact dosing, route, and time of administration.  Follow-up plan:   Return in about 4 weeks (around 04/29/2021) for Post Procedure Evaluation, virtual.    Recent Visits Date Type Provider Dept  03/25/21 Procedure visit 03/27/21, MD [facility]  03/19/21 Telemedicine 05/19/21, MD [facility]  01/24/21 Telemedicine 01/26/21, MD [facility]  01/09/21 Procedure visit 03/09/21, MD [facility]  Showing recent visits within past 90 days and meeting all other requirements Today's Visits Date Type Provider Dept  04/01/21 Procedure visit 04/03/21, MD [facility]  Showing today's visits and meeting all other requirements Future Appointments Date Type Provider Dept  04/29/21 Appointment 05/01/21, MD [facility]  Showing future appointments within next 90 days and meeting all other requirements  Disposition: Discharge home  Discharge (Date  Time): 04/01/2021; 1108 hrs.   Primary Care Physician: 04/03/2021, MD Location: [facility] Note by: [facility], MD Date: 04/01/2021; Time: 11:20 AM  Disclaimer:  Medicine is not an exact science. The only guarantee in medicine is that nothing is guaranteed. It is important to note that the decision to proceed with this intervention was based on the information collected from the patient. The Data and conclusions were drawn from the patient's questionnaire, the interview, and the physical examination. Because the information was provided in large part by the patient, it cannot be guaranteed that it has not been purposely or unconsciously manipulated. Every effort has been made to obtain as much relevant data as possible for this evaluation. It is important to note that the conclusions  that lead to this procedure are derived in large part from the available data. Always take into account that the treatment will also be dependent on availability of resources and existing treatment guidelines, considered by other Pain  Management Practitioners as being common knowledge and practice, at the time of the intervention. For Medico-Legal purposes, it is also important to point out that variation in procedural techniques and pharmacological choices are the acceptable norm. The indications, contraindications, technique, and results of the above procedure should only be interpreted and judged by a Board-Certified Interventional Pain Specialist with extensive familiarity and expertise in the same exact procedure and technique.

## 2021-04-01 NOTE — Progress Notes (Signed)
Safety precautions to be maintained throughout the outpatient stay will include: orient to surroundings, keep bed in low position, maintain call bell within reach at all times, provide assistance with transfer out of bed and ambulation.  

## 2021-04-02 ENCOUNTER — Telehealth: Payer: Self-pay

## 2021-04-02 NOTE — Telephone Encounter (Signed)
PP called, No mailbox to leave a message, no answer.

## 2021-04-25 ENCOUNTER — Encounter: Payer: Self-pay | Admitting: Student in an Organized Health Care Education/Training Program

## 2021-04-29 ENCOUNTER — Ambulatory Visit
Payer: Medicare HMO | Attending: Student in an Organized Health Care Education/Training Program | Admitting: Student in an Organized Health Care Education/Training Program

## 2021-04-29 ENCOUNTER — Encounter: Payer: Self-pay | Admitting: Student in an Organized Health Care Education/Training Program

## 2021-04-29 ENCOUNTER — Other Ambulatory Visit: Payer: Self-pay

## 2021-04-29 DIAGNOSIS — G894 Chronic pain syndrome: Secondary | ICD-10-CM | POA: Diagnosis not present

## 2021-04-29 DIAGNOSIS — M1712 Unilateral primary osteoarthritis, left knee: Secondary | ICD-10-CM | POA: Diagnosis not present

## 2021-04-29 MED ORDER — DICLOFENAC SODIUM 1 % EX GEL: 4.0000 g | Freq: Four times a day (QID) | CUTANEOUS | 99 refills | Status: AC | PRN

## 2021-04-29 NOTE — Patient Instructions (Signed)
left

## 2021-04-29 NOTE — Progress Notes (Signed)
Patient: Harold Sandoval  Service Category: E/M  Provider: Gillis Santa, MD  DOB: 12/02/1941  DOS: 04/29/2021  Location: Office  MRN: [id number]  Setting: Ambulatory outpatient  Referring Provider: Baxter Hire, MD  Type: Established Patient  Specialty: Interventional Pain Management  PCP: Baxter Hire, MD  Location: Home  Delivery: TeleHealth     Virtual Encounter - Pain Management PROVIDER NOTE: Information contained herein reflects review and annotations entered in association with encounter. Interpretation of such information and data should be left to medically-trained personnel. Information provided to patient can be located elsewhere in the medical record under "Patient Instructions". Document created using STT-dictation technology, any transcriptional errors that may result from process are unintentional.    Contact & Pharmacy Preferred: [phone] Home: [phone] (home) Mobile: [phone] (mobile) E-mail: [email or web address]  [id number] [id number]: [phone]: [phone]  Pre-screening  Mr. BMachucaoffered "in-person" vs "virtual" encounter. He indicated preferring virtual for this encounter.   Reason COVID-19*  Social distancing based on CDC and AMA recommendations.   I contacted Harold Ratelon 04/29/2021 via video conference.      I clearly identified myself as BGillis Santa MD. I verified that I was speaking with the correct person using two identifiers (Name: Harold Sandoval and date of birth: [birth year].  Consent I sought verbal advanced consent from Harold Sandoval. I informed Harold Sandoval, risks, and limitations associated with providing "not-in-person" medical evaluation and management services. I also informed Harold Sandoval. Finally, I informed him that  there would be a charge for the virtual visit and that he could be  personally, fully or partially, financially responsible for it. Harold Sandoval and agreed to proceed.   Historic Elements   Mr. WEULON ALLNUTTis a 80y.o. year old, male patient evaluated today after our last contact on 04/01/2021. Mr. 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 Mr. BCameyhas a current medication list which includes the following prescription(s): apixaban, atorvastatin, brimonidine, brinzolamide, vitamin d3, claritin, desonide, diclofenac sodium, fluticasone, gabapentin, latanoprost, metformin, and linaclotide. He  reports that he has never smoked. He has never used smokeless tobacco. He reports that he does not drink alcohol and does not use drugs. Mr. BGinyardis allergic to lactose intolerance (gi).   HPI  Today, he is being contacted for a post-procedure assessment.   Post-Procedure Evaluation  Procedure (04/01/2021): Type: Diagnostic Intra-Articular Local anesthetic and steroid Knee Injection #1  Region: Medial infrapatellar Knee Region Level: Knee Joint  Laterality: Left knee  Sedation: Please see nurses note.   Effectiveness during initial hour after procedure(Ultra-Short Term Relief): 75 %   Local anesthetic used: Long-acting (4-6 hours) Effectiveness: Defined as any analgesic benefit obtained secondary to the administration of  local anesthetics. This carries significant diagnostic value as to the etiological location, or anatomical origin, of the pain. Duration of benefit is expected to coincide with the duration of the local anesthetic used.  Effectiveness during initial 4-6 hours after procedure(Short-Term Relief): 75 %   Long-term benefit: Defined as any relief past the pharmacologic duration of the local anesthetics.  Effectiveness past the initial 6 hours after procedure(Long-Term Relief): 0 %  Current benefits: Defined as benefit that persist at this time.   Analgesia:  Back to baseline   Laboratory Chemistry Profile   Renal Lab Results  Component Value Date   BUN 11 02/26/2019   CREATININE 0.79 02/26/2019   GFRAA >60 02/26/2019   GFRNONAA >60 02/26/2019     Hepatic Lab Results  Component Value Date   AST 20 02/26/2019   ALT 18 02/26/2019   ALBUMIN 4.1 02/26/2019   ALKPHOS 79 02/26/2019   AMMONIA 9 09/06/2018     Electrolytes Lab Results  Component Value Date   NA 139 02/26/2019   K 3.5 02/26/2019   CL 102 02/26/2019   CALCIUM 9.2 02/26/2019     Bone No results found for: VD25OH, VD125OH2TOT, VW0981XB1, YN8295AO1, 25OHVITD1, 25OHVITD2, 25OHVITD3, TESTOFREE, TESTOSTERONE   Inflammation (CRP: Acute Phase) (ESR: Chronic Phase) No results found for: CRP, ESRSEDRATE, LATICACIDVEN     Note: Above Lab results reviewed.  Imaging  DG Hand Complete Right CLINICAL DATA:  Right third digit pain.  No known injury.  EXAM: RIGHT HAND - COMPLETE 3+ VIEW  COMPARISON:  None.  FINDINGS: No fracture or dislocation. Mild degenerative changes of the interphalangeal joints, greatest at the thumb. Soft tissues are unremarkable.  IMPRESSION: No acute abnormality of the right  hand. Mild degenerative changes of the interphalangeal joints.  Electronically Signed   By: Miachel Roux M.D.   On: 03/21/2021 07:37 DG ELBOW COMPLETE RIGHT (3+VIEW) CLINICAL DATA:  Increased right elbow pain.  No known injury.  EXAM: RIGHT ELBOW - COMPLETE 3+ VIEW  COMPARISON:  None.  FINDINGS: No acute fracture or dislocation. Os ossific fragments adjacent to the radial head and coronoid process of the ulna likely degenerative versus sequelae of remote trauma. Mild degenerative spurring of the elbow joint is noted. Soft tissues are unremarkable.  IMPRESSION: Mild degenerative changes of the right elbow.  Electronically Signed   By: Miachel Roux M.D.   On: 03/21/2021 07:36  Assessment  The primary encounter diagnosis was Primary osteoarthritis of left knee. A diagnosis of Chronic pain syndrome was also pertinent to this visit.  Plan of Care   [name] has a current medication list which includes the following long-term medication(s): apixaban, atorvastatin, claritin, metformin, and linaclotide.   History of left knee arthroscopic surgery, left knee replacement in the past with subsequent knee osteoarthritis.  Status post left knee intra-articular steroid injection with limited response.  Discussed genicular nerve block and possible genicular nerve radiofrequency ablation.  Risks and benefits reviewed and patient would like to proceed.  Recommend the patient apply Voltaren gel to his knees, left elbow and hand.  Pharmacotherapy (Medications Ordered): Meds ordered this encounter  Medications  . diclofenac Sodium (VOLTAREN) 1 % GEL    Sig: Apply 4 g topically 4 (four) times daily as needed.    Dispense:  350 g    Refill:  PRN   Orders:  Orders Placed This Encounter  Procedures  . GENICULAR NERVE BLOCK    Indication(s):  Sub-acute knee pain    Standing Status:  Future    Standing Expiration Date:   07/30/2021    Scheduling Instructions:     Side:LEFT      Sedation: Patient's choice.     Timeframe: As soon as schedule allows    Order Specific Question:   Where will this procedure be performed?    Answer:   [facility]   Follow-up plan:   Return in about 3 weeks (around 05/20/2021) for left knee GNB .   Recent Visits Date Type Provider Dept  04/01/21 Procedure visit Gillis Santa, MD [facility]  03/25/21 Procedure visit Gillis Santa, MD [facility]  03/19/21 Telemedicine Gillis Santa, MD [facility]  Showing recent visits within past 90 days and meeting all other requirements Today's Visits Date Type Provider Dept  04/29/21 Telemedicine Gillis Santa, MD [facility]  Showing today's visits and meeting all other requirements Future Sandoval No visits were found meeting these conditions. Showing future Sandoval within next 90 days and meeting all other requirements  I discussed the assessment and treatment plan with the patient. The patient was provided an opportunity to ask questions and all were answered. The patient agreed with the plan and demonstrated an Sandoval of the instructions.  Patient advised to call back or seek an in-person evaluation if the symptoms or condition worsens.  Duration of encounter: 67mnutes.  Note by: BGillis Santa MD Date: 04/29/2021; Time: 3:15 PM

## 2021-04-30 ENCOUNTER — Telehealth: Payer: Self-pay | Admitting: Student in an Organized Health Care Education/Training Program

## 2021-04-30 NOTE — Telephone Encounter (Signed)
Ms Cliffton Asters called asking about a topical medication that was sent in. They got a call from a different pharmacy than where he usually goes and wanted to know if this is the place the script was sent to? Please call

## 2021-05-01 NOTE — Telephone Encounter (Signed)
Called patient and they have already resolved the issue and the diclofenac is ready for pickup .

## 2021-05-20 ENCOUNTER — Ambulatory Visit: Payer: Medicare HMO | Admitting: Student in an Organized Health Care Education/Training Program

## 2021-05-31 ENCOUNTER — Emergency Department
Admission: EM | Admit: 2021-05-31 | Discharge: 2021-05-31 | Disposition: A | Discharge status: Home / Self Care | Payer: Medicare HMO | Attending: Emergency Medicine | Admitting: Emergency Medicine

## 2021-05-31 ENCOUNTER — Other Ambulatory Visit: Payer: Self-pay

## 2021-05-31 DIAGNOSIS — E1140 Type 2 diabetes mellitus with diabetic neuropathy, unspecified: Secondary | ICD-10-CM | POA: Insufficient documentation

## 2021-05-31 DIAGNOSIS — Z96642 Presence of left artificial hip joint: Secondary | ICD-10-CM | POA: Insufficient documentation

## 2021-05-31 DIAGNOSIS — Z7901 Long term (current) use of anticoagulants: Secondary | ICD-10-CM | POA: Insufficient documentation

## 2021-05-31 DIAGNOSIS — E1165 Type 2 diabetes mellitus with hyperglycemia: Secondary | ICD-10-CM | POA: Diagnosis not present

## 2021-05-31 DIAGNOSIS — Z96652 Presence of left artificial knee joint: Secondary | ICD-10-CM | POA: Diagnosis not present

## 2021-05-31 DIAGNOSIS — Z7984 Long term (current) use of oral hypoglycemic drugs: Secondary | ICD-10-CM | POA: Insufficient documentation

## 2021-05-31 DIAGNOSIS — Z79899 Other long term (current) drug therapy: Secondary | ICD-10-CM | POA: Insufficient documentation

## 2021-05-31 DIAGNOSIS — R739 Hyperglycemia, unspecified: Secondary | ICD-10-CM | POA: Diagnosis present

## 2021-05-31 LAB — CBC
HCT: 49.1 % (ref 39.0–52.0)
Hemoglobin: 16.8 g/dL (ref 13.0–17.0)
MCH: 31.2 pg (ref 26.0–34.0)
MCHC: 34.2 g/dL (ref 30.0–36.0)
MCV: 91.3 fL (ref 80.0–100.0)
Platelets: 212 10*3/uL (ref 150–400)
RBC: 5.38 MIL/uL (ref 4.22–5.81)
RDW: 11.9 % (ref 11.5–15.5)
WBC: 10.1 10*3/uL (ref 4.0–10.5)
nRBC: 0 % (ref 0.0–0.2)

## 2021-05-31 LAB — BASIC METABOLIC PANEL WITH GFR
Anion gap: 6 (ref 5–15)
BUN: 25 mg/dL — ABNORMAL HIGH (ref 8–23)
CO2: 28 mmol/L (ref 22–32)
Calcium: 9.7 mg/dL (ref 8.9–10.3)
Chloride: 101 mmol/L (ref 98–111)
Creatinine, Ser: 1 mg/dL (ref 0.61–1.24)
GFR, Estimated: >60 mL/min
Glucose, Bld: 313 mg/dL — ABNORMAL HIGH (ref 70–99)
Potassium: 4.2 mmol/L (ref 3.5–5.1)
Sodium: 135 mmol/L (ref 135–145)

## 2021-05-31 LAB — URINALYSIS, COMPLETE (UACMP) WITH MICROSCOPIC
Bilirubin Urine: NEGATIVE
Glucose, UA: >=500 mg/dL — AB
Ketones, ur: NEGATIVE mg/dL
Leukocytes,Ua: NEGATIVE
Nitrite: NEGATIVE
Protein, ur: NEGATIVE mg/dL
Specific Gravity, Urine: 1.031 — ABNORMAL HIGH (ref 1.005–1.030)
Squamous Epithelial / HPF: NONE SEEN (ref 0–5)
pH: 5 (ref 5.0–8.0)

## 2021-05-31 LAB — CBG MONITORING, ED
Glucose-Capillary: 302 mg/dL — ABNORMAL HIGH (ref 70–99)
Glucose-Capillary: 240 mg/dL — ABNORMAL HIGH (ref 70–99)
Glucose-Capillary: 228 mg/dL — ABNORMAL HIGH (ref 70–99)

## 2021-05-31 MED ORDER — SODIUM CHLORIDE 0.9 % IV BOLUS
1000.0000 mL | Freq: Once | INTRAVENOUS | Status: AC
  Administered 2021-05-31: 1000 mL via INTRAVENOUS

## 2021-05-31 NOTE — ED Provider Notes (Signed)
[facility] Emergency Department Provider Note ____________________________________________   Event Date/Time   First MD Initiated Contact with Patient 05/31/21 1442     (approximate)  I have reviewed the triage vital signs and the nursing notes.   HISTORY  Chief Complaint  Hyperglycemia  HPI Harold Sandoval is a 80 y.o. male with history of diabetes presents to the emergency department for treatment and evaluation elevated glucose. He has been feeling fatigued for the past couple days. He states that he worked outside for [employer] and came in to rest but did not recover as quickly as usual and decided to check his blood sugar. It was in the 260s which is unusual. He states he has been taking his medications as prescribed and hasn't missed any doses.     Past Medical History:  Diagnosis Date   Arthritis    "all over" (02/10/2018)   Balance problem    when first stands.  Since stroke.   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    Macular degeneration, left eye    Sinus headache    resolved   Type II diabetes mellitus [facility])     Patient Active Problem List   Diagnosis Date Noted   Right hand pain 03/19/2021   Right elbow pain 03/19/2021   Spinal stenosis, lumbar region, 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   Thoracic facet joint syndrome 02/27/2020   Chronic pain syndrome 02/27/2020   TIA (transient ischemic attack) 12/19/2018   Acute CVA (cerebrovascular accident) (HCC) 09/06/2018   Primary osteoarthritis of left knee  08/11/2018   Degenerative arthritis of left knee 08/10/2018   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   Osteoarthritis of left hip 02/09/2018   Constipation    Change in bowel habits    Thoracic radiculopathy 05/12/2016   Strain of rectus abdominis muscle 08/01/2015    Past Surgical History:  Procedure Laterality Date   APPENDECTOMY     BACK SURGERY     CATARACT EXTRACTION, BILATERAL     COLONOSCOPY WITH PROPOFOL N/A 12/29/2016   Procedure: COLONOSCOPY WITH PROPOFOL;  Surgeon: Midge Minium, MD;  Location: [facility];  Service: Endoscopy;  Laterality: N/A;  Diabetic - oral meds   JOINT REPLACEMENT     LAPAROSCOPIC CHOLECYSTECTOMY     LOOP RECORDER INSERTION N/A 02/24/2017   Procedure: Loop Recorder Insertion;  Surgeon: Marcina Millard, MD;  Location: [facility] INVASIVE CV LAB;  Service: Cardiovascular;  Laterality: N/A;   LUMBAR DISC SURGERY     TEE WITHOUT CARDIOVERSION N/A 12/21/2018   Procedure: TRANSESOPHAGEAL ECHOCARDIOGRAM (TEE);  Surgeon: Dalia Heading, MD;  Location: [facility] ORS;  Service: Cardiovascular;  Laterality: N/A;   TOTAL HIP ARTHROPLASTY Left 02/10/2018   Procedure: TOTAL HIP ARTHROPLASTY ANTERIOR APPROACH;  Surgeon: Gean Birchwood, MD;  Location: [facility] OR;  Service: Orthopedics;  Laterality: Left;   TOTAL KNEE ARTHROPLASTY Left 08/11/2018   Procedure: LEFT TOTAL KNEE ARTHROPLASTY;  Surgeon: Gean Birchwood, MD;  Location: [facility];  Service: Orthopedics;  Laterality: Left;  Prior to Admission medications   Medication Sig Start Date End Date Taking? Authorizing Provider  apixaban (ELIQUIS) 5 MG TABS tablet Take 1 tablet (5 mg total) by mouth 2 (two) times daily. 09/07/18   Enid Baas, MD  atorvastatin (LIPITOR) 80 MG tablet Take 80 mg by mouth every evening.     [provider]  brimonidine (ALPHAGAN) 0.2 % ophthalmic solution Place 1 drop into both eyes 2 (two) times daily.     [provider]  brinzolamide (AZOPT) 1 % ophthalmic suspension  Place 1 drop into both eyes 2 (two) times daily.    [provider]  Cholecalciferol (VITAMIN D3) 2000 units capsule Take 2,000 Units by mouth daily.    [provider]  CLARITIN 10 MG tablet Take 10 mg by mouth daily. 09/16/19   [provider]  desonide (DESOWEN) 0.05 % lotion Apply 1 application topically 2 (two) times daily.    [provider]  diclofenac Sodium (VOLTAREN) 1 % GEL Apply 4 g topically 4 (four) times daily as needed. 04/29/21 10/26/21  Edward Jolly, MD  fluticasone (FLONASE) 50 MCG/ACT nasal spray Place 2 sprays into both nostrils daily as needed for rhinitis.    [provider]  gabapentin (NEURONTIN) 300 MG capsule Take 300 mg by mouth 3 (three) times daily. 12/17/19   [provider]  latanoprost (XALATAN) 0.005 % ophthalmic solution Place 1 drop into both eyes at bedtime.     [provider]  linaclotide (LINZESS) 290 MCG CAPS capsule Take 290 mcg by mouth daily. 10/18/18 02/26/19  [provider]  metFORMIN (GLUCOPHAGE) 500 MG tablet Take 500 mg by mouth 2 (two) times daily with a meal. 08/03/18   [provider]    Allergies Lactose intolerance (gi)  Family History  Problem Relation Age of Onset   Cancer Mother    Cancer Father     Social History Social History   Tobacco Use   Smoking status: Never   Smokeless tobacco: Never  Vaping Use   Vaping Use: Never used  Substance Use Topics   Alcohol use: No    Alcohol/week: 0.0 standard drinks   Drug use: No    Review of Systems  Constitutional: No fever/chills Eyes: No visual changes. ENT: No sore throat. Cardiovascular: Denies chest pain. Respiratory: Denies shortness of breath. Gastrointestinal: No abdominal pain.  No nausea, no vomiting.  No diarrhea.  No constipation. Genitourinary: Negative for dysuria. Musculoskeletal: Negative for back pain. Skin: Negative for rash. Neurological: Negative for headaches, focal weakness or  numbness. ____________________________________________   PHYSICAL EXAM:  VITAL SIGNS: ED Triage Vitals  Enc Vitals Group     BP 05/31/21 1331 (!) 143/85     Pulse Rate 05/31/21 1331 98     Resp 05/31/21 1331 16     Temp 05/31/21 1331 98.6 F (37 C)     Temp Source 05/31/21 1331 Oral     SpO2 05/31/21 1331 97 %     Weight 05/31/21 1332 213 lb (96.6 kg)     Height 05/31/21 1332 6\' 4"  (1.93 m)     Head Circumference --      Peak Flow --      Pain Score 05/31/21 1332 0     Pain Loc --      Pain Edu? --      Excl. in GC? --     Constitutional: Alert and oriented. Well appearing and in no acute distress. Eyes: Conjunctivae are normal. Head: Atraumatic.  Nose: No congestion/rhinnorhea. Mouth/Throat: Mucous membranes are moist.  Oropharynx non-erythematous. Neck: No stridor.   Hematological/Lymphatic/Immunilogical: No cervical lymphadenopathy. Cardiovascular: Normal rate, regular rhythm. Grossly normal heart sounds.  Good peripheral circulation. Respiratory: Normal respiratory effort.  No retractions. Lungs CTAB. Gastrointestinal: Soft and nontender. No distention. No abdominal bruits. No CVA tenderness. Genitourinary:  Musculoskeletal: No lower extremity tenderness nor edema.  No joint effusions. Neurologic:  Normal speech and language. No gross focal neurologic deficits are appreciated. No gait instability. Skin:  Skin is warm, dry and intact. No rash noted. Psychiatric: Mood and affect are normal. Speech and behavior are normal.  ____________________________________________   LABS (all labs ordered are listed, but only abnormal results are displayed)  Labs Reviewed  BASIC METABOLIC PANEL - Abnormal; Notable for the following components:      Result Value   Glucose, Bld 313 (*)    BUN 25 (*)    All other components within normal limits  URINALYSIS, COMPLETE (UACMP) WITH MICROSCOPIC - Abnormal; Notable for the following components:   Color, Urine YELLOW (*)     APPearance CLEAR (*)    Specific Gravity, Urine 1.031 (*)    Glucose, UA >=500 (*)    Hgb urine dipstick SMALL (*)    Bacteria, UA RARE (*)    All other components within normal limits  CBG MONITORING, ED - Abnormal; Notable for the following components:   Glucose-Capillary 302 (*)    All other components within normal limits  CBG MONITORING, ED - Abnormal; Notable for the following components:   Glucose-Capillary 240 (*)    All other components within normal limits  CBG MONITORING, ED - Abnormal; Notable for the following components:   Glucose-Capillary 228 (*)    All other components within normal limits  CBC  CBG MONITORING, ED   ____________________________________________  EKG  Not indicated. ____________________________________________  RADIOLOGY  ED MD interpretation:    Not indicated.  I, Kem Boroughsari Rogelio Waynick, personally viewed and evaluated these images (plain radiographs) as part of my medical decision making, as well as reviewing the written report by the radiologist.  Official radiology report(s): No results found.  ____________________________________________   PROCEDURES  Procedure(s) performed (including Critical Care):  Procedures  ____________________________________________   INITIAL IMPRESSION / ASSESSMENT AND PLAN     80 year old male presenting to the emergency department for treatment and evaluation of blood sugar over 300 today on his monitor.  He states that he has just been feeling fatigued for no specific reason. See HPI. Will get some labs and give fluids.  DIFFERENTIAL DIAGNOSIS  Hyperglycemia, DKA  ED COURSE  Patient received a liter of IV fluids which helped decrease his glucose to 240.  He is not in DKA.  His exam is overall reassuring.  He states that he feels some better after having had fluids and a reduction in his glucose.  He was advised to monitor his blood glucose at home, stay well-hydrated, and adhere strictly to a diabetic  diet.  He was encouraged to follow-up with his primary care provider as well.  He is to return to the emergency department for symptoms change or worsen if he is unable to schedule an appointment..    ___________________________________________   FINAL CLINICAL IMPRESSION(S) / ED DIAGNOSES  Final diagnoses:  Hyperglycemia     ED Discharge Orders     None        Harold Sandoval was evaluated in Emergency Department on 05/31/2021 for the symptoms described in the history of present  illness. He was evaluated in the context of the global COVID-19 pandemic, which necessitated consideration that the patient might be at risk for infection with the SARS-CoV-2 virus that causes COVID-19. Institutional protocols and algorithms that pertain to the evaluation of patients at risk for COVID-19 are in a state of rapid change based on information released by regulatory bodies including the CDC and federal and state organizations. These policies and algorithms were followed during the patient's care in the ED.   Note:  This document was prepared using Dragon voice recognition software and may include unintentional dictation errors.    Chinita Pester, FNP 05/31/21 Mancel Parsons, MD 05/31/21 2222

## 2021-05-31 NOTE — ED Notes (Signed)
EDP at bedside  

## 2021-05-31 NOTE — ED Triage Notes (Signed)
Pt arrives via POV with C/O high glucose readings at home [phone]). Hx of T2D. Pt denies changes in vision, confusion. Pt VS WNL and appears in NAD at this time.

## 2021-05-31 NOTE — ED Notes (Signed)
Pt ambulated to toilet to provide urine specimen with no difficulties and then back to bed.

## 2021-05-31 NOTE — Discharge Instructions (Addendum)
Please increase your intake of water and decrease the number of carbohydrates you are eating/drinking.  Follow up with your primary care provider.  Return to the ER for symptoms that change or worsen or for new concerns if unable to schedule an appointment.

## 2021-07-25 IMAGING — MR MR LUMBAR SPINE W/O CM
5 series · 38 of 48 positions shown · non-contrast
Comparison: Lumbar radiographs 11/26/2007.

CLINICAL DATA: 78-year-old male with low back pain radiating down
both legs greater on the left. Recent left lower extremity surgery.
Fell backwards 4 months ago. Remote lumbar surgery 30 years ago.

EXAM:
MRI LUMBAR SPINE WITHOUT CONTRAST
TECHNIQUE: Multiplanar, multisequence MR imaging of the lumbar spine was
performed. No intravenous contrast was administered.

[Series 6: t2_tse_warp_sag · sagittal · 4.0mm · 0.94mm/px · 6 of 17 slices shown]
[im 1/17]
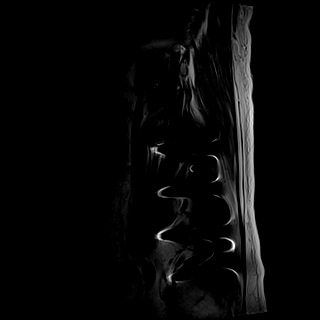
[im 4/17]
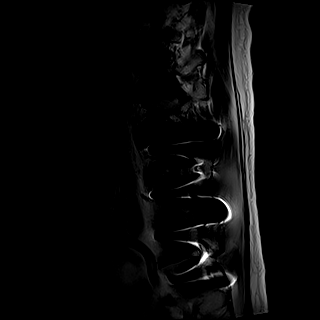
[im 7/17]
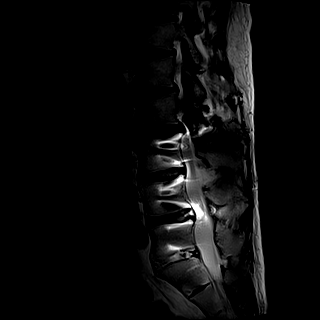
[im 10/17]
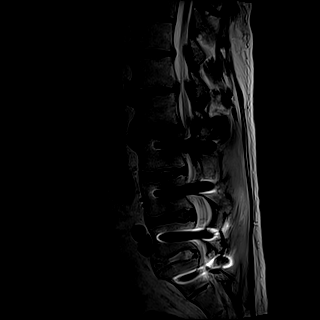
[im 13/17]
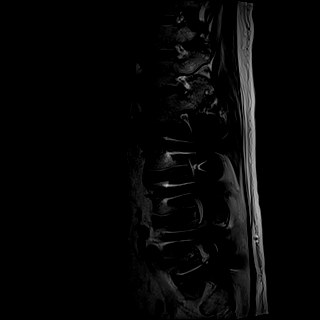
[im 17/17]
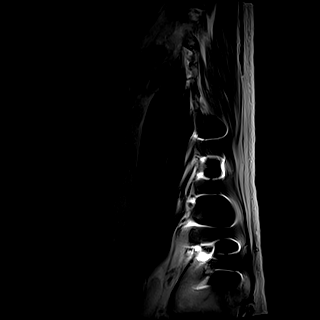

[Series 7: t1_tse_warp_sag · sagittal · 4.0mm · 0.94mm/px · 5 of 17 slices shown]
[im 1/17]
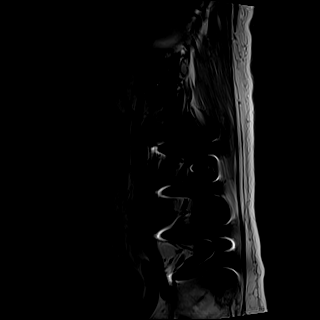
[im 5/17]
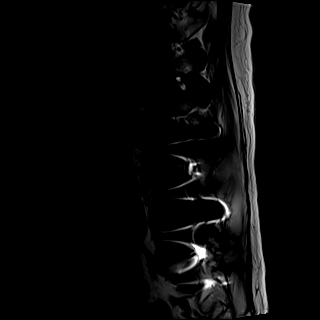
[im 9/17]
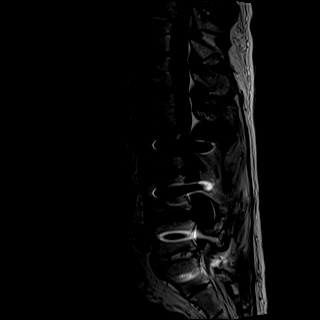
[im 13/17]
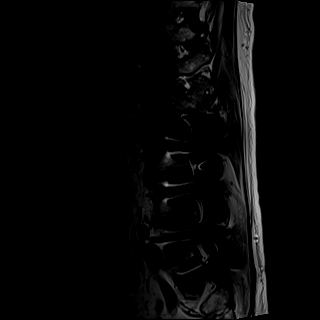
[im 17/17]
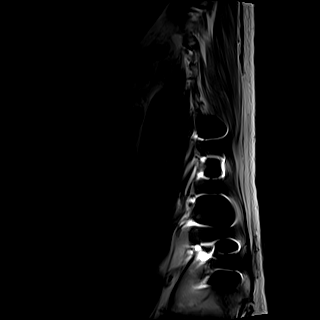

[Series 8: t2_tse_stir_warp_sag · sagittal · 4.0mm · 1.17mm/px · 5 of 17 slices shown]
[im 1/17]
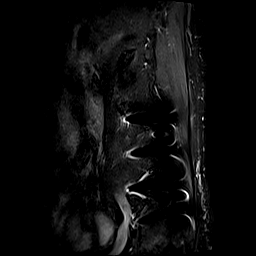
[im 5/17]
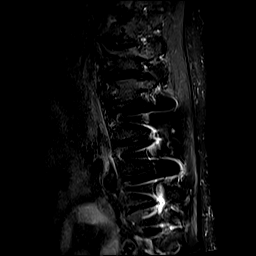
[im 9/17]
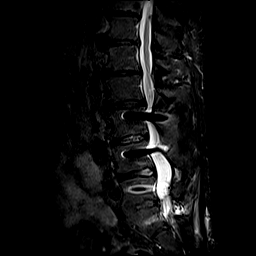
[im 13/17]
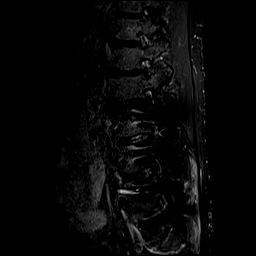
[im 17/17]
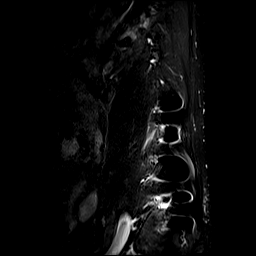

[Series 9: t2_tse_warp_tra · axial · 4.0mm · 0.78mm/px · z∈[-139,+76]mm · 14 of 50 slices shown]
[im 1/50]
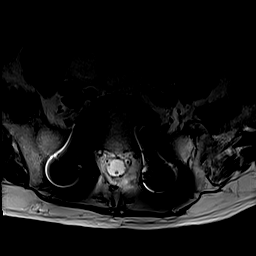
[im 4/50]
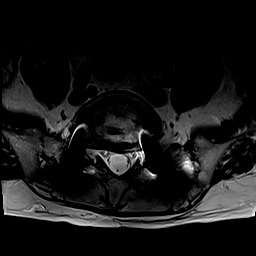
[im 7/50]
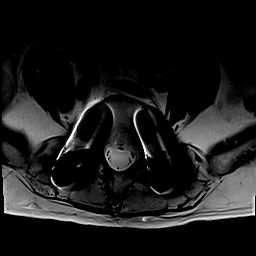
[im 10/50]
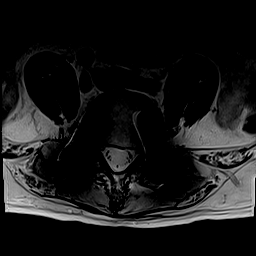
[im 14/50]
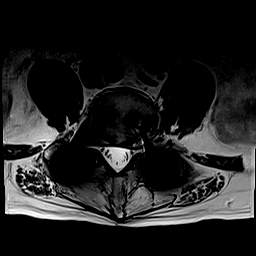
[im 17/50]
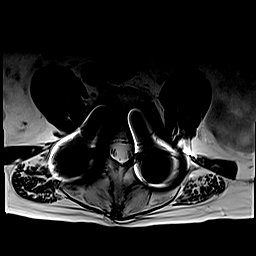
[im 20/50]
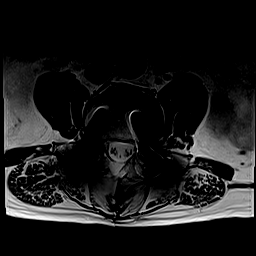
[im 23/50]
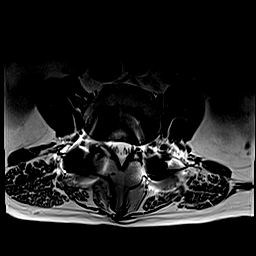
[im 27/50]
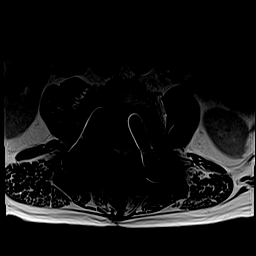
[im 30/50]
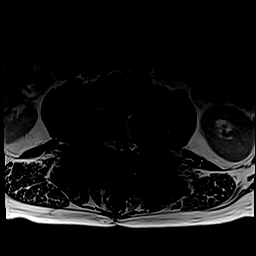
[im 33/50]
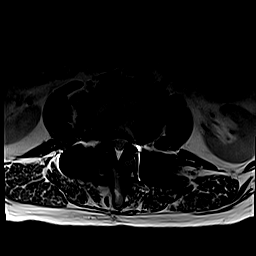
[im 36/50]
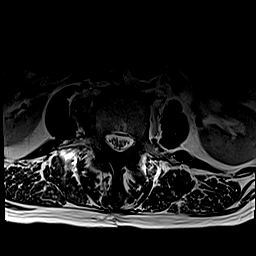
[im 43/50]
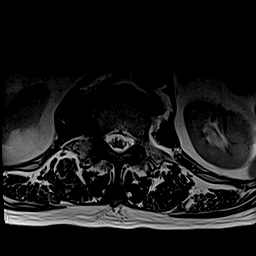
[im 50/50]
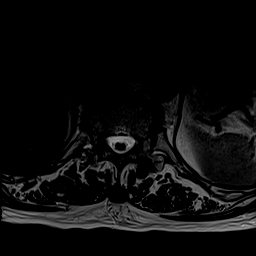

[Series 10: t1_tse_warp_tra · axial · 4.0mm · 0.39mm/px · z∈[-125,+76]mm · 8 of 50 slices shown]
[im 4/50]
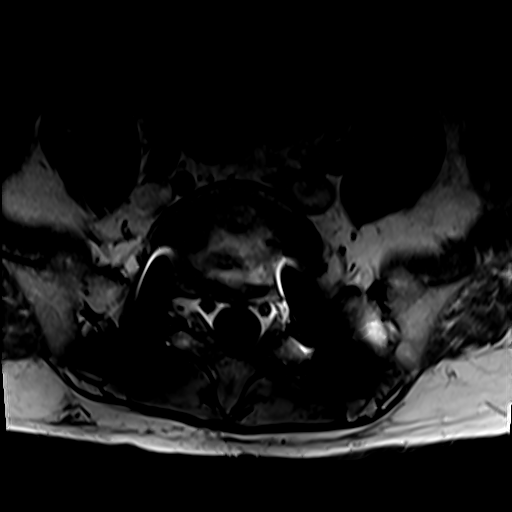
[im 10/50]
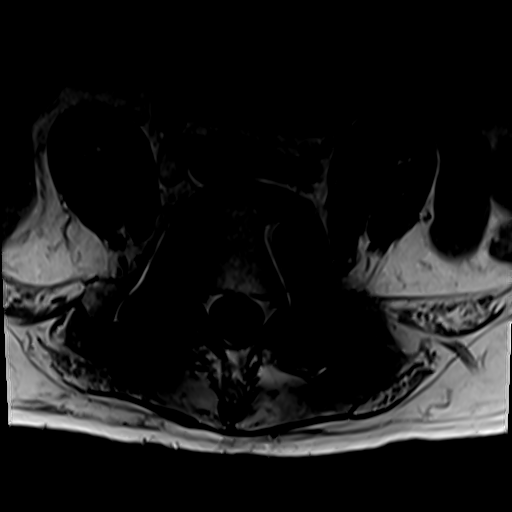
[im 17/50]
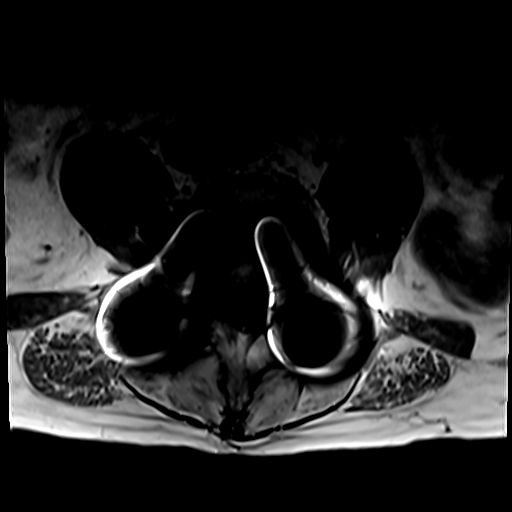
[im 23/50]
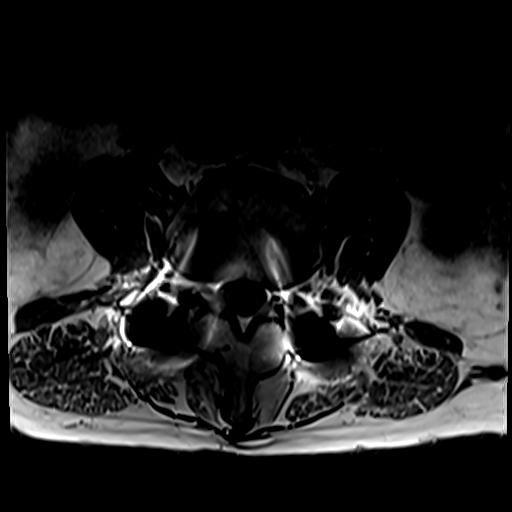
[im 30/50]
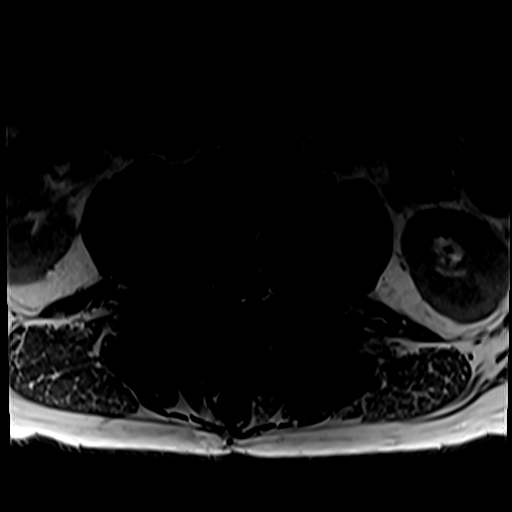
[im 36/50]
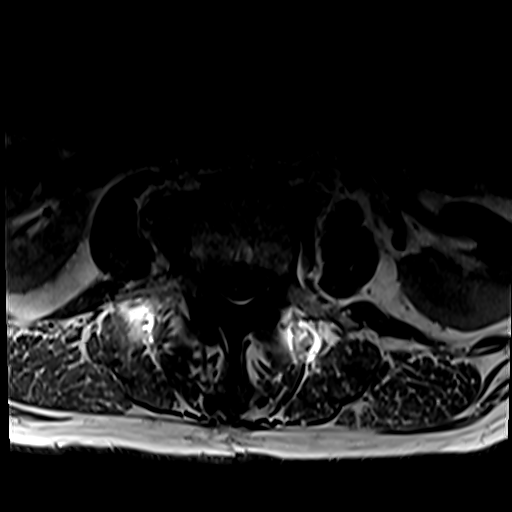
[im 43/50]
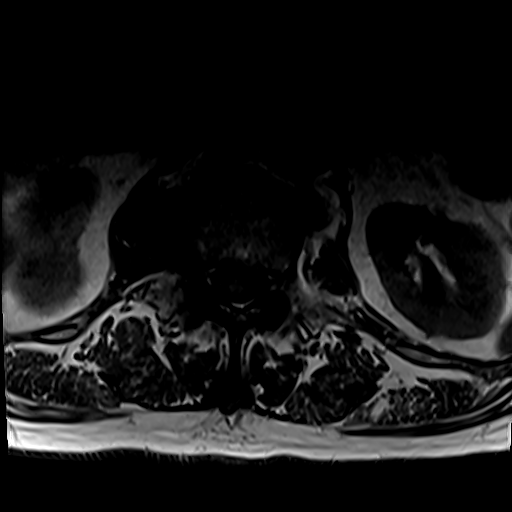
[im 50/50]
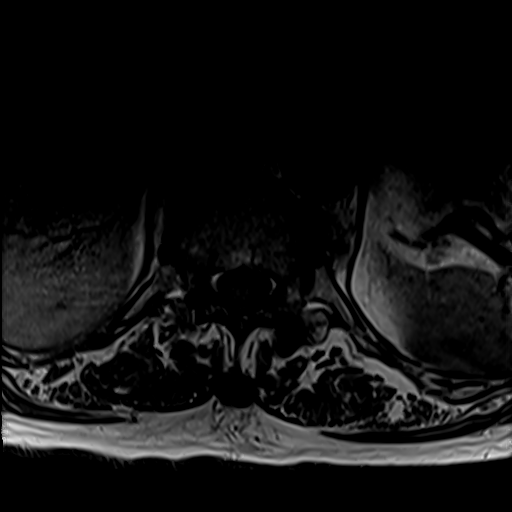

[38 of 48 positions shown; findings below may reference images not displayed]

FINDINGS: Segmentation:  Normal on the comparison.

Alignment:  Stable chronic straightening of lumbar lordosis.

Vertebrae: Hardware susceptibility artifact L3 to the sacrum. No
marrow edema or evidence of acute osseous abnormality. Background
bone marrow signal within normal limits. Intact visible sacrum and
SI joints.

Conus medullaris and cauda equina: Conus extends to the L1 level. No
lower spinal cord or conus signal abnormality.

Paraspinal and other soft tissues: Chronic postoperative changes to
the lower lumbar paraspinal soft tissues. Negative visible abdominal
viscera; benign bilateral renal cysts are visible.

Disc levels:

T11-T12: Anterior endplate spurring. Moderate to severe facet
hypertrophy greater on the left. Moderate left T11 foraminal
stenosis.

T12-L1: Lobulated circumferential disc osteophyte complex with
moderate facet and ligament flavum hypertrophy. No significant
spinal or foraminal stenosis.

L1-L2: Disc space loss with circumferential disc osteophyte complex.
Moderate posterior element hypertrophy. Mild to moderate spinal
stenosis (series 9, image 11). Moderate bilateral L1 foraminal
stenosis.

L2-L3: Bulky circumferential disc osteophyte complex and posterior
element hypertrophy. Moderate to severe spinal stenosis (series 9,
image 20). Moderate bilateral L2 foraminal stenosis.

L3-L4: Prior decompression and fusion with evidence of solid
arthrodesis.

L4-L5: Prior decompression and fusion with evidence of solid
arthrodesis.

L5-S1: Prior decompression and fusion with evidence of solid
arthrodesis.
IMPRESSION: 1. Prior decompression and fusion from L3 to the sacrum with no
adverse features.
2. Adjacent segment disease at L2-L3 with moderate to severe
multifactorial spinal and moderate biforaminal stenosis.
3. Mild to moderate spinal stenosis at L1-L2 with moderate foraminal
stenosis.
4. Lower thoracic facet hypertrophy with moderate left T11 foraminal
stenosis.

## 2021-08-01 NOTE — Telephone Encounter (Signed)
error 

## 2021-08-07 ENCOUNTER — Ambulatory Visit: Payer: Medicare HMO | Admitting: Dermatology

## 2021-08-07 ENCOUNTER — Other Ambulatory Visit: Payer: Self-pay

## 2021-08-07 DIAGNOSIS — S20461S Insect bite (nonvenomous) of right back wall of thorax, sequela: Secondary | ICD-10-CM

## 2021-08-07 DIAGNOSIS — L910 Hypertrophic scar: Secondary | ICD-10-CM | POA: Diagnosis not present

## 2021-08-07 DIAGNOSIS — W57XXXS Bitten or stung by nonvenomous insect and other nonvenomous arthropods, sequela: Secondary | ICD-10-CM | POA: Diagnosis not present

## 2021-08-07 MED ORDER — MOMETASONE FUROATE 0.1 % EX CREA: TOPICAL_CREAM | CUTANEOUS | 2 refills | Status: DC

## 2021-08-07 NOTE — Patient Instructions (Addendum)
Hypertrophic Scar vs Keloid - (top lesion) IL injection today.(steroid, Kenalog 10mg /mL) Hypertrophic Scar with possible underlying cyst - (lower lesion) - IL injection   If you have any questions or concerns for your doctor, please call our main line at [phone] and press option 4 to reach your doctor's medical assistant. If no one answers, please leave a voicemail as directed and we will return your call as soon as possible. Messages left after 4 pm will be answered the following business day.   You may also send [phone] a message via MyChart. We typically respond to MyChart messages within 1-2 business days.  For prescription refills, please ask your pharmacy to contact our office. Our fax number is [phone].  If you have an urgent issue when the clinic is closed that cannot wait until the next business day, you can page your doctor at the number below.    Please note that while we do our best to be available for urgent issues outside of office hours, we are not available 24/7.   If you have an urgent issue and are unable to reach [phone], you may choose to seek medical care at your doctor's office, retail clinic, urgent care center, or emergency room.  If you have a medical emergency, please immediately call 911 or go to the emergency department.  Pager Numbers  - Dr. [place]: [phone]  - Dr. [phone]: [phone]  - Dr. [phone]: [phone]  In the event of inclement weather, please call our main line at [phone] for an update on the status of any delays or closures.  Dermatology Medication Tips: Please keep the boxes that topical medications come in in order to help keep track of the instructions about where and how to use these. Pharmacies typically print the medication instructions only on the boxes and not directly on the medication tubes.   If your medication is too expensive, please contact our office at [phone] option 4 or send [phone] a message through MyChart.   We  are unable to tell what your co-pay for medications will be in advance as this is different depending on your insurance coverage. However, we may be able to find a substitute medication at lower cost or fill out paperwork to get insurance to cover a needed medication.   If a prior authorization is required to get your medication covered by your insurance company, please allow [place] 1-2 business days to complete this process.  Drug prices often vary depending on where the prescription is filled and some pharmacies may offer cheaper prices.  The website www.goodrx.com contains coupons for medications through different pharmacies. The prices here do not account for what the cost may be with help from insurance (it may be cheaper with your insurance), but the website can give you the price if you did not use any insurance.  - You can print the associated coupon and take it with your prescription to the pharmacy.  - You may also stop by our office during regular business hours and pick up a GoodRx coupon card.  - If you need your prescription sent electronically to a different pharmacy, notify our office through [facility] or by phone at [phone] option 4.

## 2021-08-07 NOTE — Progress Notes (Signed)
   Follow-Up Visit   Subjective  Harold Sandoval is a 80 y.o. male who presents for the following: Bump (Chest x 1 year. No itch or pain. ) and Hx possible bite reaction (Biopsy proven in the past. Patient still has itchy spots off and on and requests refill of mometasone cream. ).  Patient here with significant other today.  The following portions of the chart were reviewed this encounter and updated as appropriate:       Review of Systems:  No other skin or systemic complaints except as noted in HPI or Assessment and Plan.  Objective  Well appearing patient in no apparent distress; mood and affect are within normal limits.  A focused examination was performed including chest. Relevant physical exam findings are noted in the Assessment and Plan.  upper sternum Firm brown nodule 8 mm  lower sternum 6.47mm hyperpigmented firm papule with adjacent punctum   Assessment & Plan  Keloid upper sternum  Benign, observe.   Discussed ILK injections to soften spot, pt wishes to proceed  Intralesional injection - upper sternum Location: upper sternum  Informed Consent: Discussed risks (infection, pain, bleeding, bruising, thinning of the skin, loss of skin pigment, lack of resolution, and recurrence of lesion) and benefits of the procedure, as well as the alternatives. Informed consent was obtained. Preparation: The area was prepared a standard fashion.  Procedure Details: An intralesional injection was performed with Kenalog 10 mg/cc. 0.2 cc in total were injected.  Total number of injections: <7  Plan: The patient was instructed on post-op care. Recommend OTC analgesia as needed for pain.   Hypertrophic scar lower sternum  With possible underlying cyst  Discussed ILK injections. Do not recommend excision due to possible keloid formation. Injection performed today  Benign, observe.     Intralesional injection - lower sternum Location: sternum  Informed Consent:  Discussed risks (infection, pain, bleeding, bruising, thinning of the skin, loss of skin pigment, lack of resolution, and recurrence of lesion) and benefits of the procedure, as well as the alternatives. Informed consent was obtained. Preparation: The area was prepared a standard fashion.  Procedure Details: An intralesional injection was performed with Kenalog 10 mg/cc. 0.2 cc in total were injected.  Total number of injections: <7  Plan: The patient was instructed on post-op care. Recommend OTC analgesia as needed for pain.   Bug bite without infection, sequela Right Upper Back  History of Bug Bite Reaction, currently clear.  Continue mometasone cream Apply qd/bid to itchy areas prn dsp 45g 2Rf. Avoid face, groin, axilla.   Topical steroids (such as triamcinolone, fluocinolone, fluocinonide, mometasone, clobetasol, halobetasol, betamethasone, hydrocortisone) can cause thinning and lightening of the skin if they are used for too long in the same area. Your physician has selected the right strength medicine for your problem and area affected on the body. Please use your medication only as directed by your physician to prevent side effects.    mometasone (ELOCON) 0.1 % cream - Right Upper Back Apply 1-2 times a day to itchy spots on body until improved. Avoid face, groin, axilla.  Return if symptoms worsen or fail to improve.  ICherlyn Labella, CMA, am acting as scribe for Willeen Niece, MD .  Documentation: I have reviewed the above documentation for accuracy and completeness, and I agree with the above.  Willeen Niece MD

## 2021-12-17 ENCOUNTER — Other Ambulatory Visit: Payer: Self-pay | Admitting: Internal Medicine

## 2021-12-17 DIAGNOSIS — R1013 Epigastric pain: Secondary | ICD-10-CM

## 2021-12-25 ENCOUNTER — Other Ambulatory Visit: Payer: Self-pay

## 2021-12-25 ENCOUNTER — Ambulatory Visit
Admission: RE | Admit: 2021-12-25 | Discharge: 2021-12-25 | Disposition: A | Discharge status: Home / Self Care | Payer: Medicare HMO | Source: Ambulatory Visit | Attending: Internal Medicine | Admitting: Internal Medicine

## 2021-12-25 DIAGNOSIS — R1013 Epigastric pain: Secondary | ICD-10-CM | POA: Diagnosis present

## 2021-12-25 MED ORDER — IOHEXOL 300 MG/ML  SOLN
100.0000 mL | Freq: Once | INTRAMUSCULAR | Status: AC | PRN
  Administered 2021-12-25: 100 mL via INTRAVENOUS

## 2022-01-03 ENCOUNTER — Other Ambulatory Visit: Payer: Medicare HMO

## 2022-01-17 ENCOUNTER — Emergency Department: Payer: Medicare HMO

## 2022-01-17 ENCOUNTER — Encounter: Payer: Self-pay | Admitting: Emergency Medicine

## 2022-01-17 ENCOUNTER — Emergency Department (HOSPITAL_COMMUNITY): Payer: Medicare HMO

## 2022-01-17 ENCOUNTER — Emergency Department
Admission: EM | Admit: 2022-01-17 | Discharge: 2022-01-17 | Disposition: A | Discharge status: Nursing Home (Includes ICF) | Payer: Medicare HMO | Attending: Emergency Medicine | Admitting: Emergency Medicine

## 2022-01-17 ENCOUNTER — Other Ambulatory Visit: Payer: Self-pay

## 2022-01-17 DIAGNOSIS — S20219A Contusion of unspecified front wall of thorax, initial encounter: Secondary | ICD-10-CM | POA: Insufficient documentation

## 2022-01-17 DIAGNOSIS — R109 Unspecified abdominal pain: Secondary | ICD-10-CM | POA: Diagnosis not present

## 2022-01-17 DIAGNOSIS — S0003XA Contusion of scalp, initial encounter: Secondary | ICD-10-CM | POA: Diagnosis not present

## 2022-01-17 DIAGNOSIS — H1131 Conjunctival hemorrhage, right eye: Secondary | ICD-10-CM | POA: Diagnosis not present

## 2022-01-17 DIAGNOSIS — S2020XA Contusion of thorax, unspecified, initial encounter: Secondary | ICD-10-CM

## 2022-01-17 DIAGNOSIS — M545 Low back pain, unspecified: Secondary | ICD-10-CM | POA: Diagnosis not present

## 2022-01-17 DIAGNOSIS — S199XXA Unspecified injury of neck, initial encounter: Secondary | ICD-10-CM | POA: Diagnosis present

## 2022-01-17 DIAGNOSIS — S0083XA Contusion of other part of head, initial encounter: Secondary | ICD-10-CM

## 2022-01-17 DIAGNOSIS — S161XXA Strain of muscle, fascia and tendon at neck level, initial encounter: Secondary | ICD-10-CM | POA: Insufficient documentation

## 2022-01-17 DIAGNOSIS — Y92410 Unspecified street and highway as the place of occurrence of the external cause: Secondary | ICD-10-CM | POA: Diagnosis not present

## 2022-01-17 DIAGNOSIS — R799 Abnormal finding of blood chemistry, unspecified: Secondary | ICD-10-CM | POA: Diagnosis not present

## 2022-01-17 LAB — CBC WITH DIFFERENTIAL/PLATELET
Abs Immature Granulocytes: 0.05 10*3/uL (ref 0.00–0.07)
Basophils Absolute: 0.1 10*3/uL (ref 0.0–0.1)
Basophils Relative: 1 %
Eosinophils Absolute: 0 10*3/uL (ref 0.0–0.5)
Eosinophils Relative: 0 %
HCT: 44.3 % (ref 39.0–52.0)
Hemoglobin: 14.5 g/dL (ref 13.0–17.0)
Immature Granulocytes: 0 %
Lymphocytes Relative: 24 %
Lymphs Abs: 2.8 10*3/uL (ref 0.7–4.0)
MCH: 30.2 pg (ref 26.0–34.0)
MCHC: 32.7 g/dL (ref 30.0–36.0)
MCV: 92.3 fL (ref 80.0–100.0)
Monocytes Absolute: 0.5 10*3/uL (ref 0.1–1.0)
Monocytes Relative: 4 %
Neutro Abs: 8.3 10*3/uL — ABNORMAL HIGH (ref 1.7–7.7)
Neutrophils Relative %: 71 %
Platelets: 197 10*3/uL (ref 150–400)
RBC: 4.8 MIL/uL (ref 4.22–5.81)
RDW: 12.3 % (ref 11.5–15.5)
WBC: 11.8 10*3/uL — ABNORMAL HIGH (ref 4.0–10.5)
nRBC: 0 % (ref 0.0–0.2)

## 2022-01-17 LAB — TYPE AND SCREEN
ABO/RH(D): B POS
Antibody Screen: NEGATIVE
Unit division: 0
Unit division: 0

## 2022-01-17 LAB — PROTIME-INR
INR: 1.2 (ref 0.8–1.2)
Prothrombin Time: 14.8 s (ref 11.4–15.2)

## 2022-01-17 LAB — BPAM RBC
Blood Product Expiration Date: 202302282359
Blood Product Expiration Date: 202303142359
Unit Type and Rh: 5100
Unit Type and Rh: 7300

## 2022-01-17 LAB — COMPREHENSIVE METABOLIC PANEL WITH GFR
ALT: 17 U/L (ref 0–44)
AST: 23 U/L (ref 15–41)
Albumin: 4 g/dL (ref 3.5–5.0)
Alkaline Phosphatase: 100 U/L (ref 38–126)
Anion gap: 7 (ref 5–15)
BUN: 16 mg/dL (ref 8–23)
CO2: 28 mmol/L (ref 22–32)
Calcium: 9.1 mg/dL (ref 8.9–10.3)
Chloride: 103 mmol/L (ref 98–111)
Creatinine, Ser: 0.79 mg/dL (ref 0.61–1.24)
GFR, Estimated: >60 mL/min
Glucose, Bld: 229 mg/dL — ABNORMAL HIGH (ref 70–99)
Potassium: 3.8 mmol/L (ref 3.5–5.1)
Sodium: 138 mmol/L (ref 135–145)
Total Bilirubin: 1.2 mg/dL (ref 0.3–1.2)
Total Protein: 6.8 g/dL (ref 6.5–8.1)

## 2022-01-17 LAB — CBG MONITORING, ED: Glucose-Capillary: 146 mg/dL — ABNORMAL HIGH (ref 70–99)

## 2022-01-17 LAB — APTT: aPTT: 32 s (ref 24–36)

## 2022-01-17 MED ORDER — ONDANSETRON HCL 4 MG/2ML IJ SOLN
INTRAMUSCULAR | Status: AC
  Administered 2022-01-17: 4 mg via INTRAVENOUS
  Filled 2022-01-17: qty 2

## 2022-01-17 MED ORDER — HYDROCODONE-ACETAMINOPHEN 5-325 MG PO TABS
1.0000 | ORAL_TABLET | Freq: Once | ORAL | Status: AC
  Administered 2022-01-17: 1 via ORAL
  Filled 2022-01-17: qty 1

## 2022-01-17 MED ORDER — MORPHINE SULFATE (PF) 2 MG/ML IV SOLN: 2.0000 mg | Freq: Once | INTRAVENOUS | Status: DC

## 2022-01-17 MED ORDER — OXYCODONE HCL 5 MG PO TABS: 5.0000 mg | ORAL_TABLET | Freq: Three times a day (TID) | ORAL | 0 refills | Status: DC | PRN

## 2022-01-17 MED ORDER — IOHEXOL 300 MG/ML  SOLN
100.0000 mL | Freq: Once | INTRAMUSCULAR | Status: AC | PRN
  Administered 2022-01-17: 100 mL via INTRAVENOUS
  Filled 2022-01-17: qty 100

## 2022-01-17 MED ORDER — SODIUM CHLORIDE 0.9 % IV SOLN: 10.0000 mL/h | Freq: Once | INTRAVENOUS | Status: DC

## 2022-01-17 MED ORDER — SODIUM CHLORIDE 0.9 % IV BOLUS
1000.0000 mL | Freq: Once | INTRAVENOUS | Status: AC
  Administered 2022-01-17: 1000 mL via INTRAVENOUS

## 2022-01-17 MED ORDER — ONDANSETRON HCL 4 MG/2ML IJ SOLN: 4.0000 mg | Freq: Once | INTRAMUSCULAR | Status: AC

## 2022-01-17 NOTE — ED Notes (Signed)
Morrie Sheldon from [facility] called   patient accepted ED to ED  accepting Dr.Cory Vatsass

## 2022-01-17 NOTE — ED Notes (Signed)
Rechecked CBG d/t pt request. CBG 146 Lexie, RN notified.

## 2022-01-17 NOTE — ED Provider Notes (Signed)
Patient ready for discharge all CT is negative when suddenly he has onset of severe back and lower abdominal pain and his blood pressure falls into the 70s.  He is put in Trendelenburg we started the no other IV were giving him a liter bolus I have consented him for blood transfusion which he agrees to.  I am having the secretary call [facility] so we can arrange transfer for this patient.  CT may have picked up a possible retroperitoneal hemorrhage.   Arnaldo Natal, MD 01/17/22 1329

## 2022-01-17 NOTE — ED Notes (Signed)
Type and screen resent at this time. Right IV appears infiltrated- warm compress applied, primary rn notified.
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
## 2022-01-17 NOTE — ED Provider Notes (Signed)
----------------------------------------- °  2:03 PM on 01/17/2022 ----------------------------------------- Patient had been seen CTs were negative except for a hint of a possible retroperitoneal hemorrhage.  Patient's vital signs have been stable and patient is ready to go however he all of a sudden dropped his pressure became sweaty and urinated on himself.  He was not confused or combative.  He did not have any seizure-like activity.  Just dropped his blood pressure.  He developed severe lower abdominal pain and pain across his entire back.  I have ordered a liter of fluid stat and type and cross stat blood transfusion.  I did a FAST exam on his belly.  Patient did not have a pericardial effusion.  He had good heart motion.  He was not tachycardic.  His abdominal fast was completely clear.  Patient got about a half a liter of saline and his blood pressure came up.  It went up to [id number] systolic.  The saline was taken off the pressor bag and just allowed to draw drip slowly in.  Blood pressure has come down somewhat now is [id number] systolic but appears to be stable there.  We will watch this patient carefully.  If he drops his pressure further I will get type O blood for emergency release and give that to him.  We will also begin preparing for mass transfusion if that happens.  In the meantime I have spoken to [facility] and he has been accepted.  They are sending critical care transport up to pick him up.  Dr. Carlisle Cater has excepted for an ER to ER transport.   Nena Polio, MD 01/17/22 [id number]

## 2022-01-17 NOTE — Discharge Instructions (Signed)
Follow-up with your primary care provider if any continued problems.  Return to the emergency department if any severe worsening of your symptoms such as nausea, vomiting, visual changes, severe headache or any other urgent concerns.  A prescription for pain medication was sent to the pharmacy.  This medication is extremely strong and should not be taken while driving or operating any machinery.  Be careful as this could also cause drowsiness and increase your risk for falling.  You may use ice or heat to your muscles as needed for discomfort.  You will be more sore tomorrow than you are currently which is not unusual.

## 2022-01-17 NOTE — ED Notes (Signed)
500 ml's of NS started in left AC at this time per verbal order. Pt positioned in trendelenburg.
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
## 2022-01-17 NOTE — ED Notes (Signed)
Paper copy of blood consent signed and placed in shadow chart.

## 2022-01-17 NOTE — ED Notes (Signed)
Pt c/o dizziness and diaphoresis. EDP notified of BP of 74/48.

## 2022-01-17 NOTE — ED Notes (Signed)
Pt became hypotensive when sitting up   became diaphoretic  having increased pain to abd

## 2022-01-18 LAB — PREPARE RBC (CROSSMATCH)

## 2022-05-12 ENCOUNTER — Other Ambulatory Visit: Payer: Self-pay | Admitting: Neurosurgery

## 2022-05-12 ENCOUNTER — Ambulatory Visit
Admission: RE | Admit: 2022-05-12 | Discharge: 2022-05-12 | Disposition: A | Discharge status: Home / Self Care | Payer: Self-pay | Source: Ambulatory Visit | Attending: Neurosurgery | Admitting: Neurosurgery

## 2022-05-12 DIAGNOSIS — M545 Low back pain, unspecified: Secondary | ICD-10-CM

## 2022-05-19 ENCOUNTER — Other Ambulatory Visit: Payer: Self-pay | Admitting: Neurosurgery

## 2022-05-19 ENCOUNTER — Other Ambulatory Visit (HOSPITAL_COMMUNITY): Payer: Self-pay | Admitting: Neurosurgery

## 2022-05-19 DIAGNOSIS — K6812 Psoas muscle abscess: Secondary | ICD-10-CM

## 2022-05-19 DIAGNOSIS — L0291 Cutaneous abscess, unspecified: Secondary | ICD-10-CM

## 2022-05-25 ENCOUNTER — Ambulatory Visit
Admission: RE | Admit: 2022-05-25 | Discharge: 2022-05-25 | Disposition: A | Discharge status: Home / Self Care | Payer: Medicare HMO | Source: Ambulatory Visit | Attending: Neurosurgery | Admitting: Neurosurgery

## 2022-05-25 DIAGNOSIS — K6812 Psoas muscle abscess: Secondary | ICD-10-CM | POA: Diagnosis present

## 2022-05-25 DIAGNOSIS — L0291 Cutaneous abscess, unspecified: Secondary | ICD-10-CM | POA: Diagnosis present

## 2022-05-25 MED ORDER — GADOBUTROL 1 MMOL/ML IV SOLN
9.0000 mL | Freq: Once | INTRAVENOUS | Status: AC | PRN
  Administered 2022-05-25: 10 mL via INTRAVENOUS

## 2022-05-30 ENCOUNTER — Other Ambulatory Visit: Payer: Self-pay | Admitting: Neurosurgery

## 2022-05-30 DIAGNOSIS — G8250 Quadriplegia, unspecified: Secondary | ICD-10-CM

## 2022-05-30 DIAGNOSIS — G959 Disease of spinal cord, unspecified: Secondary | ICD-10-CM

## 2022-06-03 ENCOUNTER — Other Ambulatory Visit: Payer: Self-pay | Admitting: Student in an Organized Health Care Education/Training Program

## 2022-06-03 DIAGNOSIS — M5414 Radiculopathy, thoracic region: Secondary | ICD-10-CM

## 2022-06-03 DIAGNOSIS — M5416 Radiculopathy, lumbar region: Secondary | ICD-10-CM

## 2022-06-03 DIAGNOSIS — M48062 Spinal stenosis, lumbar region with neurogenic claudication: Secondary | ICD-10-CM

## 2022-06-04 ENCOUNTER — Ambulatory Visit
Admission: RE | Admit: 2022-06-04 | Discharge: 2022-06-04 | Disposition: A | Discharge status: Home / Self Care | Payer: Medicare HMO | Source: Ambulatory Visit | Attending: Neurosurgery | Admitting: Neurosurgery

## 2022-06-04 DIAGNOSIS — G8250 Quadriplegia, unspecified: Secondary | ICD-10-CM | POA: Insufficient documentation

## 2022-06-04 DIAGNOSIS — G959 Disease of spinal cord, unspecified: Secondary | ICD-10-CM | POA: Diagnosis present

## 2022-06-13 ENCOUNTER — Encounter: Payer: Self-pay | Admitting: Neurosurgery

## 2022-06-13 ENCOUNTER — Ambulatory Visit: Payer: Commercial Managed Care - HMO | Admitting: Neurosurgery

## 2022-06-13 VITALS — BP 126/76 | Ht 76.0 in | Wt 193.6 lb

## 2022-06-13 DIAGNOSIS — M4802 Spinal stenosis, cervical region: Secondary | ICD-10-CM

## 2022-06-13 DIAGNOSIS — M48062 Spinal stenosis, lumbar region with neurogenic claudication: Secondary | ICD-10-CM

## 2022-06-13 DIAGNOSIS — M5412 Radiculopathy, cervical region: Secondary | ICD-10-CM

## 2022-06-13 DIAGNOSIS — G959 Disease of spinal cord, unspecified: Secondary | ICD-10-CM
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
## 2022-06-23 ENCOUNTER — Telehealth: Payer: Self-pay

## 2022-06-23 NOTE — Telephone Encounter (Signed)
-----   Message from Rockey Situ sent at 06/23/2022  9:54 AM EDT ----- Regarding: neck pain Contact: [phone] Pain is having pain in his neck and shoulder. He has difficulty moving side to side in bed. He is currently taking gabapentin and tramadol. Is there something stronger that can be called in [facility]. He just had a MRI of his neck, Dr.Yarbrough is currently taking care of his lower back.

## 2022-06-24 ENCOUNTER — Other Ambulatory Visit: Payer: Self-pay | Admitting: Neurosurgery

## 2022-06-24 MED ORDER — OXYCODONE HCL 5 MG PO TABS: 5.0000 mg | ORAL_TABLET | ORAL | 0 refills | Status: AC | PRN

## 2022-06-24 NOTE — Telephone Encounter (Signed)
Can you let them know that Dr Jeannie Fend sent in a medication? Thanks

## 2022-06-24 NOTE — Telephone Encounter (Signed)
Ok. Please just make sure the patient/spouse is aware that Dr Myer Haff sent in medication for him. Thank you

## 2022-07-17 ENCOUNTER — Ambulatory Visit
Payer: Medicare HMO | Attending: Student in an Organized Health Care Education/Training Program | Admitting: Student in an Organized Health Care Education/Training Program

## 2022-07-17 ENCOUNTER — Encounter: Payer: Self-pay | Admitting: Student in an Organized Health Care Education/Training Program

## 2022-07-17 VITALS — BP 137/78 | HR 92 | Temp 97.5°F | Resp 16 | Ht 76.0 in | Wt 200.0 lb

## 2022-07-17 DIAGNOSIS — M48062 Spinal stenosis, lumbar region with neurogenic claudication: Secondary | ICD-10-CM | POA: Insufficient documentation

## 2022-07-17 DIAGNOSIS — M5416 Radiculopathy, lumbar region: Secondary | ICD-10-CM | POA: Diagnosis not present

## 2022-07-17 DIAGNOSIS — G894 Chronic pain syndrome: Secondary | ICD-10-CM | POA: Diagnosis not present
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
## 2022-07-17 NOTE — Progress Notes (Signed)
Safety precautions to be maintained throughout the outpatient stay will include: orient to surroundings, keep bed in low position, maintain call bell within reach at all times, provide assistance with transfer out of bed and ambulation.  

## 2022-07-17 NOTE — Patient Instructions (Signed)
Last dose of Eliquis today for [facility] Monday

## 2022-07-30 ENCOUNTER — Encounter: Payer: Self-pay | Admitting: Student in an Organized Health Care Education/Training Program

## 2022-07-30 ENCOUNTER — Ambulatory Visit
Payer: Medicare HMO | Attending: Student in an Organized Health Care Education/Training Program | Admitting: Student in an Organized Health Care Education/Training Program

## 2022-07-30 ENCOUNTER — Ambulatory Visit
Admission: RE | Admit: 2022-07-30 | Discharge: 2022-07-30 | Disposition: A | Discharge status: Home / Self Care | Payer: Medicare HMO | Source: Ambulatory Visit | Attending: Student in an Organized Health Care Education/Training Program | Admitting: Student in an Organized Health Care Education/Training Program

## 2022-07-30 ENCOUNTER — Other Ambulatory Visit: Payer: Self-pay

## 2022-07-30 VITALS — BP 151/95 | HR 80 | Temp 97.1°F | Resp 17 | Ht 76.0 in | Wt 195.0 lb

## 2022-07-30 DIAGNOSIS — M48062 Spinal stenosis, lumbar region with neurogenic claudication: Secondary | ICD-10-CM | POA: Insufficient documentation

## 2022-07-30 DIAGNOSIS — M5416 Radiculopathy, lumbar region: Secondary | ICD-10-CM

## 2022-07-30 DIAGNOSIS — G894 Chronic pain syndrome: Secondary | ICD-10-CM | POA: Diagnosis present

## 2022-07-30 DIAGNOSIS — M1712 Unilateral primary osteoarthritis, left knee: Secondary | ICD-10-CM | POA: Diagnosis present

## 2022-07-30 MED ORDER — SODIUM CHLORIDE (PF) 0.9 % IJ SOLN
INTRAMUSCULAR | Status: AC
  Filled 2022-07-30: qty 10

## 2022-07-30 MED ORDER — DEXAMETHASONE SODIUM PHOSPHATE 10 MG/ML IJ SOLN
10.0000 mg | Freq: Once | INTRAMUSCULAR | Status: AC
  Administered 2022-07-30: 10 mg

## 2022-07-30 MED ORDER — IOHEXOL 180 MG/ML  SOLN
INTRAMUSCULAR | Status: AC
  Filled 2022-07-30: qty 20

## 2022-07-30 MED ORDER — SODIUM CHLORIDE 0.9% FLUSH
2.0000 mL | Freq: Once | INTRAVENOUS | Status: AC
  Administered 2022-07-30: 2 mL

## 2022-07-30 MED ORDER — DEXAMETHASONE SODIUM PHOSPHATE 10 MG/ML IJ SOLN
INTRAMUSCULAR | Status: AC
  Filled 2022-07-30: qty 1

## 2022-07-30 MED ORDER — ROPIVACAINE HCL 2 MG/ML IJ SOLN
INTRAMUSCULAR | Status: AC
  Filled 2022-07-30: qty 20

## 2022-07-30 MED ORDER — LIDOCAINE HCL 2 % IJ SOLN
20.0000 mL | Freq: Once | INTRAMUSCULAR | Status: AC
  Administered 2022-07-30: 400 mg

## 2022-07-30 MED ORDER — LIDOCAINE HCL 2 % IJ SOLN
INTRAMUSCULAR | Status: AC
  Filled 2022-07-30: qty 20

## 2022-07-30 MED ORDER — IOHEXOL 180 MG/ML  SOLN
10.0000 mL | Freq: Once | INTRAMUSCULAR | Status: AC
  Administered 2022-07-30: 10 mL via EPIDURAL

## 2022-07-30 MED ORDER — ROPIVACAINE HCL 2 MG/ML IJ SOLN
2.0000 mL | Freq: Once | INTRAMUSCULAR | Status: AC
  Administered 2022-07-30: 2 mL via EPIDURAL

## 2022-07-30 NOTE — Progress Notes (Signed)
PROVIDER NOTE: Interpretation of information contained herein should be left to medically-trained personnel. Specific patient instructions are provided elsewhere under "Patient Instructions" section of medical record. This document was created in part using STT-dictation technology, any transcriptional errors that may result from this process are unintentional.  Patient: Harold Sandoval Type: Established DOB: [birth year] MRN: [id number] PCP: Gracelyn Nurse, MD  Service: Procedure DOS: 07/30/2022 Setting: Ambulatory Location: Ambulatory outpatient facility Delivery: Face-to-face Provider: Edward Jolly, MD Specialty: Interventional Pain Management Specialty designation: 09 Location: Outpatient facility Ref. Prov.: Edward Jolly, MD    Primary Reason for Visit: Interventional Pain Management Treatment. CC: Back Pain (Lumbar bilateral ) and Knee Pain (Left knee s/p joint replacement )   Procedure:           Type: Lumbar epidural steroid injection (LESI) (interlaminar) #1    Laterality: Left   Level:  L1-2 Level.  (Attempted L2/3 but unable to access IL space at that level due to arthrosis and spurs) Imaging: Fluoroscopic guidance         Anesthesia: Local anesthesia (1-2% Lidocaine) DOS: 07/30/2022  Performed by: Edward Jolly, MD  Purpose: Diagnostic/Therapeutic Indications: Lumbar radicular pain of intraspinal etiology of more than 4 weeks that has failed to respond to conservative therapy and is severe enough to impact quality of life or function. 1. Spinal stenosis, lumbar region, with neurogenic claudication   2. Lumbar radiculopathy   3. Chronic pain syndrome   4. Primary osteoarthritis of left knee    NAS-11 Pain score:   Pre-procedure: 4 /10   Post-procedure: 3  (moving and walking)/10   Patient stopped Eliquis 3 days prior  Position / Prep / Materials:  Position: Prone w/ head of the table raised (slight reverse trendelenburg) to facilitate breathing.  Prep solution:  DuraPrep (Iodine Povacrylex [0.7% available iodine] and Isopropyl Alcohol, 74% w/w) Prep Area: Entire Posterior Lumbar Region from lower scapular tip down to mid buttocks area and from flank to flank. Materials:  Tray: Epidural tray Needle(s):  Type: Epidural needle (Tuohy) Gauge (G):  22 Length: Regular (3.5-in) Qty: 1  Pre-op H&P Assessment:  Harold Sandoval is a 81 y.o. (year old), male patient, seen today for interventional treatment. 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 Harold Sandoval has a current medication list which includes the following prescription(s): apixaban, atorvastatin, brimonidine, brinzolamide, vitamin d3, claritin, desonide, fluticasone, gabapentin, latanoprost, and metformin. His primarily concern today is the Back Pain (Lumbar bilateral ) and Knee Pain (Left knee s/p joint replacement )  Initial Vital Signs:  Pulse/HCG Rate: 75  Temp:  (!) 97.1 F (36.2 C) Resp: 16 BP: 115/75 SpO2: 100 %  BMI: Estimated body mass index is 23.74 kg/m as calculated from the following:   Height as of this encounter: 6\' 4"  (1.93 m).   Weight as of this encounter: 195 lb (88.5 kg).  Risk Assessment: Allergies: Reviewed. He is allergic to gadavist [gadobutrol] and lactose intolerance (gi).  Allergy Precautions: None required Coagulopathies: Reviewed. None identified.  Blood-thinner therapy: None at this time Active Infection(s): Reviewed. None identified. Harold Sandoval is afebrile  Site Confirmation: Harold Sandoval was asked to confirm the procedure and laterality before marking the site Procedure checklist: Completed Consent: Before the  procedure and under the influence of no  sedative(s), amnesic(s), or anxiolytics, the patient was informed of the treatment options, risks and possible complications. To fulfill our ethical and legal obligations, as recommended by the American Medical Association's Code of Ethics, I have informed the patient of my clinical impression; the nature and purpose of the treatment or procedure; the risks, benefits, and possible complications of the intervention; the alternatives, including doing nothing; the risk(s) and benefit(s) of the alternative treatment(s) or procedure(s); and the risk(s) and benefit(s) of doing nothing. The patient was provided information about the general risks and possible complications associated with the procedure. These may include, but are not limited to: failure to achieve desired goals, infection, bleeding, organ or nerve damage, allergic reactions, paralysis, and death. In addition, the patient was informed of those risks and complications associated to Spine-related procedures, such as failure to decrease pain; infection (i.e.: Meningitis, epidural or intraspinal abscess); bleeding (i.e.: epidural hematoma, subarachnoid hemorrhage, or any other type of intraspinal or peri-dural bleeding); organ or nerve damage (i.e.: Any type of peripheral nerve, nerve root, or spinal cord injury) with subsequent damage to sensory, motor, and/or autonomic systems, resulting in permanent pain, numbness, and/or weakness of one or several areas of the body; allergic reactions; (i.e.: anaphylactic reaction); and/or death. Furthermore, the patient was informed of those risks and complications associated with the medications. These include, but are not limited to: allergic reactions (i.e.: anaphylactic or anaphylactoid reaction(s)); adrenal axis suppression; blood sugar elevation that in diabetics may result in ketoacidosis or comma; water retention that in patients with history of congestive heart failure  may result in shortness of breath, pulmonary edema, and decompensation with resultant heart failure; weight gain; swelling or edema; medication-induced neural toxicity; particulate matter embolism and blood vessel occlusion with resultant organ, and/or nervous system infarction; and/or aseptic necrosis of one or more joints. Finally, the patient was informed that Medicine is not an exact science; therefore, there is also the possibility of unforeseen or unpredictable risks and/or possible complications that may result in a catastrophic outcome. The patient indicated having understood very clearly. We have given the patient no guarantees and we have made no promises. Enough time was given to the patient to ask questions, all of which were answered to the patient's satisfaction. Harold Sandoval has indicated that he wanted to continue with the procedure. Attestation: I, the ordering provider, attest that I have discussed with the patient the benefits, risks, side-effects, alternatives, likelihood of achieving goals, and potential problems during recovery for the procedure that I have provided informed consent. Date  Time: 07/30/2022 12:27 PM  Pre-Procedure Preparation:  Monitoring: As per clinic protocol. Respiration, ETCO2, SpO2, BP, heart rate and rhythm monitor placed and checked for adequate function Safety Precautions: Patient was assessed for positional comfort and pressure points before starting the procedure. Time-out: I initiated and conducted the "Time-out" before starting the procedure, as per protocol. The patient was asked to participate by confirming the accuracy of the "Time Out" information. Verification of the correct person, site, and procedure were performed and confirmed by me, the nursing staff, and the patient. "Time-out" conducted as per Joint Commission's Universal Protocol (UP.01.01.01). Time: 1338  Description/Narrative of Procedure:          Target: Epidural space via interlaminar  opening, initially targeting the lower laminar border of the superior vertebral body. Region: Lumbar Approach: Percutaneous paravertebral  Rationale (medical necessity): procedure needed and proper for the diagnosis and/or treatment of the patient's medical symptoms and needs. Procedural Technique Safety Precautions: Aspiration looking for  blood return was conducted prior to all injections. At no point did we inject any substances, as a needle was being advanced. No attempts were made at seeking any paresthesias. Safe injection practices and needle disposal techniques used. Medications properly checked for expiration dates. SDV (single dose vial) medications used. Description of the Procedure: Protocol guidelines were followed. The procedure needle was introduced through the skin, ipsilateral to the reported pain, and advanced to the target area. Bone was contacted and the needle walked caudad, until the lamina was cleared. The epidural space was identified using "loss-of-resistance technique" with 2-3 ml of PF-NaCl (0.9% NSS), in a 5cc LOR glass syringe.  Vitals:   07/30/22 1259 07/30/22 1332 07/30/22 1342 07/30/22 1347  BP: 115/75 (!) 149/98 (!) 149/95 (!) 151/95  Pulse: 75 90 81 80  Resp: 16 17 16 17   Temp: (!) 97.1 F (36.2 C)     TempSrc: Temporal     SpO2: 100% 97% 99% 99%  Weight: 195 lb (88.5 kg)     Height: 6\' 4"  (1.93 m)       Start Time: 1338 hrs. End Time: 1344 hrs.  Imaging Guidance (Spinal):          Type of Imaging Technique: Fluoroscopy Guidance (Spinal) Indication(s): Assistance in needle guidance and placement for procedures requiring needle placement in or near specific anatomical locations not easily accessible without such assistance. Exposure Time: Please see nurses notes. Contrast: Before injecting any contrast, we confirmed that the patient did not have an allergy to iodine, shellfish, or radiological contrast. Once satisfactory needle placement was completed at  the desired level, radiological contrast was injected. Contrast injected under live fluoroscopy. No contrast complications. See chart for type and volume of contrast used. Fluoroscopic Guidance: I was personally present during the use of fluoroscopy. "Tunnel Vision Technique" used to obtain the best possible view of the target area. Parallax error corrected before commencing the procedure. "Direction-depth-direction" technique used to introduce the needle under continuous pulsed fluoroscopy. Once target was reached, antero-posterior, oblique, and lateral fluoroscopic projection used confirm needle placement in all planes. Images permanently stored in EMR. Interpretation: I personally interpreted the imaging intraoperatively. Adequate needle placement confirmed in multiple planes. Appropriate spread of contrast into desired area was observed. No evidence of afferent or efferent intravascular uptake. No intrathecal or subarachnoid spread observed. Permanent images saved into the patient's record.  Antibiotic Prophylaxis:   Anti-infectives (From admission, onward)    None      Indication(s): None identified  Post-operative Assessment:  Post-procedure Vital Signs:  Pulse/HCG Rate: 80  Temp:  (!) 97.1 F (36.2 C) Resp: 17 BP:  (!) 151/95 SpO2: 99 %  EBL: None  Complications: No immediate post-treatment complications observed by team, or reported by patient.  Note: The patient tolerated the entire procedure well. A repeat set of vitals were taken after the procedure and the patient was kept under observation following institutional policy, for this type of procedure. Post-procedural neurological assessment was performed, showing return to baseline, prior to discharge. The patient was provided with post-procedure discharge instructions, including a section on how to identify potential problems. Should any problems arise concerning this procedure, the patient was given instructions to  immediately contact , at any time, without hesitation. In any case, we plan to contact the patient by telephone for a follow-up status report regarding this interventional procedure.  Comments:  No additional relevant information.  Plan of Care  Ok to restart Eliquis tomorrow  Orders:  Orders Placed This  Encounter  Procedures   KNEE INJECTION    Local Anesthetic & Steroid injection.    Standing Status:   Future    Standing Expiration Date:   10/30/2022    Scheduling Instructions:     LEFT    Order Specific Question:   Where will this procedure be performed?    Answer:   [facility]   DG [facility] C-ARM 1-60 MIN NO REPORT    [facility]raoperative [facility].    Standing Status:   Standing    Number of Occurrences:   1    Order Specific Question:   Reason for exam:    Answer:   Assistance in needle guidance and placement for procedures requiring needle placement in or near specific anatomical locations not easily accessible without such assistance.     Medications ordered for procedure: Meds ordered this encounter  Medications   iohexol (OMNIPAQUE) 180 MG/ML injection 10 mL    Must be Myelogram-compatible. If not available, you may substitute with a water-soluble, non-ionic, hypoallergenic, myelogram-compatible radiological contrast medium.   lidocaine (XYLOCAINE) 2 % (with pres) injection 400 mg   sodium chloride flush (NS) 0.9 % injection 2 mL   ropivacaine (PF) 2 mg/mL (0.2%) (NAROPIN) injection 2 mL   dexamethasone (DECADRON) injection 10 mg   Medications administered: We administered iohexol, lidocaine, sodium chloride flush, ropivacaine (PF) 2 mg/mL (0.2%), and dexamethasone.  See the medical record for exact dosing, route, and time of administration.  Follow-up plan:   Return in about 3 weeks (around 08/20/2022) for Left knee IA steroid + PPE F2F (afteroon of procedure day).    Recent Visits Date Type Provider  Dept  07/17/22 Office Visit Edward JollyLateef, Sai Zinn, MD [facility]  Showing recent visits within past 90 days and meeting all other requirements Today's Visits Date Type Provider Dept  07/30/22 Procedure visit Edward JollyLateef, Adelena Desantiago, MD [facility]  Showing today's visits and meeting all other requirements Future Appointments No visits were found meeting these conditions. Showing future appointments within next 90 days and meeting all other requirements  Disposition: Discharge home  Discharge (Date  Time): 07/30/2022; 1350 hrs.   Primary Care Physician: Gracelyn NurseJohnston, [name], MD [facility] Note by: Edward JollyBilal Nirali Magouirk, MD Date: 07/30/2022; Time: 1:52 PM  Disclaimer:  Medicine is not an Visual merchandiserexact science. The only guarantee in medicine is that nothing is guaranteed. It is important to note that the decision to proceed with this intervention was based on the information collected from the patient. The Data and conclusions were drawn from the patient's questionnaire, the interview, and the physical examination. Because the information was provided in large part by the patient, it cannot be guaranteed that it has not been purposely or unconsciously manipulated. Every effort has been made to obtain as much relevant data as possible for this evaluation. It is important to note that the conclusions that lead to this procedure are derived in large part from the available data. Always take into account that the treatment will also be dependent on availability of resources and existing treatment guidelines, considered by other Pain Management Practitioners as being common knowledge and practice, at the time of the intervention. For Medico-Legal purposes, it is also important to point out that variation in procedural techniques and pharmacological choices are the acceptable norm. The indications, contraindications, technique, and results of the above procedure should only be  interpreted and judged by a Board-Certified Interventional Pain Specialist  with extensive familiarity and expertise in the same exact procedure and technique.

## 2022-07-30 NOTE — Patient Instructions (Signed)

## 2022-07-30 NOTE — Progress Notes (Signed)
Safety precautions to be maintained throughout the outpatient stay will include: orient to surroundings, keep bed in low position, maintain call bell within reach at all times, provide assistance with transfer out of bed and ambulation.  

## 2022-07-31 ENCOUNTER — Telehealth: Payer: Self-pay | Admitting: *Deleted

## 2022-07-31 ENCOUNTER — Ambulatory Visit: Payer: Medicare HMO | Admitting: Neurosurgery

## 2022-07-31 NOTE — Telephone Encounter (Signed)
No problems post procedure. 

## 2022-08-14 ENCOUNTER — Encounter: Payer: Self-pay | Admitting: Neurosurgery

## 2022-08-14 ENCOUNTER — Ambulatory Visit: Payer: Medicare HMO | Admitting: Neurosurgery

## 2022-08-14 VITALS — BP 125/75 | HR 87 | Ht 76.0 in | Wt 196.0 lb

## 2022-08-14 DIAGNOSIS — M48062 Spinal stenosis, lumbar region with neurogenic claudication: Secondary | ICD-10-CM

## 2022-08-14 DIAGNOSIS — G959 Disease of spinal cord, unspecified: Secondary | ICD-10-CM

## 2022-08-14 DIAGNOSIS — M4030 Flatback syndrome, site unspecified: Secondary | ICD-10-CM | POA: Diagnosis not present
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
## 2022-08-20 ENCOUNTER — Encounter: Payer: Self-pay | Admitting: Student in an Organized Health Care Education/Training Program

## 2022-08-20 ENCOUNTER — Ambulatory Visit
Payer: Medicare HMO | Attending: Student in an Organized Health Care Education/Training Program | Admitting: Student in an Organized Health Care Education/Training Program

## 2022-08-20 VITALS — BP 149/82 | HR 107 | Temp 97.0°F | Resp 18 | Ht 76.0 in | Wt 200.0 lb

## 2022-08-20 DIAGNOSIS — M1712 Unilateral primary osteoarthritis, left knee: Secondary | ICD-10-CM | POA: Insufficient documentation

## 2022-08-20 MED ORDER — ROPIVACAINE HCL 2 MG/ML IJ SOLN
2.0000 mL | Freq: Once | INTRAMUSCULAR | Status: AC
  Administered 2022-08-20: 4 mL via INTRA_ARTICULAR

## 2022-08-20 MED ORDER — ROPIVACAINE HCL 2 MG/ML IJ SOLN
INTRAMUSCULAR | Status: AC
  Filled 2022-08-20: qty 20

## 2022-08-20 MED ORDER — LIDOCAINE HCL (PF) 2 % IJ SOLN
INTRAMUSCULAR | Status: AC
  Filled 2022-08-20: qty 5

## 2022-08-20 MED ORDER — METHYLPREDNISOLONE ACETATE 40 MG/ML IJ SUSP
INTRAMUSCULAR | Status: AC
  Filled 2022-08-20: qty 1

## 2022-08-20 MED ORDER — METHYLPREDNISOLONE ACETATE 40 MG/ML IJ SUSP
40.0000 mg | Freq: Once | INTRAMUSCULAR | Status: AC
  Administered 2022-08-20: 40 mg via INTRA_ARTICULAR

## 2022-08-20 MED ORDER — LIDOCAINE HCL (PF) 2 % IJ SOLN
5.0000 mL | Freq: Once | INTRAMUSCULAR | Status: AC
  Administered 2022-08-20: 5 mL

## 2022-08-20 NOTE — Progress Notes (Signed)
Safety precautions to be maintained throughout the outpatient stay will include: orient to surroundings, keep bed in low position, maintain call bell within reach at all times, provide assistance with transfer out of bed and ambulation.  

## 2022-08-20 NOTE — Progress Notes (Signed)
PROVIDER NOTE: Information contained herein reflects review and annotations entered in association with encounter. Interpretation of such information and data should be left to medically-trained personnel. Information provided to patient can be located elsewhere in the medical record under "Patient Instructions". Document created using STT-dictation technology, any transcriptional errors that may result from process are unintentional.    Patient: Harold Sandoval  Service Category: Procedure  Provider: Edward Jolly, MD  DOB: 07-23-1941  DOS: 08/20/2022  Location: [facility]  MRN: [id number]  Setting: Ambulatory - outpatient  Referring Provider: Gracelyn Nurse, MD  Type: Established Patient  Specialty: Interventional Pain Management  PCP: Gracelyn Nurse, MD   Primary Reason for Visit: Interventional Pain Management Treatment. CC: Knee Pain (left)  Procedure:          Anesthesia, Analgesia, Anxiolysis:  Type: Therapeutic Intra-Articular Local anesthetic and steroid Knee Injection #2  Region: Medial infrapatellar Knee Region Level: Knee Joint Laterality: Left knee  Type: Local Anesthesia Indication(s): Analgesia         Local Anesthetic: Lidocaine 1-2% Route: Infiltration ([place]/IM) IV Access: Declined Sedation: Declined   Position: Sitting   Indications: 1. Primary osteoarthritis of left knee    Pain Score: Pre-procedure: 4 /10 Post-procedure: 4 /10   Pre-op H&P Assessment:  Mr. Slayton is a 81 y.o. (year old), male patient, seen today for interventional treatment. 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 Mr. Kiedrowski has a  current medication list which includes the following prescription(s): apixaban, atorvastatin, brimonidine, brinzolamide, vitamin d3, claritin, desonide, fluticasone, gabapentin, latanoprost, and metformin. His primarily concern today is the Knee Pain (left)  Initial Vital Signs:  Pulse/HCG Rate: (!) 107  Temp: (!) 97 F (36.1 C) Resp: 18 BP: (!) 149/82 SpO2: 99 %  BMI: Estimated body mass index is 24.34 kg/m as calculated from the following:   Height as of this encounter: 6\' 4"  (1.93 m).   Weight as of this encounter: 200 lb (90.7 kg).  Risk Assessment: Allergies: Reviewed. He is allergic to gadavist [gadobutrol] and lactose intolerance (gi).  Allergy Precautions: None required Coagulopathies: Reviewed. None identified.  Blood-thinner therapy: None at this time Active Infection(s): Reviewed. None identified. Mr. Cumpton is afebrile  Site Confirmation: Mr. Mcmanamon was asked to confirm the procedure and laterality before marking the site Procedure checklist: Completed Consent: Before the procedure and under the influence of no sedative(s), amnesic(s), or anxiolytics, the patient was informed of the treatment options, risks and possible complications. To fulfill our ethical and legal obligations, as recommended by the American Medical Association's Code of Ethics, I have informed the patient of my clinical impression; the nature and purpose of the treatment or procedure; the risks, benefits, and possible complications of the intervention; the alternatives, including doing nothing; the risk(s) and benefit(s) of the alternative treatment(s) or procedure(s); and the risk(s) and benefit(s) of doing nothing. The patient was provided information about the general risks and possible complications associated with the procedure. These may include, but are not limited to: failure to achieve desired goals, infection, bleeding, organ or nerve damage, allergic reactions, paralysis, and death. In addition,  the patient was informed of those risks and complications associated to the procedure, such as failure to decrease pain; infection; bleeding; organ or nerve damage  with subsequent damage to sensory, motor, and/or autonomic systems, resulting in permanent pain, numbness, and/or weakness of one or several areas of the body; allergic reactions; (i.e.: anaphylactic reaction); and/or death. Furthermore, the patient was informed of those risks and complications associated with the medications. These include, but are not limited to: allergic reactions (i.e.: anaphylactic or anaphylactoid reaction(s)); adrenal axis suppression; blood sugar elevation that in diabetics may result in ketoacidosis or comma; water retention that in patients with history of congestive heart failure may result in shortness of breath, pulmonary edema, and decompensation with resultant heart failure; weight gain; swelling or edema; medication-induced neural toxicity; particulate matter embolism and blood vessel occlusion with resultant organ, and/or nervous system infarction; and/or aseptic necrosis of one or more joints. Finally, the patient was informed that Medicine is not an exact science; therefore, there is also the possibility of unforeseen or unpredictable risks and/or possible complications that may result in a catastrophic outcome. The patient indicated having understood very clearly. We have given the patient no guarantees and we have made no promises. Enough time was given to the patient to ask questions, all of which were answered to the patient's satisfaction. Mr. Treanor has indicated that he wanted to continue with the procedure. Attestation: I, the ordering provider, attest that I have discussed with the patient the benefits, risks, side-effects, alternatives, likelihood of achieving goals, and potential problems during recovery for the procedure that I have provided informed consent. Date  Time: 08/20/2022 10:37  AM  Pre-Procedure Preparation:  Monitoring: As per clinic protocol. Respiration, ETCO2, SpO2, BP, heart rate and rhythm monitor placed and checked for adequate function Safety Precautions: Patient was assessed for positional comfort and pressure points before starting the procedure. Time-out: I initiated and conducted the "Time-out" before starting the procedure, as per protocol. The patient was asked to participate by confirming the accuracy of the "Time Out" information. Verification of the correct person, site, and procedure were performed and confirmed by me, the nursing staff, and the patient. "Time-out" conducted as per Joint Commission's Universal Protocol (UP.01.01.01). Time: 1055  Description of Procedure:          Target Area: Knee Joint Approach: Just above the Medial tibial plateau, lateral to the infrapatellar tendon. Area Prepped: Entire knee area, from the mid-thigh to the mid-shin. DuraPrep (Iodine Povacrylex [0.7% available iodine] and Isopropyl Alcohol, 74% w/w) Safety Precautions: Aspiration looking for blood return was conducted prior to all injections. At no point did we inject any substances, as a needle was being advanced. No attempts were made at seeking any paresthesias. Safe injection practices and needle disposal techniques used. Medications properly checked for expiration dates. SDV (single dose vial) medications used. Description of the Procedure: Protocol guidelines were followed. The patient was placed in position over the fluoroscopy table. The target area was identified and the area prepped in the usual manner. Skin & deeper tissues infiltrated with local anesthetic. Appropriate amount of time allowed to pass for local anesthetics to take effect. The procedure needles were then advanced to the target area. Proper needle placement secured. Negative aspiration confirmed. Solution injected in intermittent fashion, asking for systemic symptoms every 0.5cc of injectate. The  needles were then removed and the area cleansed, making sure to leave some of the prepping solution back to take advantage of its long term bactericidal properties. Vitals:   08/20/22 1040  BP: (!) 149/82  Pulse: (!) 107  Resp: 18  Temp: (!) 97 F (36.1 C)  SpO2: 99%  Weight:  200 lb (90.7 kg)  Height: 6\' 4"  (1.93 m)    Start Time: 1055 hrs. End Time: 1056 hrs. Materials:  Needle(s) Type: Regular needle Gauge: 25G Length: 1.5-in Medication(s): Please see orders for medications and dosing details. 5 cc solution made of 4 cc of 0.2% ropivacaine, 1 cc of methylprednisolone, 40 mg/cc.  Post-operative Assessment:  Post-procedure Vital Signs:  Pulse/HCG Rate: (!) 107  Temp: (!) 97 F (36.1 C) Resp: 18 BP: (!) 149/82 SpO2: 99 %  EBL: None  Complications: No immediate post-treatment complications observed by team, or reported by patient.  Note: The patient tolerated the entire procedure well. A repeat set of vitals were taken after the procedure and the patient was kept under observation following institutional policy, for this type of procedure. Post-procedural neurological assessment was performed, showing return to baseline, prior to discharge. The patient was provided with post-procedure discharge instructions, including a section on how to identify potential problems. Should any problems arise concerning this procedure, the patient was given instructions to immediately contact , at any time, without hesitation. In any case, we plan to contact the patient by telephone for a follow-up status report regarding this interventional procedure.  Comments:  No additional relevant information.  Plan of Care   Medications ordered for procedure: Meds ordered this encounter  Medications   methylPREDNISolone acetate (DEPO-MEDROL) injection 40 mg   lidocaine HCl (PF) (XYLOCAINE) 2 % injection 5 mL   ropivacaine (PF) 2 mg/mL (0.2%) (NAROPIN) injection 2 mL   Medications administered: We  administered methylPREDNISolone acetate, lidocaine HCl (PF), and ropivacaine (PF) 2 mg/mL (0.2%).  See the medical record for exact dosing, route, and time of administration.  Follow-up plan:   Return if symptoms worsen or fail to improve.    Recent Visits Date Type Provider Dept  07/30/22 Procedure visit 08/01/22, MD [facility]  07/17/22 Office Visit 09/16/22, MD [facility]  Showing recent visits within past 90 days and meeting all other requirements Today's Visits Date Type Provider Dept  08/20/22 Procedure visit 08/22/22, MD [facility]  Showing today's visits and meeting all other requirements Future Appointments No visits were found meeting these conditions. Showing future appointments within next 90 days and meeting all other requirements  Disposition: Discharge home  Discharge (Date  Time): 08/20/2022; 1105 hrs.   Primary Care Physician: 08/22/2022, MD Location: [facility] Note by: [facility], MD Date: 08/20/2022; Time: 11:04 AM  Disclaimer:  Medicine is not an exact science. The only guarantee in medicine is that nothing is guaranteed. It is important to note that the decision to proceed with this intervention was based on the information collected from the patient. The Data and conclusions were drawn from the patient's questionnaire, the interview, and the physical examination. Because the information was provided in large part by the patient, it cannot be guaranteed that it has not been purposely or unconsciously manipulated. Every effort has been made to obtain as much relevant data as possible for this evaluation. It is important to note that the conclusions that lead to this procedure are derived in large part from the available data. Always take into account that the treatment will also be dependent on availability of resources and existing treatment guidelines, considered by other Pain  Management Practitioners as being common knowledge and practice, at the time of the intervention. For Medico-Legal purposes, it is also important to point out that variation in procedural techniques and pharmacological choices  are the acceptable norm. The indications, contraindications, technique, and results of the above procedure should only be interpreted and judged by a Board-Certified Interventional Pain Specialist with extensive familiarity and expertise in the same exact procedure and technique.

## 2022-08-21 ENCOUNTER — Telehealth: Payer: Self-pay | Admitting: *Deleted

## 2022-08-21 NOTE — Telephone Encounter (Signed)
Post procedure call;  patient reports that he is doing pretty good.   Patient is asking for records for his lawyer d/t the fact that this is a workers comp case.  Told him he would have to go through medical records.  Number given.

## 2023-01-27 ENCOUNTER — Ambulatory Visit (INDEPENDENT_AMBULATORY_CARE_PROVIDER_SITE_OTHER): Payer: Medicare HMO | Admitting: Neurosurgery

## 2023-01-27 ENCOUNTER — Encounter: Payer: Self-pay | Admitting: Neurosurgery

## 2023-01-27 VITALS — BP 130/72 | Ht 76.0 in | Wt 200.0 lb

## 2023-01-27 DIAGNOSIS — G894 Chronic pain syndrome: Secondary | ICD-10-CM
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
## 2023-02-10 ENCOUNTER — Ambulatory Visit
Payer: Medicare HMO | Attending: Student in an Organized Health Care Education/Training Program | Admitting: Student in an Organized Health Care Education/Training Program

## 2023-02-10 ENCOUNTER — Encounter: Payer: Self-pay | Admitting: Student in an Organized Health Care Education/Training Program

## 2023-02-10 VITALS — BP 143/71 | HR 85 | Temp 98.1°F | Resp 16 | Ht 76.0 in | Wt 200.0 lb

## 2023-02-10 DIAGNOSIS — M7918 Myalgia, other site: Secondary | ICD-10-CM | POA: Insufficient documentation

## 2023-02-10 DIAGNOSIS — M533 Sacrococcygeal disorders, not elsewhere classified: Secondary | ICD-10-CM | POA: Insufficient documentation

## 2023-02-10 DIAGNOSIS — G894 Chronic pain syndrome: Secondary | ICD-10-CM | POA: Insufficient documentation

## 2023-02-10 DIAGNOSIS — M1712 Unilateral primary osteoarthritis, left knee: Secondary | ICD-10-CM | POA: Diagnosis not present

## 2023-02-10 DIAGNOSIS — G8929 Other chronic pain: Secondary | ICD-10-CM | POA: Diagnosis present

## 2023-02-10 NOTE — Patient Instructions (Addendum)
____________________________________________________________________________________________  General Risks and Possible Complications  Patient Responsibilities: It is important that you read this as it is part of your informed consent. It is our duty to inform you of the risks and possible complications associated with treatments offered to you. It is your responsibility as a patient to read this and to ask questions about anything that is not clear or that you believe was not covered in this document.  Patient's Rights: You have the right to refuse treatment. You also have the right to change your mind, even after initially having agreed to have the treatment done. However, under this last option, if you wait until the last second to change your mind, you may be charged for the materials used up to that point.  Introduction: Medicine is not an Chief Strategy Officer. Everything in Medicine, including the lack of treatment(s), carries the potential for danger, harm, or loss (which is by definition: Risk). In Medicine, a complication is a secondary problem, condition, or disease that can aggravate an already existing one. All treatments carry the risk of possible complications. The fact that a side effects or complications occurs, does not imply that the treatment was conducted incorrectly. It must be clearly understood that these can happen even when everything is done following the highest safety standards.  No treatment: You can choose not to proceed with the proposed treatment alternative. The "PRO(s)" would include: avoiding the risk of complications associated with the therapy. The "CON(s)" would include: not getting any of the treatment benefits. These benefits fall under one of three categories: diagnostic; therapeutic; and/or palliative. Diagnostic benefits include: getting information which can ultimately lead to improvement of the disease or symptom(s). Therapeutic benefits are those associated with  the successful treatment of the disease. Finally, palliative benefits are those related to the decrease of the primary symptoms, without necessarily curing the condition (example: decreasing the pain from a flare-up of a chronic condition, such as incurable terminal cancer).  General Risks and Complications: These are associated to most interventional treatments. They can occur alone, or in combination. They fall under one of the following six (6) categories: no benefit or worsening of symptoms; bleeding; infection; nerve damage; allergic reactions; and/or death. No benefits or worsening of symptoms: In Medicine there are no guarantees, only probabilities. No healthcare provider can ever guarantee that a medical treatment will work, they can only state the probability that it may. Furthermore, there is always the possibility that the condition may worsen, either directly, or indirectly, as a consequence of the treatment. Bleeding: This is more common if the patient is taking a blood thinner, either prescription or over the counter (example: Goody Powders, Fish oil, Aspirin, Garlic, etc.), or if suffering a condition associated with impaired coagulation (example: Hemophilia, cirrhosis of the liver, low platelet counts, etc.). However, even if you do not have one on these, it can still happen. If you have any of these conditions, or take one of these drugs, make sure to notify your treating physician. Infection: This is more common in patients with a compromised immune system, either due to disease (example: diabetes, cancer, human immunodeficiency virus [HIV], etc.), or due to medications or treatments (example: therapies used to treat cancer and rheumatological diseases). However, even if you do not have one on these, it can still happen. If you have any of these conditions, or take one of these drugs, make sure to notify your treating physician. Nerve Damage: This is more common when the treatment is an  invasive one, but it can also happen with the use of medications, such as those used in the treatment of cancer. The damage can occur to small secondary nerves, or to large primary ones, such as those in the spinal cord and brain. This damage may be temporary or permanent and it may lead to impairments that can range from temporary numbness to permanent paralysis and/or brain death. Allergic Reactions: Any time a substance or material comes in contact with our body, there is the possibility of an allergic reaction. These can range from a mild skin rash (contact dermatitis) to a severe systemic reaction (anaphylactic reaction), which can result in death. Death: In general, any medical intervention can result in death, most of the time due to an unforeseen complication. ____________________________________________________________________________________________    ______________________________________________________________________  Preparing for your procedure  Appointments: If you think you may not be able to keep your appointment, call 24-48 hours in advance to cancel. We need time to make it available to others.  During your procedure appointment there will be: No Prescription Refills. No disability issues to discussed. No medication changes or discussions.  Instructions: Food intake: Avoid eating anything solid for at least 8 hours prior to your procedure. Clear liquid intake: You may take clear liquids such as water up to 2 hours prior to your procedure. (No carbonated drinks. No soda.) Transportation: Unless otherwise stated by your physician, bring a driver. Morning Medicines: Except for blood thinners, take all of your other morning medications with a sip of water. Make sure to take your heart and blood pressure medicines. If your blood pressure's lower number is above 100, the case will be rescheduled. Blood thinners: Make sure to stop your blood thinners as instructed.  If you take  a blood thinner, but were not instructed to stop it, call our office [phone] and ask to talk to a nurse. Not stopping a blood thinner prior to certain procedures could lead to serious complications. Diabetics on insulin: Notify the staff so that you can be scheduled 1st case in the morning. If your diabetes requires high dose insulin, take only  of your normal insulin dose the morning of the procedure and notify the staff that you have done so. Preventing infections: Shower with an antibacterial soap the morning of your procedure.  Build-up your immune system: Take 1000 mg of Vitamin C with every meal (3 times a day) the day prior to your procedure. Antibiotics: Inform the nursing staff if you are taking any antibiotics or if you have any conditions that may require antibiotics prior to procedures. (Example: recent joint implants)   Pregnancy: If you are pregnant make sure to notify the nursing staff. Not doing so may result in injury to the fetus, including death.  Sickness: If you have a cold, fever, or any active infections, call and cancel or reschedule your procedure. Receiving steroids while having an infection may result in complications. Arrival: You must be in the facility at least 30 minutes prior to your scheduled procedure. Tardiness: Your scheduled time is also the cutoff time. If you do not arrive at least 15 minutes prior to your procedure, you will be rescheduled.  Children: Do not bring any children with you. Make arrangements to keep them home. Dress appropriately: There is always a possibility that your clothing may get soiled. Avoid long dresses. Valuables: Do not bring any jewelry or valuables.  Reasons to call and reschedule or cancel your procedure: (Following these recommendations will minimize the risk  of a serious complication.) Surgeries: Avoid having procedures within 2 weeks of any surgery. (Avoid for 2 weeks before or after any surgery). Flu Shots: Avoid having  procedures within 2 weeks of a flu shots or . (Avoid for 2 weeks before or after immunizations). Barium: Avoid having a procedure within 7-10 days after having had a radiological study involving the use of radiological contrast. (Myelograms, Barium swallow or enema study). Heart attacks: Avoid any elective procedures or surgeries for the initial 6 months after a "Myocardial Infarction" (Heart Attack). Blood thinners: It is imperative that you stop these medications before procedures. Let us know if you if you take any blood thinner.  Infection: Avoid procedures during or within two weeks of an infection (including chest colds or gastrointestinal problems). Symptoms associated with infections include: Localized redness, fever, chills, night sweats or profuse sweating, burning sensation when voiding, cough, congestion, stuffiness, runny nose, sore throat, diarrhea, nausea, vomiting, cold or Flu symptoms, recent or current infections. It is specially important if the infection is over the area that we intend to treat. Heart and lung problems: Symptoms that may suggest an active cardiopulmonary problem include: cough, chest pain, breathing difficulties or shortness of breath, dizziness, ankle swelling, uncontrolled high or unusually low blood pressure, and/or palpitations. If you are experiencing any of these symptoms, cancel your procedure and contact your primary care physician for an evaluation.  Remember:  Regular Business hours are:  Monday to Thursday 8:00 AM to 4:00 PM  Provider's Schedule: Milinda Pointer, MD:  Procedure days: Tuesday and Thursday 7:30 AM to 4:00 PM  Gillis Santa, MD:  Procedure days: Monday and Wednesday 7:30 AM to 4:00 PM  ______________________________________________________________________   Sacroiliac (SI) Joint Injection Patient Information  Description: The sacroiliac joint connects the scrum (very low back and tailbone) to the ilium (a pelvic bone which also  forms half of the hip joint).  Normally this joint experiences very little motion.  When this joint becomes inflamed or unstable low back and or hip and pelvis pain may result.  Injection of this joint with local anesthetics (numbing medicines) and steroids can provide diagnostic information and reduce pain.  This injection is performed with the aid of x-ray guidance into the tailbone area while you are lying on your stomach.   You may experience an electrical sensation down the leg while this is being done.  You may also experience numbness.  We also may ask if we are reproducing your normal pain during the injection.  Conditions which may be treated SI injection:  Low back, buttock, hip or leg pain  Preparation for the Injection:  Do not eat any solid food or dairy products within 8 hours of your appointment.  You may drink clear liquids up to 3 hours before appointment.  Clear liquids include water, black coffee, juice or soda.  No milk or cream please. You may take your regular medications, including pain medications with a sip of water before your appointment.  Diabetics should hold regular insulin (if take separately) and take 1/2 normal NPH dose the morning of the procedure.  Carry some sugar containing items with you to your appointment. A driver must accompany you and be prepared to drive you home after your procedure. Bring all of your current medications with you. An IV may be inserted and sedation may be given at the discretion of the physician. A blood pressure cuff, EKG and other monitors will often be applied during the procedure.  Some patients may need  to have extra oxygen administered for a short period.  You will be asked to provide medical information, including your allergies, prior to the procedure.  We must know immediately if you are taking blood thinners (like Coumadin/Warfarin) or if you are allergic to IV iodine contrast (dye).  We must know if you could possible be  pregnant.  Possible side effects:  Bleeding from needle site Infection (rare, may require surgery) Nerve injury (rare) Numbness & tingling (temporary) A brief convulsion or seizure Light-headedness (temporary) Pain at injection site (several days) Decreased blood pressure (temporary) Weakness in the leg (temporary)   Call if you experience:  New onset weakness or numbness of an extremity below the injection site that last more than 8 hours. Hives or difficulty breathing ( go to the emergency room) Inflammation or drainage at the injection site Any new symptoms which are concerning to you  Please note:  Although the local anesthetic injected can often make your back/ hip/ buttock/ leg feel good for several hours after the injections, the pain will likely return.  It takes 3-7 days for steroids to work in the sacroiliac area.  You may not notice any pain relief for at least that one week.  If effective, we will often do a series of three injections spaced 3-6 weeks apart to maximally decrease your pain.  After the initial series, we generally will wait some months before a repeat injection of the same type.  If you have any questions, please call [phone] [facility]

## 2023-02-10 NOTE — Progress Notes (Signed)
PROVIDER NOTE: Information contained herein reflects review and annotations entered in association with encounter. Interpretation of such information and data should be left to medically-trained personnel. Information provided to patient can be located elsewhere in the medical record under "Patient Instructions". Document created using STT-dictation technology, any transcriptional errors that may result from process are unintentional.    Patient: Harold Sandoval  Service Category: E/M  Provider: Gillis Santa, MD  DOB: Aug 20, 1941  DOS: 02/10/2023  Referring Provider: Baxter Hire, MD  MRN: [id number]  Specialty: Interventional Pain Management  PCP: Baxter Hire, MD  Type: Established Patient  Setting: Ambulatory outpatient    Location: Office  Delivery: Face-to-face     HPI  [name], a 82 y.o. year old male, is here today because of his Primary osteoarthritis of left knee [M17.12]. Mr. Bucio primary complain today is Knee Pain (Left knee ), Back Pain (Lumbar right ), and Leg Pain (Left ) Last encounter: My last encounter with him was on 04/29/21 Pertinent problems: Mr. Morelan has Thoracic radiculopathy; Osteoarthritis of left hip; Primary osteoarthritis of left hip; Degenerative arthritis of left knee; Primary osteoarthritis of left knee; Acute CVA (cerebrovascular accident) [facility]); and Chronic pain syndrome on their pertinent problem list. Pain Assessment: Severity of Chronic pain is reported as a 5 /10. Location: Back (see visit info) Lower, Right/? back pain into right leg. Onset: More than a month ago. Quality: Discomfort, Constant, Sharp, Dull, Aching, Tingling. Timing: Constant. Modifying factor(s): rest. Vitals:  height is '6\' 4"'$  (1.93 m) and weight is 200 lb (90.7 kg). His temporal temperature is 98.1 F (36.7 C). His blood pressure is 143/71 (abnormal) and his pulse is 85. His respiration is 16 and oxygen saturation is 99%.   Reason for encounter: evaluation of worsening, or  previously known (established) problem.   -left knee pain related to knee osteoarthritis, previously had Left knee steroid injection 08/20/22 which provided 75% pain relief for approx 8 months -also having right lower back pain, SI joint and piriformis pain, he has a history of lumbar spinal fusion.    ROS  Constitutional: Denies any fever or chills Gastrointestinal: No reported hemesis, hematochezia, vomiting, or acute GI distress Musculoskeletal: Low back pain, right SI joint pain, left knee pain Neurological:  Paresthesias bilateral upper and lower extremity  Medication Review  Cholecalciferol, apixaban, atorvastatin, brimonidine, brinzolamide, desonide, fluticasone, gabapentin, latanoprost, loratadine, and metFORMIN  History Review  Allergy: Mr. Jaco is allergic to gadavist [gadobutrol] and lactose intolerance (gi). Drug: Mr. Kempen  reports no history of drug use. Alcohol:  reports no history of alcohol use. Tobacco:  reports that he has never smoked. He has never used smokeless tobacco. Social: Mr. Kolton  reports that he has never smoked. He has never used smokeless tobacco. He reports that he does not drink alcohol and does not use drugs. 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 Surgical: Mr. Tobar  has a past surgical history that includes Back surgery; Appendectomy; Colonoscopy with propofol (N/A, 12/29/2016); LOOP RECORDER INSERTION (N/A, 02/24/2017); Laparoscopic cholecystectomy; Joint replacement; Lumbar disc surgery; Cataract extraction, bilateral; Total hip arthroplasty (Left,  02/10/2018); Total knee arthroplasty (Left, 08/11/2018); and TEE without cardioversion (N/A, 12/21/2018). Family: family  history includes Cancer in his father and mother.  Laboratory Chemistry Profile   Renal Lab Results  Component Value Date   BUN 16 01/17/2022   CREATININE 0.79 01/17/2022   GFRAA >60 02/26/2019   GFRNONAA >60 01/17/2022    Hepatic Lab Results  Component Value Date   AST 23 01/17/2022   ALT 17 01/17/2022   ALBUMIN 4.0 01/17/2022   ALKPHOS 100 01/17/2022   AMMONIA 9 09/06/2018    Electrolytes Lab Results  Component Value Date   NA 138 01/17/2022   K 3.8 01/17/2022   CL 103 01/17/2022   CALCIUM 9.1 01/17/2022    Bone No results found for: "VD25OH", "VD125OH2TOT", "[id number]", "[id number]", "25OHVITD1", "25OHVITD2", "25OHVITD3", "TESTOFREE", "TESTOSTERONE"  Inflammation (CRP: Acute Phase) (ESR: Chronic Phase) No results found for: "CRP", "ESRSEDRATE", "LATICACIDVEN"       Note: Above Lab results reviewed.  Recent Imaging Review  DG PAIN CLINIC C-ARM 1-60 MIN NO REPORT Fluoro was used, but no Radiologist interpretation will be provided.  Please refer to "NOTES" tab for provider progress note.    MRI LUMBAR SPINE WITHOUT AND WITH CONTRAST   INDICATION: LBP w/ LLE radic with adjacen segement disease, prior c/f for  trauma inflammation vs infection, W19.XXXA Unspecified fall, initial  encounter   COMPARISON: MRI of the lumbar spine 01/27/2022   TECHNIQUE/PROTOCOL: Infection/bone metastasis protocol MRI of the lumbar  spine was performed pre and postcontrast administration.   FINDINGS:  Motion degraded study.   Alignment: Grade 1 anterolisthesis of L2 on L3. Remainder of the alignment  is anatomic.  Anatomical variants: Conventional spinal numbering  Conus medullaris: terminates at approximately L1.  Spinal Cord and Cauda Equina: Visualized portions of the spinal cord are  normal in morphology and signal. Normal appearance of the cauda equina.  Bone  marrow signal: No suspicious lesions.Acquired osseous fusion along the  anterior aspect of the intervertebral disc space at L5-S1. Chronic osseous  deformity of the left iliac bone is redemonstrated.  Sacroiliac joints: No significant degenerative changes of the visualized SI  joints   Status post posterior fusion L3-S1 is redemonstrated. Susceptibility  artifact related to hardware limits evaluation of the spinal canal.  Interval increase in dorsal epidural enhancing soft tissue when compared to  the MRI dated 01/27/2022 (series 10, image 7); no discrete collection  identified. This area of enhancement is contiguous with a fluid collection  along the L2-3 interspinous region, which has also increased in size.  Combination of degenerative facet changes and enhancing soft tissue along  the dorsal and ventral epidural space at the L2-3 level results in severe  spinal cord stenosis and compression of the cauda equina nerve roots. The  degree of stenosis is unchanged as compared to the prior exam.   Regional Soft Tissues: Small, nonenhancing T2 hyperintense lesion within  the left renal hilum, likely a parapelvic cyst. Right intramuscular  psoas  abscess has slightly increased in size and now measures up to 9 mm. Left  paraspinal musculature collection appears more organized measuring 6 x 5 mm  (series 3, image 33). (series 6, image 24).   No significant change in degenerative change from prior. Evaluation of the  neural foramina is limited secondary to artifact and patient motion.   IMPRESSION:  1. Overall slight increase in dorsal epidural phlegmon at the L2-3 level  and of the fluid within the interspinous region with resultant severe  spinal canal stenosis and compression of the cauda equina nerve roots.   2. Interval evolution/more  organized appearance of the right psoas and left  paraspinal musculature abscesses.     Electronically Reviewed by:  Edmund Hilda, MD, [facility]   Electronically Reviewed on:  03/12/2022 12:42 PM   I have reviewed the images and concur with the above findings.   Electronically Signed by:  Georga Bora, MD, [facility]  Electronically Signed on:  03/12/2022 1:31 PM Procedure Note  Georga Bora, MD - 03/12/2022  Formatting of this note might be different from the original.  MRI LUMBAR SPINE WITHOUT AND WITH CONTRAST   INDICATION: LBP w/ LLE radic with adjacen segement disease, prior c/f for  trauma inflammation vs infection, W19.XXXA Unspecified fall, initial  encounter   COMPARISON: MRI of the lumbar spine 01/27/2022   TECHNIQUE/PROTOCOL: Infection/bone metastasis protocol MRI of the lumbar  spine was performed pre and postcontrast administration.   FINDINGS:  Motion degraded study.   Alignment: Grade 1 anterolisthesis of L2 on L3. Remainder of the alignment  is anatomic.  Anatomical variants: Conventional spinal numbering  Conus medullaris: terminates at approximately L1.  Spinal Cord and Cauda Equina: Visualized portions of the spinal cord are  normal in morphology and signal. Normal appearance of the cauda equina.  Bone marrow signal: No suspicious lesions.Acquired osseous fusion along the  anterior aspect of the intervertebral disc space at L5-S1. Chronic osseous  deformity of the left iliac bone is redemonstrated.  Sacroiliac joints: No significant degenerative changes of the visualized SI  joints   Status post posterior fusion L3-S1 is redemonstrated. Susceptibility  artifact related to hardware limits evaluation of the spinal canal.  Interval increase in dorsal epidural enhancing soft tissue when compared to  the MRI dated 01/27/2022 (series 10, image 7); no discrete collection  identified. This area of enhancement is contiguous with a fluid collection  along the L2-3 interspinous region, which has also increased in size.  Combination of degenerative facet changes and enhancing soft tissue along  the dorsal  and ventral epidural space at the L2-3 level results in severe  spinal cord stenosis and compression of the cauda equina nerve roots. The  degree of stenosis is unchanged as compared to the prior exam.   Regional Soft Tissues: Small, nonenhancing T2 hyperintense lesion within  the left renal hilum, likely a parapelvic cyst. Right intramuscular  psoas  abscess has slightly increased in size and now measures up to 9 mm. Left  paraspinal musculature collection appears more organized measuring 6 x 5 mm  (series 3, image 33). (series 6, image 24).   No significant change in degenerative change from prior. Evaluation of the  neural foramina is limited secondary to artifact and patient motion.   IMPRESSION:  1. Overall slight increase in dorsal epidural phlegmon at the L2-3 level  and of the fluid within the interspinous region with resultant severe  spinal canal stenosis and compression of the cauda equina nerve roots.   2. Interval evolution/more organized appearance of the right psoas and left  paraspinal musculature abscesses.          Electronically Reviewed by:  Edmund Hilda, MD, [facility]  Electronically Reviewed on:  03/12/2022 12:42 PM    Note: Reviewed        Physical Exam  General appearance: Well nourished, well developed, and well hydrated. In no apparent acute distress Mental status: Alert, oriented x 3 (person, place, & time)       Respiratory: No evidence of acute respiratory distress Eyes: PERLA Vitals: BP (!) 143/71 (BP Location: Right  Arm, Patient Position: Sitting, Cuff Size: Normal)   Pulse 85   Temp 98.1 F (36.7 C) (Temporal)   Resp 16   Ht '6\' 4"'$  (1.93 m)   Wt 200 lb (90.7 kg)   SpO2 99%   BMI 24.34 kg/m  BMI: Estimated body mass index is 24.34 kg/m as calculated from the following:   Height as of this encounter: '6\' 4"'$  (1.93 m).   Weight as of this encounter: 200 lb (90.7 kg). Ideal: Ideal body weight: 86.8 kg (191 lb 5.7 oz) Adjusted ideal  body weight: 88.4 kg (194 lb 13 oz)    Lumbar Spine Area Exam  Skin & Axial Inspection: Well healed scar from previous spine surgery detected Alignment: Scoliosis detected Functional ROM: Pain restricted ROM       Stability: No instability detected Muscle Tone/Strength: Functionally intact. No obvious neuro-muscular anomalies detected. Sensory (Neurological): Musculoskeletal pain pattern right L5-S1 region Lumbar Spine Area Exam   Provocative Tests:  Patrick's Maneuver: (+) for right-sided S-I arthralgia             FABER* test: (+) for right-sided S-I arthralgia  S-I anterior distraction/compression test: (+) for right-sided S-I arthralgia  S-I lateral compression test: (+) for right-sided S-I arthralgia  S-I Thigh-thrust test: (+) for right-sided S-I arthralgia  S-I Gaenslen's test: (+) for right-sided S-I arthralgia    Gait & Posture Assessment  Ambulation: Unassisted Gait: Relatively normal for age and body habitus Posture: WNL   Lower Extremity Exam    Side: Right lower extremity  Side: Left lower extremity  Stability: No instability observed          Stability: No instability observed          Skin & Extremity Inspection: Evidence of prior arthroplastic surgery  Skin & Extremity Inspection: Skin color, temperature, and hair growth are WNL. No peripheral edema or cyanosis. No masses, redness, swelling, asymmetry, or associated skin lesions. No contractures.  Functional ROM: Pain restricted ROM for hip and knee joints          Functional ROM: Pain restricted ROM for hip and knee joints          Muscle Tone/Strength: Functionally intact. No obvious neuro-muscular anomalies detected.  Muscle Tone/Strength: Functionally intact. No obvious neuro-muscular anomalies detected.  Sensory (Neurological): Neurogenic pain pattern        Sensory (Neurological): Arthropathic arthralgia        DTR: Patellar: deferred today Achilles: deferred today Plantar: deferred today  DTR: Patellar:  deferred today Achilles: deferred today Plantar: deferred today  Palpation: No palpable anomalies  Palpation: No palpable anomalies    Assessment   Diagnosis Status  1. Primary osteoarthritis of left knee   2. Chronic right SI joint pain   3. Piriformis muscle pain   4. Myofascial pain syndrome of lumbar spine   5. Chronic pain syndrome     Persistent Persistent Persistent   Plan of Care   1. Primary osteoarthritis of left knee - KNEE INJECTION; Future  2. Chronic right SI joint pain - SACROILIAC JOINT INJECTION; Future  3. Piriformis muscle pain - TRIGGER POINT INJECTION; Future  4. Myofascial pain syndrome of lumbar spine - TRIGGER POINT INJECTION; Future  5. Chronic pain syndrome - KNEE INJECTION; Future - SACROILIAC JOINT INJECTION; Future - TRIGGER POINT INJECTION; Future   Orders:  Orders Placed This Encounter  Procedures   KNEE INJECTION    Local Anesthetic & Steroid injection.    Standing Status:   Future  Standing Expiration Date:   05/13/2023    Scheduling Instructions:     Side: Left     Timeframe: As soon as schedule allows    Order Specific Question:   Where will this procedure be performed?    Answer:   [facility]   SACROILIAC JOINT INJECTION    Standing Status:   Future    Standing Expiration Date:   05/13/2023    Scheduling Instructions:     Right SI-joint injection    Order Specific Question:   Where will this procedure be performed?    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 CPT code: 20552    Standing Status:   Future    Standing Expiration Date:   02/10/2024    Scheduling Instructions:     Right piriformis TPI    Order Specific Question:   Where will this procedure be performed?    Answer:   [facility]   Follow-up plan:   Return in about 1 week  (around 02/17/2023) for Left knee steroid, right SI-J, lumbar and piriformis TPI.    Recent Visits No visits were found meeting these conditions. Showing recent visits within past 90 days and meeting all other requirements Today's Visits Date Type Provider Dept  02/10/23 Office Visit Gillis Santa, MD [facility]  Showing today's visits and meeting all other requirements Future Appointments No visits were found meeting these conditions. Showing future appointments within next 90 days and meeting all other requirements  I discussed the assessment and treatment plan with the patient. The patient was provided an opportunity to ask questions and all were answered. The patient agreed with the plan and demonstrated an understanding of the instructions.  Patient advised to call back or seek an in-person evaluation if the symptoms or condition worsens.  Duration of encounter: 30 minutes.  Total time on encounter, as per AMA guidelines included both the face-to-face and non-face-to-face time personally spent by the physician and/or other qualified health care professional(s) on the day of the encounter (includes time in activities that require the physician or other qualified health care professional and does not include time in activities normally performed by clinical staff). Physician's time may include the following activities when performed: preparing to see the patient (eg, review of tests, pre-charting review of records) obtaining and/or reviewing separately obtained history performing a medically appropriate examination and/or evaluation counseling and educating the patient/family/caregiver ordering medications, tests, or procedures referring and communicating with other health care professionals (when not separately reported) documenting clinical information in the electronic or other health record independently interpreting results (not separately reported) and communicating  results to the patient/ family/caregiver care coordination (not separately reported)  Note by: Gillis Santa, MD Date: 02/10/2023; Time: 10:40 AM

## 2023-02-10 NOTE — Progress Notes (Signed)
Safety precautions to be maintained throughout the outpatient stay will include: orient to surroundings, keep bed in low position, maintain call bell within reach at all times, provide assistance with transfer out of bed and ambulation.  

## 2023-03-04 ENCOUNTER — Encounter: Payer: Self-pay | Admitting: Student in an Organized Health Care Education/Training Program

## 2023-03-04 ENCOUNTER — Ambulatory Visit
Payer: Medicare HMO | Attending: Student in an Organized Health Care Education/Training Program | Admitting: Student in an Organized Health Care Education/Training Program

## 2023-03-04 ENCOUNTER — Ambulatory Visit
Admission: RE | Admit: 2023-03-04 | Discharge: 2023-03-04 | Disposition: A | Discharge status: Home / Self Care | Payer: Medicare HMO | Source: Ambulatory Visit | Attending: Student in an Organized Health Care Education/Training Program | Admitting: Student in an Organized Health Care Education/Training Program

## 2023-03-04 VITALS — BP 152/95 | HR 90 | Temp 98.4°F | Resp 16 | Ht 76.0 in | Wt 187.0 lb

## 2023-03-04 DIAGNOSIS — M1712 Unilateral primary osteoarthritis, left knee: Secondary | ICD-10-CM

## 2023-03-04 DIAGNOSIS — M545 Low back pain, unspecified: Secondary | ICD-10-CM | POA: Insufficient documentation

## 2023-03-04 DIAGNOSIS — M1711 Unilateral primary osteoarthritis, right knee: Secondary | ICD-10-CM | POA: Insufficient documentation

## 2023-03-04 DIAGNOSIS — G8929 Other chronic pain: Secondary | ICD-10-CM

## 2023-03-04 DIAGNOSIS — G894 Chronic pain syndrome: Secondary | ICD-10-CM | POA: Diagnosis not present

## 2023-03-04 DIAGNOSIS — M533 Sacrococcygeal disorders, not elsewhere classified: Secondary | ICD-10-CM | POA: Diagnosis not present

## 2023-03-04 DIAGNOSIS — G5701 Lesion of sciatic nerve, right lower limb: Secondary | ICD-10-CM | POA: Diagnosis not present

## 2023-03-04 MED ORDER — IOHEXOL 180 MG/ML  SOLN
10.0000 mL | Freq: Once | INTRAMUSCULAR | Status: AC
  Administered 2023-03-04: 10 mL via INTRA_ARTICULAR
  Filled 2023-03-04: qty 20

## 2023-03-04 MED ORDER — METHYLPREDNISOLONE ACETATE 40 MG/ML IJ SUSP
INTRAMUSCULAR | Status: AC
  Filled 2023-03-04: qty 1

## 2023-03-04 MED ORDER — DEXAMETHASONE SODIUM PHOSPHATE 10 MG/ML IJ SOLN
10.0000 mg | Freq: Once | INTRAMUSCULAR | Status: AC
  Administered 2023-03-04: 10 mg
  Filled 2023-03-04: qty 1

## 2023-03-04 MED ORDER — ROPIVACAINE HCL 2 MG/ML IJ SOLN
9.0000 mL | Freq: Once | INTRAMUSCULAR | Status: AC
  Administered 2023-03-04: 9 mL via PERINEURAL
  Filled 2023-03-04: qty 20

## 2023-03-04 MED ORDER — ROPIVACAINE HCL 2 MG/ML IJ SOLN
INTRAMUSCULAR | Status: AC
  Filled 2023-03-04: qty 20

## 2023-03-04 MED ORDER — METHYLPREDNISOLONE ACETATE 40 MG/ML IJ SUSP
40.0000 mg | Freq: Once | INTRAMUSCULAR | Status: AC
  Administered 2023-03-04: 40 mg

## 2023-03-04 MED ORDER — LIDOCAINE HCL 2 % IJ SOLN
20.0000 mL | Freq: Once | INTRAMUSCULAR | Status: AC
  Administered 2023-03-04: 100 mg
  Filled 2023-03-04: qty 40

## 2023-03-04 MED ORDER — LIDOCAINE HCL (PF) 2 % IJ SOLN
INTRAMUSCULAR | Status: AC
  Filled 2023-03-04: qty 5

## 2023-03-04 NOTE — Progress Notes (Signed)
PROVIDER NOTE: Interpretation of information contained herein should be left to medically-trained personnel. Specific patient instructions are provided elsewhere under "Patient Instructions" section of medical record. This document was created in part using STT-dictation technology, any transcriptional errors that may result from this process are unintentional.  Patient: Harold Sandoval Type: Established DOB: 1941/11/19 MRN: [id number] PCP: Baxter Hire, MD  Service: Procedure DOS: 03/04/2023 Setting: Ambulatory Location: Ambulatory outpatient facility Delivery: Face-to-face Provider: Gillis Santa, MD Specialty: Interventional Pain Management Specialty designation: 09 Location: Outpatient facility Ref. Prov.: Gillis Santa, MD       Interventional Therapy   Procedure: Sacroiliac Joint Steroid Injection #1  & Right Piriformis TPI Laterality: Right     Level: PSIS (Posterior inferior iliac Spine)  Imaging: Fluoroscopic guidance Anesthesia: Local anesthesia (1-2% Lidocaine)  DOS: 03/04/2023  Performed by: Gillis Santa, MD  Purpose: Diagnostic/Therapeutic Indications: Sacroiliac joint pain in the lower back and hip area severe enough to impact quality of life or function. Rationale (medical necessity): procedure needed and proper for the diagnosis and/or treatment of Harold Sandoval medical symptoms and needs. 1. Chronic right SI joint pain   2. Chronic pain syndrome    NAS-11 Pain score:   Pre-procedure: 6 /10   Post-procedure: 6 /10     Target: Interarticular sacroiliac joint. Location: Medial to the postero-medial edge of iliac spine. Region: Lumbosacral-sacrococcygeal. Approach: Inferior postero-medial percutaneous approach. Type of procedure: Percutaneous joint injection.  Position / Prep / Materials:  Position: Prone  Prep solution: DuraPrep (Iodine Povacrylex [0.7% available iodine] and Isopropyl Alcohol, 74% w/w) Prep Area: Entire posterior lumbosacral area   Materials:  Tray: Block Needle(s):  Type: Spinal  Gauge (G): 22  Length: 3.5-in Qty: 1  Pre-op H&P Assessment:  Harold Sandoval is a 82 y.o. (year old), male patient, seen today for interventional treatment. 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 Harold Sandoval has a current medication list which includes the following prescription(s): apixaban, atorvastatin, brimonidine, brinzolamide, vitamin d3, claritin, fluticasone, gabapentin, latanoprost, metformin, and desonide. His primarily concern today is the Back Pain (Lower back and left knee)  Initial Vital Signs:  Pulse/HCG Rate: 90ECG Heart Rate: 98 Temp: 98.4 F (36.9 C) Resp: 18 BP: (!) 148/85 SpO2: 100 %  BMI: Estimated body mass index is 22.76 kg/m as calculated from the following:   Height as of this encounter: 6\' 4"  (1.93 m).   Weight as of this encounter: 187 lb (84.8 kg).  Risk Assessment: Allergies: Reviewed. He is allergic to gadavist [gadobutrol] and lactose intolerance (gi).  Allergy Precautions: None required Coagulopathies: Reviewed. None identified.  Blood-thinner therapy: None at this time Active Infection(s): Reviewed. None identified. Harold Sandoval is afebrile  Site Confirmation: Harold Sandoval was asked to confirm the procedure and laterality before marking the site Procedure checklist: Completed Consent: Before the procedure and under the influence of no sedative(s), amnesic(s), or anxiolytics, the patient was informed of the treatment options, risks and possible complications. To fulfill our ethical and legal obligations, as recommended by the American Medical Association's  Code of Ethics, I have informed the patient of my clinical impression; the nature and purpose of the treatment or procedure; the risks, benefits, and possible complications of the intervention; the alternatives, including doing nothing; the risk(s) and benefit(s) of  the alternative treatment(s) or procedure(s); and the risk(s) and benefit(s) of doing nothing. The patient was provided information about the general risks and possible complications associated with the procedure. These may include, but are not limited to: failure to achieve desired goals, infection, bleeding, organ or nerve damage, allergic reactions, paralysis, and death. In addition, the patient was informed of those risks and complications associated to the procedure, such as failure to decrease pain; infection; bleeding; organ or nerve damage with subsequent damage to sensory, motor, and/or autonomic systems, resulting in permanent pain, numbness, and/or weakness of one or several areas of the body; allergic reactions; (i.e.: anaphylactic reaction); and/or death. Furthermore, the patient was informed of those risks and complications associated with the medications. These include, but are not limited to: allergic reactions (i.e.: anaphylactic or anaphylactoid reaction(s)); adrenal axis suppression; blood sugar elevation that in diabetics may result in ketoacidosis or comma; water retention that in patients with history of congestive heart failure may result in shortness of breath, pulmonary edema, and decompensation with resultant heart failure; weight gain; swelling or edema; medication-induced neural toxicity; particulate matter embolism and blood vessel occlusion with resultant organ, and/or nervous system infarction; and/or aseptic necrosis of one or more joints. Finally, the patient was informed that Medicine is not an exact science; therefore, there is also the possibility of unforeseen or unpredictable risks and/or possible complications  that may result in a catastrophic outcome. The patient indicated having understood very clearly. We have given the patient no guarantees and we have made no promises. Enough time was given to the patient to ask questions, all of which were answered to the patient's satisfaction. Mr. Handyside has indicated that he wanted to continue with the procedure. Attestation: I, the ordering provider, attest that I have discussed with the patient the benefits, risks, side-effects, alternatives, likelihood of achieving goals, and potential problems during recovery for the procedure that I have provided informed consent. Date  Time: 03/04/2023  8:55 AM  Pre-Procedure Preparation:  Monitoring: As per clinic protocol. Respiration, ETCO2, SpO2, BP, heart rate and rhythm monitor placed and checked for adequate function Safety Precautions: Patient was assessed for positional comfort and pressure points before starting the procedure. Time-out: I initiated and conducted the "Time-out" before starting the procedure, as per protocol. The patient was asked to participate by confirming the accuracy of the "Time Out" information. Verification of the correct person, site, and procedure were performed and confirmed by me, the nursing staff, and the patient. "Time-out" conducted as per Joint Commission's Universal Protocol (UP.01.01.01). Time: 0957 Start Time: 0957 hrs.  Description/Narrative of Procedure:          Start Time: 0957 hrs.  Rationale (medical necessity): procedure needed and proper for the diagnosis and/or treatment of the patient's medical symptoms and needs. Procedural Technique Safety Precautions: Aspiration looking for blood return was conducted prior to all injections. At no point did we inject any substances, as a needle was being advanced. No attempts were made at seeking any paresthesias. Safe injection practices and needle disposal techniques used. Medications properly checked for expiration dates. SDV  (single dose vial) medications used. Description of the Procedure: Protocol guidelines were followed. The patient was assisted into a comfortable position. The target area was identified and the area prepped in the usual manner. Skin & deeper tissues infiltrated with local anesthetic. Appropriate amount of time allowed to pass for local anesthetics to take effect. The procedure needles were then advanced to the target area. Proper needle placement secured. Negative  aspiration confirmed. Solution injected in intermittent fashion, asking for systemic symptoms every 0.5cc of injectate. The needles were then removed and the area cleansed, making sure to leave some of the prepping solution back to take advantage of its long term bactericidal properties.  Technical description of procedure:  Fluoroscopy using a posterior anterior 45 degree angle from the midline aiming at the anterolateral aspect of the patient was used to find a direct path into the sacroiliac joint, the superior medial to posterior superior iliac spine.  The skin was marked where the desired target and the skin infiltrated with local anesthetics.  The procedure needle was then advanced until the joint was entered.  Once inside of the joint, we then proceeded to inject the desired solution.  4.5  cc solution made of 4 cc of 0.2% ropivacaine, half a cc of Decadron (10 mg/cc.)  Injected along the right SI joint  Afterwards a right piriformis trigger point injection was done 1 cm inferior, 1 cm deep, 1 cm lateral to the inferior fissure of the SI joint.  Contrast was injected to confirm piriformis muscle striation.   4.5  cc solution made of 4 cc of 0.2% ropivacaine, half a cc of Decadron (10 mg/cc.)  Injected along the right piriformis. While injecting, patient did not complain of any pain radiating down his leg.    Vitals:   03/04/23 0948 03/04/23 0953 03/04/23 0958 03/04/23 1002  BP: (!) 175/106 (!) 172/84 (!) 146/90 (!) 152/95  Pulse:       Resp: 18 16 18 16   Temp:      TempSrc:      SpO2: 100% 99% 99% 98%  Weight:      Height:         End Time: 1002 hrs.  Imaging Guidance (Non-Spinal):          Type of Imaging Technique: Fluoroscopy Guidance (Non-Spinal) Indication(s): Assistance in needle guidance and placement for procedures requiring needle placement in or near specific anatomical locations not easily accessible without such assistance. Exposure Time: Please see nurses notes. Contrast: Before injecting any contrast, we confirmed that the patient did not have an allergy to iodine, shellfish, or radiological contrast. Once satisfactory needle placement was completed at the desired level, radiological contrast was injected. Contrast injected under live fluoroscopy. No contrast complications. See chart for type and volume of contrast used. Fluoroscopic Guidance: I was personally present during the use of fluoroscopy. "Tunnel Vision Technique" used to obtain the best possible view of the target area. Parallax error corrected before commencing the procedure. "Direction-depth-direction" technique used to introduce the needle under continuous pulsed fluoroscopy. Once target was reached, antero-posterior, oblique, and lateral fluoroscopic projection used confirm needle placement in all planes. Images permanently stored in EMR. Interpretation: I personally interpreted the imaging intraoperatively. Adequate needle placement confirmed in multiple planes. Appropriate spread of contrast into desired area was observed. No evidence of afferent or efferent intravascular uptake. Permanent images saved into the patient's record.  Post-operative Assessment:  Post-procedure Vital Signs:  Pulse/HCG Rate: 9094 Temp: 98.4 F (36.9 C) Resp: 16 BP: (!) 152/95 SpO2: 98 %  EBL: None  Complications: No immediate post-treatment complications observed by team, or reported by patient.  Note: The patient tolerated the entire procedure well. A  repeat set of vitals were taken after the procedure and the patient was kept under observation following institutional policy, for this type of procedure. Post-procedural neurological assessment was performed, showing return to baseline, prior to discharge. The patient was  provided with post-procedure discharge instructions, including a section on how to identify potential problems. Should any problems arise concerning this procedure, the patient was given instructions to immediately contact us, at any time, without hesitation. In any case, we plan to contact the patient by telephone for a follow-up status report regarding this interventional procedure.  Comments:  No additional relevant information.  Plan of Care (POC)  Orders:  Orders Placed This Encounter  Procedures   DG PAIN [facility] C-ARM 1-60 MIN NO REPORT    Intraoperative interpretation by procedural physician at [place].    Standing Status:   Standing    Number of Occurrences:   1    Order Specific Question:   Reason for exam:    Answer:   Assistance in needle guidance and placement for procedures requiring needle placement in or near specific anatomical locations not easily accessible without such assistance.     Medications ordered for procedure: Meds ordered this encounter  Medications   iohexol (OMNIPAQUE) 180 MG/ML injection 10 mL    Must be Myelogram-compatible. If not available, you may substitute with a water-soluble, non-ionic, hypoallergenic, myelogram-compatible radiological contrast medium.   lidocaine (XYLOCAINE) 2 % (with pres) injection 400 mg   methylPREDNISolone acetate (DEPO-MEDROL) injection 40 mg   dexamethasone (DECADRON) injection 10 mg   ropivacaine (PF) 2 mg/mL (0.2%) (NAROPIN) injection 9 mL   Medications administered: We administered iohexol, lidocaine, methylPREDNISolone acetate, dexamethasone, and ropivacaine (PF) 2 mg/mL (0.2%).  See the medical record for exact dosing, route, and time  of administration.  Follow-up plan:   Return in about 6 weeks (around 04/15/2023) for Post Procedure Evaluation, in person.       Recent Visits Date Type Provider Dept  02/10/23 Office Visit Gillis Santa, MD [facility]  Showing recent visits within past 90 days and meeting all other requirements Today's Visits Date Type Provider Dept  03/04/23 Procedure visit Gillis Santa, MD [facility]  Showing today's visits and meeting all other requirements Future Appointments Date Type Provider Dept  04/14/23 Appointment Gillis Santa, MD [facility]  Showing future appointments within next 90 days and meeting all other requirements  Disposition: Discharge home  Discharge (Date  Time): 03/04/2023; 1012 hrs.   Primary Care Physician: Baxter Hire, MD Location: [facility] Note by: Gillis Santa, MD (TTS technology used. I apologize for any typographical errors that were not detected and corrected.) Date: 03/04/2023; Time: 3:41 PM  Disclaimer:  Medicine is not an Chief Strategy Officer. The only guarantee in medicine is that nothing is guaranteed. It is important to note that the decision to proceed with this intervention was based on the information collected from the patient. The Data and conclusions were drawn from the patient's questionnaire, the interview, and the physical examination. Because the information was provided in large part by the patient, it cannot be guaranteed that it has not been purposely or unconsciously manipulated. Every effort has been made to obtain as much relevant data as possible for this evaluation. It is important to note that the conclusions that lead to this procedure are derived in large part from the available data. Always take into account that the treatment will also be dependent on availability of resources and existing treatment guidelines, considered by other Pain Management Practitioners as being common  knowledge and practice, at the time of the intervention. For Medico-Legal purposes, it is also important to point out that variation in procedural techniques and pharmacological choices  are the acceptable norm. The indications, contraindications, technique, and results of the above procedure should only be interpreted and judged by a Board-Certified Interventional Pain Specialist with extensive familiarity and expertise in the same exact procedure and technique.

## 2023-03-04 NOTE — Progress Notes (Signed)
PROVIDER NOTE: Information contained herein reflects review and annotations entered in association with encounter. Interpretation of such information and data should be left to medically-trained personnel. Information provided to patient can be located elsewhere in the medical record under "Patient Instructions". Document created using STT-dictation technology, any transcriptional errors that may result from process are unintentional.    Patient: Harold Sandoval  Service Category: Procedure  Provider: Gillis Santa, MD  DOB: 29-Jun-1941  DOS: 03/04/2023  Location: [facility]  MRN: [id number]  Setting: Ambulatory - outpatient  Referring Provider: Gillis Santa, MD  Type: Established Patient  Specialty: Interventional Pain Management  PCP: Baxter Hire, MD   Primary Reason for Visit: Interventional Pain Management Treatment. CC: Back Pain (Lower back and left knee)  Procedure:          Anesthesia, Analgesia, Anxiolysis:  Type: Therapeutic Intra-Articular Local anesthetic and steroid Knee Injection #2  Region: Medial infrapatellar Knee Region Level: Knee Joint Laterality: Left knee  Type: Local Anesthesia Indication(s): Analgesia         Local Anesthetic: Lidocaine 1-2% Route: Infiltration ([place]/IM) IV Access: Declined Sedation: Declined   Position: Sitting   Indications: RIGHT KNEE OA  Pain Score: Pre-procedure: 6 /10 Post-procedure: 6 /10   Pre-op H&P Assessment:  Mr. Nerison is a 82 y.o. (year old), male patient, seen today for interventional treatment. 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 Mr. Eno has a current  medication list which includes the following prescription(s): apixaban, atorvastatin, brimonidine, brinzolamide, vitamin d3, claritin, fluticasone, gabapentin, latanoprost, metformin, and desonide. His primarily concern today is the Back Pain (Lower back and left knee)  Initial Vital Signs:  Pulse/HCG Rate: 90ECG Heart Rate: 98 Temp: 98.4 F (36.9 C) Resp: 18 BP: (!) 148/85 SpO2: 100 %  BMI: Estimated body mass index is 22.76 kg/m as calculated from the following:   Height as of this encounter: 6\' 4"  (1.93 m).   Weight as of this encounter: 187 lb (84.8 kg).  Risk Assessment: Allergies: Reviewed. He is allergic to gadavist [gadobutrol] and lactose intolerance (gi).  Allergy Precautions: None required Coagulopathies: Reviewed. None identified.  Blood-thinner therapy: None at this time Active Infection(s): Reviewed. None identified. Mr. Brookman is afebrile  Site Confirmation: [name] was asked to confirm the procedure and laterality before marking the site Procedure checklist: Completed Consent: Before the procedure and under the influence of no sedative(s), amnesic(s), or anxiolytics, the patient was informed of the treatment options, risks and possible complications. To fulfill our ethical and legal obligations, as recommended by the American Medical Association's Code of Ethics, I have informed the patient of my clinical impression; the nature and purpose of the treatment or procedure; the risks, benefits, and possible complications of the intervention; the alternatives, including doing nothing; the risk(s) and benefit(s) of the alternative treatment(s) or procedure(s); and the risk(s) and benefit(s) of doing nothing. The patient was provided information about the general risks and possible complications associated with the procedure. These may include, but are not limited to: failure to achieve desired goals, infection, bleeding, organ or nerve damage, allergic reactions, paralysis,  and death. In addition, the patient was informed of those risks and complications associated to the procedure, such as failure to decrease pain; infection; bleeding; organ or  nerve damage with subsequent damage to sensory, motor, and/or autonomic systems, resulting in permanent pain, numbness, and/or weakness of one or several areas of the body; allergic reactions; (i.e.: anaphylactic reaction); and/or death. Furthermore, the patient was informed of those risks and complications associated with the medications. These include, but are not limited to: allergic reactions (i.e.: anaphylactic or anaphylactoid reaction(s)); adrenal axis suppression; blood sugar elevation that in diabetics may result in ketoacidosis or comma; water retention that in patients with history of congestive heart failure may result in shortness of breath, pulmonary edema, and decompensation with resultant heart failure; weight gain; swelling or edema; medication-induced neural toxicity; particulate matter embolism and blood vessel occlusion with resultant organ, and/or nervous system infarction; and/or aseptic necrosis of one or more joints. Finally, the patient was informed that Medicine is not an exact science; therefore, there is also the possibility of unforeseen or unpredictable risks and/or possible complications that may result in a catastrophic outcome. The patient indicated having understood very clearly. We have given the patient no guarantees and we have made no promises. Enough time was given to the patient to ask questions, all of which were answered to the patient's satisfaction. Mr. Regenold has indicated that he wanted to continue with the procedure. Attestation: I, the ordering provider, attest that I have discussed with the patient the benefits, risks, side-effects, alternatives, likelihood of achieving goals, and potential problems during recovery for the procedure that I have provided informed consent. Date  Time:  03/04/2023  8:55 AM  Pre-Procedure Preparation:  Monitoring: As per clinic protocol. Respiration, ETCO2, SpO2, BP, heart rate and rhythm monitor placed and checked for adequate function Safety Precautions: Patient was assessed for positional comfort and pressure points before starting the procedure. Time-out: I initiated and conducted the "Time-out" before starting the procedure, as per protocol. The patient was asked to participate by confirming the accuracy of the "Time Out" information. Verification of the correct person, site, and procedure were performed and confirmed by me, the nursing staff, and the patient. "Time-out" conducted as per Joint Commission's Universal Protocol (UP.01.01.01). Time: 0957  Description of Procedure:          Target Area: Knee Joint Approach: Just above the Medial tibial plateau, lateral to the infrapatellar tendon. Area Prepped: Entire knee area, from the mid-thigh to the mid-shin. DuraPrep (Iodine Povacrylex [0.7% available iodine] and Isopropyl Alcohol, 74% w/w) Safety Precautions: Aspiration looking for blood return was conducted prior to all injections. At no point did we inject any substances, as a needle was being advanced. No attempts were made at seeking any paresthesias. Safe injection practices and needle disposal techniques used. Medications properly checked for expiration dates. SDV (single dose vial) medications used. Description of the Procedure: Protocol guidelines were followed. The patient was placed in position over the fluoroscopy table. The target area was identified and the area prepped in the usual manner. Skin & deeper tissues infiltrated with local anesthetic. Appropriate amount of time allowed to pass for local anesthetics to take effect. The procedure needles were then advanced to the target area. Proper needle placement secured. Negative aspiration confirmed. Solution injected in intermittent fashion, asking for systemic symptoms every 0.5cc of  injectate. The needles were then removed and the area cleansed, making sure to leave some of the prepping solution back to take advantage of its long term bactericidal properties. Vitals:   03/04/23 0948 03/04/23 0953 03/04/23 0958 03/04/23 1002  BP: (!) 175/106 (!) 172/84 (!) 146/90 (!) 152/95  Pulse:  Resp: 18 16 18 16   Temp:      TempSrc:      SpO2: 100% 99% 99% 98%  Weight:      Height:        Start Time: 0957 hrs. End Time: 1002 hrs. Materials:  Needle(s) Type: Regular needle Gauge: 25G Length: 1.5-in Medication(s): Please see orders for medications and dosing details. 5 cc solution made of 4 cc of 0.2% ropivacaine, 1 cc of methylprednisolone, 40 mg/cc.  Post-operative Assessment:  Post-procedure Vital Signs:  Pulse/HCG Rate: 9094 Temp: 98.4 F (36.9 C) Resp: 16 BP: (!) 152/95 SpO2: 98 %  EBL: None  Complications: No immediate post-treatment complications observed by team, or reported by patient.  Note: The patient tolerated the entire procedure well. A repeat set of vitals were taken after the procedure and the patient was kept under observation following institutional policy, for this type of procedure. Post-procedural neurological assessment was performed, showing return to baseline, prior to discharge. The patient was provided with post-procedure discharge instructions, including a section on how to identify potential problems. Should any problems arise concerning this procedure, the patient was given instructions to immediately contact us, at any time, without hesitation. In any case, we plan to contact the patient by telephone for a follow-up status report regarding this interventional procedure.  Comments:  No additional relevant information.  Plan of Care   Medications ordered for procedure: Meds ordered this encounter  Medications   iohexol (OMNIPAQUE) 180 MG/ML injection 10 mL    Must be Myelogram-compatible. If not available, you may substitute with a  water-soluble, non-ionic, hypoallergenic, myelogram-compatible radiological contrast medium.   lidocaine (XYLOCAINE) 2 % (with pres) injection 400 mg   methylPREDNISolone acetate (DEPO-MEDROL) injection 40 mg   dexamethasone (DECADRON) injection 10 mg   ropivacaine (PF) 2 mg/mL (0.2%) (NAROPIN) injection 9 mL   Medications administered: We administered iohexol, lidocaine, methylPREDNISolone acetate, dexamethasone, and ropivacaine (PF) 2 mg/mL (0.2%).  See the medical record for exact dosing, route, and time of administration.  Follow-up plan:   Return in about 6 weeks (around 04/15/2023) for Post Procedure Evaluation, in person.    Recent Visits Date Type Provider Dept  02/10/23 Office Visit Gillis Santa, MD [facility]  Showing recent visits within past 90 days and meeting all other requirements Today's Visits Date Type Provider Dept  03/04/23 Procedure visit Gillis Santa, MD [facility]  Showing today's visits and meeting all other requirements Future Appointments Date Type Provider Dept  04/14/23 Appointment Gillis Santa, MD [facility]  Showing future appointments within next 90 days and meeting all other requirements  Disposition: Discharge home  Discharge (Date  Time): 03/04/2023; 1012 hrs.   Primary Care Physician: Baxter Hire, MD Location: [facility] Note by: Gillis Santa, MD Date: 03/04/2023; Time: 3:34 PM  Disclaimer:  Medicine is not an exact science. The only guarantee in medicine is that nothing is guaranteed. It is important to note that the decision to proceed with this intervention was based on the information collected from the patient. The Data and conclusions were drawn from the patient's questionnaire, the interview, and the physical examination. Because the information was provided in large part by the patient, it cannot be guaranteed that it has not been purposely or unconsciously manipulated.  Every effort has been made to obtain as much relevant data as possible for this evaluation. It is important to note that the conclusions that lead to this procedure are derived in  large part from the available data. Always take into account that the treatment will also be dependent on availability of resources and existing treatment guidelines, considered by other Pain Management Practitioners as being common knowledge and practice, at the time of the intervention. For Medico-Legal purposes, it is also important to point out that variation in procedural techniques and pharmacological choices are the acceptable norm. The indications, contraindications, technique, and results of the above procedure should only be interpreted and judged by a Board-Certified Interventional Pain Specialist with extensive familiarity and expertise in the same exact procedure and technique.

## 2023-03-04 NOTE — Patient Instructions (Signed)
Pain Management Discharge Instructions  General Discharge Instructions :  If you need to reach your doctor call: Monday-Friday 8:00 am - 4:00 pm at [phone] or toll free [phone].  After clinic hours [phone] to have operator reach doctor.  Bring all of your medication bottles to all your appointments in the pain clinic.  To cancel or reschedule your appointment with Pain Management please remember to call 24 hours in advance to avoid a fee.  Refer to the educational materials which you have been given on: General Risks, I had my Procedure. Discharge Instructions, Post Sedation.  Post Procedure Instructions:  The drugs you were given will stay in your system until tomorrow, so for the next 24 hours you should not drive, make any legal decisions or drink any alcoholic beverages.  You may eat anything you prefer, but it is better to start with liquids then soups and crackers, and gradually work up to solid foods.  Please notify your doctor immediately if you have any unusual bleeding, trouble breathing or pain that is not related to your normal pain.  Depending on the type of procedure that was done, some parts of your body may feel week and/or numb.  This usually clears up by tonight or the next day.  Walk with the use of an assistive device or accompanied by an adult for the 24 hours.  You may use ice on the affected area for the first 24 hours.  Put ice in a Ziploc bag and cover with a towel and place against area 15 minutes on 15 minutes off.  You may switch to heat after 24 hours.Sacroiliac (SI) Joint Injection Patient Information  Description: The sacroiliac joint connects the scrum (very low back and tailbone) to the ilium (a pelvic bone which also forms half of the hip joint).  Normally this joint experiences very little motion.  When this joint becomes inflamed or unstable low back and or hip and pelvis pain may result.  Injection of this joint with local anesthetics  (numbing medicines) and steroids can provide diagnostic information and reduce pain.  This injection is performed with the aid of x-ray guidance into the tailbone area while you are lying on your stomach.   You may experience an electrical sensation down the leg while this is being done.  You may also experience numbness.  We also may ask if we are reproducing your normal pain during the injection.  Conditions which may be treated SI injection:  Low back, buttock, hip or leg pain  Preparation for the Injection:  Do not eat any solid food or dairy products within 8 hours of your appointment.  You may drink clear liquids up to 3 hours before appointment.  Clear liquids include water, black coffee, juice or soda.  No milk or cream please. You may take your regular medications, including pain medications with a sip of water before your appointment.  Diabetics should hold regular insulin (if take separately) and take 1/2 normal NPH dose the morning of the procedure.  Carry some sugar containing items with you to your appointment. A driver must accompany you and be prepared to drive you home after your procedure. Bring all of your current medications with you. An IV may be inserted and sedation may be given at the discretion of the physician. A blood pressure cuff, EKG and other monitors will often be applied during the procedure.  Some patients may need to have extra oxygen administered for a short period.  You will   be asked to provide medical information, including your allergies, prior to the procedure.  We must know immediately if you are taking blood thinners (like Coumadin/Warfarin) or if you are allergic to IV iodine contrast (dye).  We must know if you could possible be pregnant.  Possible side effects:  Bleeding from needle site Infection (rare, may require surgery) Nerve injury (rare) Numbness & tingling (temporary) A brief convulsion or seizure Light-headedness (temporary) Pain at  injection site (several days) Decreased blood pressure (temporary) Weakness in the leg (temporary)   Call if you experience:  New onset weakness or numbness of an extremity below the injection site that last more than 8 hours. Hives or difficulty breathing ( go to the emergency room) Inflammation or drainage at the injection site Any new symptoms which are concerning to you  Please note:  Although the local anesthetic injected can often make your back/ hip/ buttock/ leg feel good for several hours after the injections, the pain will likely return.  It takes 3-7 days for steroids to work in the sacroiliac area.  You may not notice any pain relief for at least that one week.  If effective, we will often do a series of three injections spaced 3-6 weeks apart to maximally decrease your pain.  After the initial series, we generally will wait some months before a repeat injection of the same type.  If you have any questions, please call [phone] [facility]   

## 2023-03-04 NOTE — Progress Notes (Signed)
Safety precautions to be maintained throughout the outpatient stay will include: orient to surroundings, keep bed in low position, maintain call bell within reach at all times, provide assistance with transfer out of bed and ambulation.  

## 2023-03-05 ENCOUNTER — Telehealth: Payer: Self-pay | Admitting: *Deleted

## 2023-03-05 NOTE — Telephone Encounter (Signed)
No problems post procedure. 

## 2023-03-05 NOTE — Telephone Encounter (Signed)
Attempted to call for post procedure follow-up. Someone answered the phone, but they could not hear me. No communication was made.

## 2023-04-14 ENCOUNTER — Encounter: Payer: Self-pay | Admitting: Student in an Organized Health Care Education/Training Program

## 2023-04-14 ENCOUNTER — Ambulatory Visit
Payer: Medicare HMO | Attending: Student in an Organized Health Care Education/Training Program | Admitting: Student in an Organized Health Care Education/Training Program

## 2023-04-14 VITALS — BP 116/78 | HR 99 | Temp 98.3°F | Resp 16 | Ht 76.0 in | Wt 194.0 lb

## 2023-04-14 DIAGNOSIS — M533 Sacrococcygeal disorders, not elsewhere classified: Secondary | ICD-10-CM | POA: Insufficient documentation

## 2023-04-14 DIAGNOSIS — G8929 Other chronic pain: Secondary | ICD-10-CM | POA: Insufficient documentation

## 2023-04-14 DIAGNOSIS — G894 Chronic pain syndrome: Secondary | ICD-10-CM | POA: Insufficient documentation

## 2023-04-14 DIAGNOSIS — G5701 Lesion of sciatic nerve, right lower limb: Secondary | ICD-10-CM | POA: Diagnosis not present

## 2023-04-14 NOTE — Progress Notes (Signed)
PROVIDER NOTE: Information contained herein reflects review and annotations entered in association with encounter. Interpretation of such information and data should be left to medically-trained personnel. Information provided to patient can be located elsewhere in the medical record under "Patient Instructions". Document created using STT-dictation technology, any transcriptional errors that may result from process are unintentional.    Patient: Harold Sandoval  Service Category: E/M  Provider: Edward Jolly, MD  DOB: 1941/09/04  DOS: 04/14/2023  Referring Provider: Gracelyn Nurse, MD  MRN: [id number]  Specialty: Interventional Pain Management  PCP: Gracelyn Nurse, MD  Type: Established Patient  Setting: Ambulatory outpatient    Location: Office  Delivery: Face-to-face     HPI  [name], a 82 y.o. year old male, is here today because of his Chronic right SI joint pain [M53.3, G89.29]. Harold Sandoval primary complain today is Knee Pain (Left ) and Back Pain (Lumbar right )  Pertinent problems: Harold Sandoval has Thoracic radiculopathy; Osteoarthritis of left hip; Primary osteoarthritis of left hip; Degenerative arthritis of left knee; Primary osteoarthritis of left knee; Acute CVA (cerebrovascular accident) (HCC); and Chronic pain syndrome on their pertinent problem list. Pain Assessment: Severity of Chronic pain is reported as a 3 /10. Location: Knee (back lumbar right side.) Left/knee pain down left leg.. Onset: More than a month ago. Quality: Constant, Discomfort. Timing: Constant. Modifying factor(s): procedure did help the knee.  not as painful as last time he was here.. Vitals:  height is 6\' 4"  (1.93 m) and weight is 194 lb (88 kg). His temporal temperature is 98.3 F (36.8 C). His blood pressure is 116/78 and his pulse is 99. His respiration is 16 and oxygen saturation is 99%.  BMI: Estimated body mass index is 23.61 kg/m as calculated from the following:   Height as of this encounter:  6\' 4"  (1.93 m).   Weight as of this encounter: 194 lb (88 kg). Last encounter: 02/10/2023. Last procedure: 03/04/2023.  Reason for encounter: post-procedure evaluation and assessment.   Post-procedure evaluation   Procedure: Sacroiliac Joint Steroid Injection #1  & Right Piriformis TPI Laterality: Right     Level: PSIS (Posterior inferior iliac Spine)  Imaging: Fluoroscopic guidance Anesthesia: Local anesthesia (1-2% Lidocaine)  DOS: 03/04/2023  Performed by: Edward Jolly, MD  Purpose: Diagnostic/Therapeutic Indications: Sacroiliac joint pain in the lower back and hip area severe enough to impact quality of life or function. Rationale (medical necessity): procedure needed and proper for the diagnosis and/or treatment of Harold Sandoval medical symptoms and needs. 1. Chronic right SI joint pain   2. Chronic pain syndrome    NAS-11 Pain score:   Pre-procedure: 6 /10   Post-procedure: 6 /10      Effectiveness:  Initial hour after procedure: 100 %  Subsequent 4-6 hours post-procedure: 100 %  Analgesia past initial 6 hours: 70 %  Ongoing improvement:  Analgesic:  70% Function: Somewhat improved ROM: Harold Sandoval reports improvement in ROM   ROS  Constitutional: Denies any fever or chills Gastrointestinal: No reported hemesis, hematochezia, vomiting, or acute GI distress Musculoskeletal: Denies any acute onset joint swelling, redness, loss of ROM, or weakness Neurological: No reported episodes of acute onset apraxia, aphasia, dysarthria, agnosia, amnesia, paralysis, loss of coordination, or loss of consciousness  Medication Review  Vitamin D3, apixaban, atorvastatin, brimonidine, brinzolamide, desonide, fluticasone, gabapentin, latanoprost, loratadine, and metFORMIN  History Review  Allergy: Harold Sandoval is allergic to gadavist [gadobutrol] and lactose intolerance (gi). Drug: Harold Sandoval  reports no history  of drug use. Alcohol:  reports no history of alcohol use. Tobacco:   reports that he has never smoked. He has never used smokeless tobacco. Social: Harold Sandoval  reports that he has never smoked. He has never used smokeless tobacco. He reports that he does not drink alcohol and does not use drugs. 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 Family: family history includes Cancer in his father and mother.  Laboratory Chemistry Profile   Renal Lab Results  Component Value Date   BUN 16 01/17/2022   CREATININE 0.79 01/17/2022   GFRAA >60 02/26/2019   GFRNONAA >60 01/17/2022    Hepatic Lab Results  Component Value Date   AST 23 01/17/2022   ALT 17 01/17/2022   ALBUMIN 4.0 01/17/2022   ALKPHOS 100 01/17/2022   AMMONIA 9 09/06/2018    Electrolytes Lab Results  Component Value Date   NA 138 01/17/2022   K 3.8 01/17/2022   CL 103 01/17/2022   CALCIUM 9.1 01/17/2022    Bone No results found for: "VD25OH", "VD125OH2TOT", "RU0454UJ8", "JX9147WG9", "25OHVITD1", "25OHVITD2", "25OHVITD3", "TESTOFREE", "TESTOSTERONE"  Inflammation (CRP: Acute Phase) (ESR: Chronic Phase) No results found for: "CRP", "ESRSEDRATE", "LATICACIDVEN"       Note: Above Lab results  reviewed.  Recent Imaging Review  DG PAIN CLINIC C-ARM 1-60 MIN NO REPORT Fluoro was used, but no Radiologist interpretation will be provided.  Please refer to "NOTES" tab for provider progress note. Note: Reviewed        Physical Exam  General appearance: Well nourished, well developed, and well hydrated. In no apparent acute distress Mental status: Alert, oriented x 3 (person, place, & time)       Respiratory: No evidence of acute respiratory distress Eyes: PERLA Vitals: BP 116/78 (BP Location: Right Arm, Patient Position: Sitting, Cuff Size: Normal)   Pulse 99   Temp 98.3 F (36.8 C) (Temporal)   Resp 16   Ht 6\' 4"  (1.93 m)   Wt 194 lb (88 kg)   SpO2 99%   BMI 23.61 kg/m  BMI: Estimated body mass index is 23.61 kg/m as calculated from the following:   Height as of this encounter: 6\' 4"  (1.93 m).   Weight as of this encounter: 194 lb (88 kg). Ideal: Ideal body weight: 86.8 kg (191 lb 5.7 oz) Adjusted ideal body weight: 87.3 kg (192 lb 6.7 oz)  Improvement in right SI joint and right piriformis pain.  Assessment   Diagnosis Status  1. Chronic right SI joint pain   2. Piriformis syndrome of right side   3. Chronic pain syndrome    Controlled Controlled Controlled   Updated Problems: No problems updated.  Plan of Care  Excellent analgesic and functional response after right SI joint and piriformis injection, repeat as needed.  Follow-up plan:   Return for patient will call to schedule F2F appt prn.      Recent Visits Date Type  Provider Dept  03/04/23 Procedure visit Edward Jolly, MD [facility]  02/10/23 Office Visit Edward Jolly, MD [facility]  Showing recent visits within past 90 days and meeting all other requirements Today's Visits Date Type Provider Dept  04/14/23 Office Visit Edward Jolly, MD [facility]  Showing today's visits and meeting all other requirements Future Appointments No visits were found meeting these  conditions. Showing future appointments within next 90 days and meeting all other requirements  I discussed the assessment and treatment plan with the patient. The patient was provided an opportunity to ask questions and all were answered. The patient agreed with the plan and demonstrated an understanding of the instructions.  Patient advised to call back or seek an in-person evaluation if the symptoms or condition worsens.  Duration of encounter: .  Total time on encounter, as per AMA guidelines included both the face-to-face and non-face-to-face time personally spent by the physician and/or other qualified health care professional(s) on the day of the encounter (includes time in activities that require the physician or other qualified health care professional and does not include time in activities normally performed by clinical staff). Physician's time may include the following activities when performed: Preparing to see the patient (e.g., pre-charting review of records, searching for previously ordered imaging, lab work, and nerve conduction tests) Review of prior analgesic pharmacotherapies. Reviewing PMP Interpreting ordered tests (e.g., lab work, imaging, nerve conduction tests) Performing post-procedure evaluations, including interpretation of diagnostic procedures Obtaining and/or reviewing separately obtained history Performing a medically appropriate examination and/or evaluation Counseling and educating the patient/family/caregiver Ordering medications, tests, or procedures Referring and communicating with other health care professionals (when not separately reported) Documenting clinical information in the electronic or other health record Independently interpreting results (not separately reported) and communicating results to the patient/ family/caregiver Care coordination (not separately reported)  Note by: Edward Jolly, MD Date: 04/14/2023; Time: 10:48 AM

## 2023-04-14 NOTE — Progress Notes (Signed)
Safety precautions to be maintained throughout the outpatient stay will include: orient to surroundings, keep bed in low position, maintain call bell within reach at all times, provide assistance with transfer out of bed and ambulation.  

## 2023-12-23 ENCOUNTER — Other Ambulatory Visit: Payer: Self-pay | Admitting: Gastroenterology

## 2023-12-23 DIAGNOSIS — R634 Abnormal weight loss: Secondary | ICD-10-CM

## 2023-12-23 DIAGNOSIS — R1013 Epigastric pain: Secondary | ICD-10-CM

## 2024-01-04 ENCOUNTER — Ambulatory Visit: Admission: RE | Admit: 2024-01-04 | Payer: Medicare HMO | Source: Ambulatory Visit

## 2024-01-05 ENCOUNTER — Ambulatory Visit
Admission: RE | Admit: 2024-01-05 | Discharge: 2024-01-05 | Disposition: A | Discharge status: Home / Self Care | Payer: Medicare HMO | Source: Ambulatory Visit | Attending: Gastroenterology | Admitting: Gastroenterology

## 2024-01-05 DIAGNOSIS — R1013 Epigastric pain: Secondary | ICD-10-CM | POA: Insufficient documentation

## 2024-01-05 DIAGNOSIS — R634 Abnormal weight loss: Secondary | ICD-10-CM | POA: Diagnosis present

## 2024-01-05 LAB — POCT I-STAT CREATININE: Creatinine, Ser: 1 mg/dL (ref 0.61–1.24)

## 2024-01-05 MED ORDER — IOHEXOL 300 MG/ML  SOLN
85.0000 mL | Freq: Once | INTRAMUSCULAR | Status: AC | PRN
  Administered 2024-01-05: 85 mL via INTRAVENOUS

## 2024-07-15 ENCOUNTER — Emergency Department

## 2024-07-15 ENCOUNTER — Other Ambulatory Visit: Payer: Self-pay

## 2024-07-15 ENCOUNTER — Emergency Department
Admission: EM | Admit: 2024-07-15 | Discharge: 2024-07-15 | Disposition: A | Discharge status: Home / Self Care | Attending: Emergency Medicine | Admitting: Emergency Medicine

## 2024-07-15 DIAGNOSIS — R7989 Other specified abnormal findings of blood chemistry: Secondary | ICD-10-CM | POA: Insufficient documentation

## 2024-07-15 DIAGNOSIS — R0789 Other chest pain: Secondary | ICD-10-CM | POA: Diagnosis present

## 2024-07-15 DIAGNOSIS — Z7901 Long term (current) use of anticoagulants: Secondary | ICD-10-CM | POA: Diagnosis not present

## 2024-07-15 LAB — BASIC METABOLIC PANEL WITH GFR
Anion gap: 10 (ref 5–15)
BUN: 18 mg/dL (ref 8–23)
CO2: 27 mmol/L (ref 22–32)
Calcium: 9.9 mg/dL (ref 8.9–10.3)
Chloride: 106 mmol/L (ref 98–111)
Creatinine, Ser: 0.86 mg/dL (ref 0.61–1.24)
GFR, Estimated: >60 mL/min
Glucose, Bld: 130 mg/dL — ABNORMAL HIGH (ref 70–99)
Potassium: 4.2 mmol/L (ref 3.5–5.1)
Sodium: 143 mmol/L (ref 135–145)

## 2024-07-15 LAB — CBC
HCT: 47.4 % (ref 39.0–52.0)
Hemoglobin: 15.6 g/dL (ref 13.0–17.0)
MCH: 31.3 pg (ref 26.0–34.0)
MCHC: 32.9 g/dL (ref 30.0–36.0)
MCV: 95 fL (ref 80.0–100.0)
Platelets: 197 10*3/uL (ref 150–400)
RBC: 4.99 MIL/uL (ref 4.22–5.81)
RDW: 12.1 % (ref 11.5–15.5)
WBC: 6.7 10*3/uL (ref 4.0–10.5)
nRBC: 0 % (ref 0.0–0.2)

## 2024-07-15 LAB — TROPONIN I (HIGH SENSITIVITY)
Troponin I (High Sensitivity): 26 ng/L — ABNORMAL HIGH
Troponin I (High Sensitivity): 23 ng/L — ABNORMAL HIGH

## 2024-07-15 NOTE — Discharge Instructions (Signed)

## 2024-07-15 NOTE — ED Triage Notes (Signed)
 Pt reports chest pain that began earlier in the afternoon yesterday, pt denies accompanying symptoms. Pt denies cardiac hx.

## 2024-07-15 NOTE — ED Provider Notes (Signed)
 [facility] Provider Note    Event Date/Time   First MD Initiated Contact with Patient 07/15/24 [phone]     (approximate)   History   Chest Pain   HPI Harold Sandoval is a 83 y.o. male whose past medical history includes prior CVA on chronic Eliquis .  He has had a loop recorder placed in the past that is still in place.  He presents for evaluation of about 12 hours of persistent left-sided sharp chest pain.  He said that it started while he was watching a movie and he feels a sharp pain around his left nipple about every 4 to 5 minutes.  He can reproduce it by pushing on the area as well.  No shortness of breath.  No nausea or vomiting.  No recent fever.  No history of similar pain.  He has been compliant with his medication regimen including his Eliquis .     Physical Exam   Triage Vital Signs: ED Triage Vitals  Encounter Vitals Group     BP 07/15/24 0106 135/85     Girls Systolic BP Percentile --      Girls Diastolic BP Percentile --      Boys Systolic BP Percentile --      Boys Diastolic BP Percentile --      Pulse Rate 07/15/24 0106 84     Resp 07/15/24 0106 19     Temp 07/15/24 0106 (!) 97.5 F (36.4 C)     Temp Source 07/15/24 0106 Oral     SpO2 07/15/24 0106 99 %     Weight 07/15/24 0105 81.6 kg (180 lb)     Height 07/15/24 0105 1.93 m (6' 4)     Head Circumference --      Peak Flow --      Pain Score 07/15/24 0105 6     Pain Loc --      Pain Education --      Exclude from Growth Chart --     Most recent vital signs: Vitals:   07/15/24 0106  BP: 135/85  Pulse: 84  Resp: 19  Temp: (!) 97.5 F (36.4 C)  SpO2: 99%    General: Awake, no obvious distress.  Generally well-appearing despite his chronic medical issues and age. CV:  Good peripheral perfusion.  Normal heart sounds, regular rate and rhythm. Resp:  Normal effort. Speaking easily and comfortably, no accessory muscle usage nor intercostal retractions.  Lungs are clear to  auscultation bilaterally. Abd:  No distention.    ED Results / Procedures / Treatments   Labs (all labs ordered are listed, but only abnormal results are displayed) Labs Reviewed  BASIC METABOLIC PANEL WITH GFR - Abnormal; Notable for the following components:      Result Value   Glucose, Bld 130 (*)    All other components within normal limits  TROPONIN I (HIGH SENSITIVITY) - Abnormal; Notable for the following components:   Troponin I (High Sensitivity) 26 (*)    All other components within normal limits  TROPONIN I (HIGH SENSITIVITY) - Abnormal; Notable for the following components:   Troponin I (High Sensitivity) 23 (*)    All other components within normal limits  CBC     EKG  ED ECG REPORT I, Darleene Dome, the attending physician, personally viewed and interpreted this ECG.  Date: 07/15/2024 EKG Time: 01:07 Rate: 87 Rhythm: normal sinus rhythm QRS Axis: normal Intervals: normal ST/T Wave abnormalities: normal Narrative Interpretation: no evidence of  acute ischemia    RADIOLOGY I independently viewed and interpreted the patient's two-view chest x-ray and I see no evidence of pneumonia nor pneumothorax.  I also read the radiologist's report, which confirmed no acute findings.   PROCEDURES:  Critical Care performed: No  Procedures    IMPRESSION / MDM / ASSESSMENT AND PLAN / ED COURSE  I reviewed the triage vital signs and the nursing notes.                              Differential diagnosis includes, but is not limited to, musculoskeletal pain, ACS, angina, pneumothorax, pneumonia, PE.  Patient's presentation is most consistent with acute presentation with potential threat to life or bodily function.  Labs/studies ordered: Two-view chest x-ray, EKG, BMP, high-sensitivity troponin x 2, CBC  Interventions/Medications given:  Medications - No data to display  (Note:  hospital course my include additional interventions and/or labs/studies not listed  above.)   Low risk ACS based HEAR score and physical exam and signs/symptoms are not consistent with classic ACS.  Pain is quite reproducible to palpation of the left anterior chest wall.  No ischemia on EKG.  However The initial high-sensitivity troponin is slightly elevated at 26.  No renal dysfunction.  No abdominal tenderness to palpation.  Reassuring chest x-ray.  Patient is eager to go home despite the ongoing intermittent and reproducible pain.  I recommended we at least check a second high-sensitivity troponin to make sure he is not trending up.  He and his wife agree with the plan.  I anticipate he will want to be discharged and follow-up as an outpatient but I will reassess after her labs are ready.  The patient is on the cardiac monitor to evaluate for evidence of arrhythmia and/or significant heart rate changes.   Clinical Course as of 07/15/24 0556  Fri Jul 15, 2024  0337 Hemoglobin: 15.6 [EY]  0554 Patient's troponin essentially unchanged.  He is still having intermittent pain particularly when he pushes on the area or moves.  I again offered admission versus close outpatient follow-up and he and his wife would prefer to go home.  I think this is totally reasonable and appropriate given no evidence of an emergent condition at this time but they both understand the importance of follow-up and return to the ED should he develop new or worsening symptoms. [CF]    Clinical Course User Index [CF] Gordan Huxley, MD [EY] Caryl Stanford, [place]     FINAL CLINICAL IMPRESSION(S) / ED DIAGNOSES   Final diagnoses:  Atypical chest pain  Elevated troponin level     Rx / DC Orders   ED Discharge Orders          Ordered    Ambulatory referral to Cardiology       Comments: If you have not heard from the Cardiology office within the next 72 hours please call [phone].   07/15/24 0555             Note:  This document was prepared using Dragon voice recognition software  and may include unintentional dictation errors.   Gordan Huxley, MD 07/15/24 [phone]

## 2024-07-15 NOTE — ED Notes (Signed)
 Blue top sent to the lab with blood work at this time.

## 2024-08-19 ENCOUNTER — Telehealth: Payer: Self-pay | Admitting: Internal Medicine

## 2024-08-19 NOTE — Telephone Encounter (Signed)
 Called pt and left a voice message to see if Pt is with [facility] or Not. I left a message to just let us  know .

## 2024-08-19 NOTE — Telephone Encounter (Signed)
-----   Message from CMA Marina  L sent at 08/18/2024  1:57 PM EDT ----- Pt has follow up as new pt. Pt will be seeing [facility]. Pt hx of [facility] last 12/2023 told to follow up in 12 months. Please advise if pt is switching to [facility] or is continuing care with [facility].  Thank you,  Marina , CMA

## 2024-08-24 ENCOUNTER — Ambulatory Visit: Admitting: Cardiology

## 2024-09-02 ENCOUNTER — Encounter: Payer: Self-pay | Admitting: Emergency Medicine

## 2024-09-02 ENCOUNTER — Emergency Department

## 2024-09-02 ENCOUNTER — Emergency Department
Admission: EM | Admit: 2024-09-02 | Discharge: 2024-09-02 | Disposition: A | Discharge status: Home / Self Care | Source: Ambulatory Visit | Attending: Emergency Medicine | Admitting: Emergency Medicine

## 2024-09-02 ENCOUNTER — Other Ambulatory Visit: Payer: Self-pay

## 2024-09-02 DIAGNOSIS — R42 Dizziness and giddiness: Secondary | ICD-10-CM | POA: Insufficient documentation

## 2024-09-02 DIAGNOSIS — R519 Headache, unspecified: Secondary | ICD-10-CM | POA: Insufficient documentation

## 2024-09-02 DIAGNOSIS — Z8616 Personal history of COVID-19: Secondary | ICD-10-CM | POA: Insufficient documentation

## 2024-09-02 DIAGNOSIS — E119 Type 2 diabetes mellitus without complications: Secondary | ICD-10-CM | POA: Insufficient documentation

## 2024-09-02 DIAGNOSIS — H538 Other visual disturbances: Secondary | ICD-10-CM | POA: Insufficient documentation

## 2024-09-02 LAB — COMPREHENSIVE METABOLIC PANEL WITH GFR
ALT: 13 U/L (ref 0–44)
AST: 20 U/L (ref 15–41)
Albumin: 3.9 g/dL (ref 3.5–5.0)
Alkaline Phosphatase: 74 U/L (ref 38–126)
Anion gap: 14 (ref 5–15)
BUN: 10 mg/dL (ref 8–23)
CO2: 27 mmol/L (ref 22–32)
Calcium: 9.8 mg/dL (ref 8.9–10.3)
Chloride: 103 mmol/L (ref 98–111)
Creatinine, Ser: 0.89 mg/dL (ref 0.61–1.24)
GFR, Estimated: >60 mL/min
Glucose, Bld: 145 mg/dL — ABNORMAL HIGH (ref 70–99)
Potassium: 3.8 mmol/L (ref 3.5–5.1)
Sodium: 144 mmol/L (ref 135–145)
Total Bilirubin: 1 mg/dL (ref 0.0–1.2)
Total Protein: 6.6 g/dL (ref 6.5–8.1)

## 2024-09-02 LAB — CBC
HCT: 45.4 % (ref 39.0–52.0)
Hemoglobin: 14.6 g/dL (ref 13.0–17.0)
MCH: 30.3 pg (ref 26.0–34.0)
MCHC: 32.2 g/dL (ref 30.0–36.0)
MCV: 94.2 fL (ref 80.0–100.0)
Platelets: 213 10*3/uL (ref 150–400)
RBC: 4.82 MIL/uL (ref 4.22–5.81)
RDW: 11.9 % (ref 11.5–15.5)
WBC: 6.3 10*3/uL (ref 4.0–10.5)
nRBC: 0 % (ref 0.0–0.2)

## 2024-09-02 LAB — URINALYSIS, ROUTINE W REFLEX MICROSCOPIC
Bacteria, UA: NONE SEEN
Bilirubin Urine: NEGATIVE
Glucose, UA: >=500 mg/dL — AB
Ketones, ur: NEGATIVE mg/dL
Leukocytes,Ua: NEGATIVE
Nitrite: NEGATIVE
Protein, ur: NEGATIVE mg/dL
Specific Gravity, Urine: 1.031 — ABNORMAL HIGH (ref 1.005–1.030)
pH: 5 (ref 5.0–8.0)

## 2024-09-02 NOTE — Discharge Instructions (Addendum)
 Please call and schedule follow-up appointment with your primary care provider.  If you develop any symptoms of concern such as return of dizziness, vision changes, weakness, or headache return to the emergency department immediately.

## 2024-09-02 NOTE — ED Triage Notes (Signed)
 First nurse note: pt to ED from [name] for headache noticed this am, +dizziness.

## 2024-09-02 NOTE — ED Triage Notes (Signed)
 Patient to ED from [facility] for dizziness and headache. PT reports having covid 15 days ago. Denies pain at this time.

## 2024-09-02 NOTE — ED Provider Notes (Signed)
 [facility] Provider Note    Event Date/Time   First MD Initiated Contact with Patient 09/02/24 1717     (approximate)   History   Dizziness   HPI  Harold Sandoval is a 83 y.o. male with history of CVA, diabetes, and as listed in EMR presents to the emergency department for treatment and evaluation of dizziness and headache after having COVID 15 days ago.  Patient felt that he had completely recovered from COVID but then this morning when he got up he noticed that he was a little dizzy and vision seem to be dim in both eyes.  Unknown last known well time as these symptoms were present upon awakening. Symptoms lasted for about 2 hours.  He went to urgent care but was advised he needed to come to the emergency department.  Symptoms have completely resolved and he denies dizziness, vision changes, or headache at this time.   Physical Exam    Vitals:   09/02/24 1434  BP: 125/66  Pulse: 93  Resp: 17  Temp: 97.7 F (36.5 C)  SpO2: 97%    General: Awake, no distress.  CV:  Good peripheral perfusion.  Resp:  Normal effort.  Abd:  No distention.  Other:  Ambulatory without assistance.   No focal neurological abnormality.   ED Results / Procedures / Treatments   Labs (all labs ordered are listed, but only abnormal results are displayed)  Labs Reviewed  COMPREHENSIVE METABOLIC PANEL WITH GFR - Abnormal; Notable for the following components:      Result Value   Glucose, Bld 145 (*)    All other components within normal limits  URINALYSIS, ROUTINE W REFLEX MICROSCOPIC - Abnormal; Notable for the following components:   Color, Urine YELLOW (*)    APPearance CLEAR (*)    Specific Gravity, Urine 1.031 (*)    Glucose, UA >=500 (*)    Hgb urine dipstick SMALL (*)    All other components within normal limits  CBC  CBG MONITORING, ED     EKG  Normal sinus rhythm with a rate of 91 with occasional PVCs.   RADIOLOGY  Image and radiology report  reviewed and interpreted by me. Radiology report consistent with the same.  CT head negative for acute concerns.  PROCEDURES:  Critical Care performed: No  Procedures   MEDICATIONS ORDERED IN ED:  Medications - No data to display   IMPRESSION / MDM / ASSESSMENT AND PLAN / ED COURSE   I have reviewed the triage note and vital signs. Vital signs stable.   Differential diagnosis includes, but is not limited to, COVID, TIA, CVA, vertigo  Patient's presentation is most consistent with acute illness / injury with system symptoms.  83 year old male presents to the emergency department for treatment and evaluation of dizziness and dim vision that was present upon awakening this morning. Symptoms have resolved.  While awaiting ER room assignment, labs were drawn. CBC is normal. CMP is reassuring as well. Urinalysis shows large amount of glucose and small amount of hemoglobin, but otherwise negative for concerns.    Head CT negative for acute concerns.  Results discussed with the patient.  We discussed further imaging with brain MR.  Patient declined and states that his symptoms resolved hours ago and he does not wish to stay any longer.  He was advised that stroke cannot be completely ruled out without image however he again declines and states that he will return if his symptoms  return.  Patient is capable of making his own decisions and was released as requested.        FINAL CLINICAL IMPRESSION(S) / ED DIAGNOSES   Final diagnoses:  Dizziness     Rx / DC Orders   ED Discharge Orders     None        Note:  This document was prepared using Dragon voice recognition software and may include unintentional dictation errors.   Herlinda Kirk NOVAK, FNP 09/02/24 1947    Floy Roberts, MD 09/03/24 [phone]

## 2024-09-16 ENCOUNTER — Other Ambulatory Visit: Payer: Self-pay | Admitting: Internal Medicine

## 2024-09-16 DIAGNOSIS — Z809 Family history of malignant neoplasm, unspecified: Secondary | ICD-10-CM

## 2024-09-16 DIAGNOSIS — Z7689 Persons encountering health services in other specified circumstances: Secondary | ICD-10-CM

## 2024-09-16 DIAGNOSIS — R634 Abnormal weight loss: Secondary | ICD-10-CM

## 2024-09-20 ENCOUNTER — Ambulatory Visit
Admission: RE | Admit: 2024-09-20 | Discharge: 2024-09-20 | Disposition: A | Discharge status: Home / Self Care | Source: Ambulatory Visit | Attending: Internal Medicine | Admitting: Internal Medicine

## 2024-09-20 DIAGNOSIS — Z7689 Persons encountering health services in other specified circumstances: Secondary | ICD-10-CM | POA: Insufficient documentation

## 2024-09-20 DIAGNOSIS — R634 Abnormal weight loss: Secondary | ICD-10-CM | POA: Insufficient documentation

## 2024-09-20 DIAGNOSIS — Z809 Family history of malignant neoplasm, unspecified: Secondary | ICD-10-CM | POA: Insufficient documentation

## 2024-09-20 MED ORDER — IOHEXOL 300 MG/ML  SOLN
85.0000 mL | Freq: Once | INTRAMUSCULAR | Status: AC | PRN
  Administered 2024-09-20: 85 mL via INTRAVENOUS

## 2024-10-06 ENCOUNTER — Other Ambulatory Visit: Payer: Self-pay | Admitting: Family Medicine

## 2024-10-06 DIAGNOSIS — R634 Abnormal weight loss: Secondary | ICD-10-CM

## 2024-10-06 DIAGNOSIS — R1013 Epigastric pain: Secondary | ICD-10-CM

## 2024-10-07 ENCOUNTER — Ambulatory Visit
Admission: RE | Admit: 2024-10-07 | Discharge: 2024-10-07 | Disposition: A | Discharge status: Home / Self Care | Source: Ambulatory Visit | Attending: Family Medicine | Admitting: Family Medicine

## 2024-10-07 DIAGNOSIS — R634 Abnormal weight loss: Secondary | ICD-10-CM | POA: Insufficient documentation

## 2024-10-07 DIAGNOSIS — R1013 Epigastric pain: Secondary | ICD-10-CM | POA: Insufficient documentation
